# Patient Record
Sex: Male | Born: 1955 | Race: Black or African American | Hispanic: No | Smoking: Former smoker
Health system: Southern US, Community
[De-identification: ages and names within clinical notes are randomized; demographics above are authoritative.]

## PROBLEM LIST (undated history)

## (undated) DIAGNOSIS — M549 Dorsalgia, unspecified: Secondary | ICD-10-CM

## (undated) DIAGNOSIS — E785 Hyperlipidemia, unspecified: Secondary | ICD-10-CM

## (undated) DIAGNOSIS — E669 Obesity, unspecified: Secondary | ICD-10-CM

## (undated) DIAGNOSIS — N400 Enlarged prostate without lower urinary tract symptoms: Secondary | ICD-10-CM

## (undated) DIAGNOSIS — G4730 Sleep apnea, unspecified: Secondary | ICD-10-CM

## (undated) DIAGNOSIS — M109 Gout, unspecified: Secondary | ICD-10-CM

## (undated) DIAGNOSIS — R42 Dizziness and giddiness: Secondary | ICD-10-CM

## (undated) DIAGNOSIS — I10 Essential (primary) hypertension: Secondary | ICD-10-CM

## (undated) HISTORY — PX: BACK SURGERY: SHX140

## (undated) HISTORY — DX: Sleep apnea, unspecified: G47.30

---

## 2006-04-19 ENCOUNTER — Ambulatory Visit: Payer: Self-pay | Admitting: Psychiatry

## 2006-04-19 ENCOUNTER — Encounter: Payer: Self-pay | Admitting: Emergency Medicine

## 2006-04-19 ENCOUNTER — Inpatient Hospital Stay (HOSPITAL_COMMUNITY): Admission: RE | Admit: 2006-04-19 | Discharge: 2006-04-21 | Payer: Self-pay | Admitting: Psychiatry

## 2008-06-25 ENCOUNTER — Ambulatory Visit: Payer: Self-pay | Admitting: Internal Medicine

## 2008-06-25 ENCOUNTER — Ambulatory Visit: Payer: Self-pay | Admitting: Cardiology

## 2008-06-25 ENCOUNTER — Observation Stay (HOSPITAL_COMMUNITY): Admission: EM | Admit: 2008-06-25 | Discharge: 2008-06-27 | Payer: Self-pay | Admitting: Emergency Medicine

## 2008-06-26 ENCOUNTER — Encounter (INDEPENDENT_AMBULATORY_CARE_PROVIDER_SITE_OTHER): Payer: Self-pay | Admitting: Internal Medicine

## 2010-11-22 NOTE — Discharge Summary (Signed)
[name], PAINO NO.:  [id number]   MEDICAL RECORD NO.:  [id number]          PATIENT TYPE:  OBV   LOCATION:  6529                         FACILITY:  [facility]   PHYSICIAN:  Chauncey Reading, D.O.  DATE OF BIRTH:  [birth year]   DATE OF ADMISSION:  06/25/2008  DATE OF DISCHARGE:  06/27/2008                               DISCHARGE SUMMARY   DIAGNOSES AT DISCHARGE:  1. Atypical chest pain.  2. Hypertension.  3. Hyperlipidemia.  4. Questionable prediabetes.  5. Gout.   MEDICATIONS AT DISCHARGE:  1. Caduet 10/20 mg p.o. daily.  2. Aspirin 81 mg p.o. daily.  3. Prilosec 20 mg p.o. daily.   DISPOSITION AND FOLLOWUP:  The patient was advised to call his primary  care doctor, Dr. Yetta Flock and arrange an appointment for hospital  followup.  The patient will need to be reassessed as far as his chest  pain goes.  We thought that this was likely secondary to hiatal hernia  and possible GERD given positional nature, pain reoccurring while laying  in bed.  The patient also had a CT angiogram of his chest that showed a  small hiatal hernia that may be contributing.  The patient was  discharged on Prilosec to see if symptoms improve.  The patient was also  advised to take a daily aspirin even though he has got a recent stress  echo that was negative and was ruled out for acute coronary syndrome in  the hospital.  Furthermore, the patient should have his blood pressure  checked and make sure it is within goal.  Of note, the patient had a  hemoglobin A1c done in the hospital that was 6.0, a lipid panel that had  a total cholesterol of 142, triglycerides 119, HDL 35, and LDL of 83.  The patient should also followup with his rheumatologist that he had  been referred to by Dr. Yetta Flock himself, regarding the difficulty in  management of his gout of the left knee.   CONSULTATIONS:  None.   PROCEDURES:  The patient had a CT angiogram of the chest done June 26, 2008,  Impression;  1. No evidence of pulmonary emboli.  2. No abnormality of thoracic aorta.  3. Small hiatal hernia.   HISTORY AND PHYSICAL:  Ricky Bailey is a 55 year old male with past  medical history significant for hypertension, hyperlipidemia, and  questionable prediabetes who presents to the emergency department with  atypical chest pain.  For the past week and a half or so, he describes  chest pain only while laying down.  He described a pressure-like pain in  bilateral chest, worse with laying on either side.  Pain becomes more  substernal while laying on side.  The pain is not associated with any  other symptoms and is relieved by sitting or getting up.  Pain never  occurs while in an upright position.  He denies any other symptoms or  complaints.  Of note, he had a treadmill stress test, had a negative  stress echocardiogram earlier this year that was negative per patient  and it confirmed negative with his  PCP.   VITAL SIGNS:  On admission, blood pressure 153/94, heart rate 81,  respirations 16, temperature 98 degrees, and O2 sat 99% on room air.  GENERAL:  The patient was in no acute distress.  HEENT:  Sclerae was anicteric.  Pupils equal, round, and reactive to  light and accommodation.  Extraocular movements intact.  No conjunctival  injection.  NECK:  Supple, no bruits.  No lymphadenopathy.  No jugular venous  distention.  CARDIOVASCULAR:  Positive S1 and S2 with a regular rate and rhythm.  No  murmurs, gallops, or rubs.  LUNGS:  Clear to auscultation bilaterally.  Good air movement.  ABDOMEN:  Positive bowel sounds.  Soft, nontender, nondistended without  any guarding or rebound.  SKIN:  No significant rashes.  EXTREMITIES:  No lower extremity edema.  MUSCULOSKELETAL:  No calf tenderness.  No negative Homans sign.  NEUROLOGIC:  The patient was alert and oriented x3.  Cranial nerves II  through XII intact.  Motor 5/5 x4 extremities.  SENSORY:  Grossly intact.  Gait  normal.  PSYCHIATRIC:  The patient was pleasant and appropriate.   HOSPITAL COURSE:  1. Atypical chest pain.  Although atypical, the patient did have      plenty of cardiovascular risk factors including hypertension,      hyperlipidemia, and questionable prediabetes.  The patient was      admitted to a telemetry and ruled out for acute coronary syndrome.      EKGs remained unchanged throughout the hospitalization and cardiac      enzymes that were cycled were negative x3.  The patient did not      have any events on telemetry overnight.  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  Initial D-      dimer was 0.79 which is probably secondary to his gout and      inflammatory process.  However, given his unexplained chest pain      with an elevated D-dimer in the setting of a risk factor, we      proceeded with a CT angiogram of the chest which ruled out a      pulmonary emboli.  The CT angio also ruled out any thoracic aortic      pathology to explain the chest pain.  Furthermore, no lung or chest      pathology found to explain the chest pain except for a small hiatal      hernia which may be explaining the patient's symptoms along with      GERD.  The patient was continued on his home medications including      a calcium channel blocker and statins.  He was also placed on      aspirin here.  The patient was given Protonix for probable GERD.      The patient also had a 2-D echocardiogram with a normal left      ventricular function and an ejection fraction estimated at 55%.  Of      note, it was mentioned that the study was not adequate enough to      assess her wall motion abnormalities.  The patient was discharged      once all the life threatening causes of chest pain had been ruled      out.  Dr. Yetta Flock was called since  the patient reported having a      treadmill stress test  done earlier in the year.  Dr. Yetta Flock      confirmed that he had a stress echo done in March or April of 2009      and was negative for any ischemia.  The patient was discharged on      his home medications include Caduet and recommended daily aspirin      81 mg along with omeprazole for possible GERD.  He was also advised      to keep the head of the bed elevated and to not eat any meals      within 2-3 hours of his bedtime.  2. Hypertension.  He was continued on his home medications during the      hospitalization.  The patient is to have his blood pressure      reassessed on an outpatient basis to assess the need for further      adjustment.  3. Hyperlipidemia.  The patient had a fasting lipid panel done in the      hospital which included total cholesterol 142, triglycerides of      119, HDL of 35, and LDL of 83.  Given the patient's risk factors,      LDL is appropriate though a slight increase in the statin and      slight decrease in the LDL to 70 could be appropriate.  4. Questionable prediabetes.  The patient's hemoglobin A1c was checked      to assess for possible diabetes.  It was noted to be 6.0 which is      well within normal range.  The patient should have a continued      outpatient followup for possible development of diabetes.  5. Gout.  The patient did not have any gout flare during this      hospitalization.  His knee was noted to be slightly swollen, boggy,      and warm compared to the rest of the areas though there was no      exclusive tenderness.  The patient's PCP was contacted regarding      rheumatologic workup since the patient was not clear as to what was      going on.  Dr. Yetta Flock mentioned that the patient's knee has been      tapped on 2 different occasions, and uric acid and everything else      have been checked and it is consistent with gout.  The patient had      been referred to rheumatologist for difficulty controlling the gout      since he has  been tried on prednisone, indomethacin, allopurinol as      well as colchicine and neither of the combinations were able to      control his gout.  The patient's rheumatology appointment is      pending and the patient was advised to followup with that.      Mariea Stable, MD  Electronically Signed      Chauncey Reading, D.O.  Electronically Signed    MA/MEDQ  D:  06/27/2008  T:  06/28/2008  Job:  [id number]
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
## 2011-04-14 LAB — BASIC METABOLIC PANEL
BUN: 10 mg/dL (ref 6–23)
Chloride: 104 mEq/L (ref 96–112)
Creatinine, Ser: 0.81 mg/dL (ref 0.4–1.5)
Glucose, Bld: 108 mg/dL — ABNORMAL HIGH (ref 70–99)

## 2011-04-14 LAB — CK TOTAL AND CKMB (NOT AT ARMC)
CK, MB: 1.5 ng/mL (ref 0.3–4.0)
Relative Index: 1 (ref 0.0–2.5)

## 2011-04-14 LAB — LIPID PANEL
Cholesterol: 142 mg/dL (ref 0–200)
HDL: 35 mg/dL — ABNORMAL LOW (ref >39)
LDL Cholesterol: 83 mg/dL (ref 0–99)
Total CHOL/HDL Ratio: 4.1 RATIO
Triglycerides: 119 mg/dL (ref <150)

## 2011-04-14 LAB — DIFFERENTIAL
Lymphocytes Relative: 37 % (ref 12–46)
Lymphs Abs: 2.2 10*3/uL (ref 0.7–4.0)
Monocytes Relative: 8 % (ref 3–12)
Neutro Abs: 3.2 10*3/uL (ref 1.7–7.7)
Neutrophils Relative %: 53 % (ref 43–77)

## 2011-04-14 LAB — COMPREHENSIVE METABOLIC PANEL
Albumin: 4.3 g/dL (ref 3.5–5.2)
BUN: 9 mg/dL (ref 6–23)
Chloride: 105 mEq/L (ref 96–112)
Creatinine, Ser: 0.75 mg/dL (ref 0.4–1.5)
Glucose, Bld: 109 mg/dL — ABNORMAL HIGH (ref 70–99)
Total Bilirubin: 1.8 mg/dL — ABNORMAL HIGH (ref 0.3–1.2)
Total Protein: 7.6 g/dL (ref 6.0–8.3)

## 2011-04-14 LAB — HEMOGLOBIN A1C
Hgb A1c MFr Bld: 6 % (ref 4.6–6.1)
Mean Plasma Glucose: 126 mg/dL

## 2011-04-14 LAB — CBC
Hemoglobin: 13.5 g/dL (ref 13.0–17.0)
MCHC: 33.8 g/dL (ref 30.0–36.0)
MCV: 87.5 fL (ref 78.0–100.0)
Platelets: 269 10*3/uL (ref 150–400)
RBC: 4.58 MIL/uL (ref 4.22–5.81)
RDW: 14.2 % (ref 11.5–15.5)

## 2011-04-14 LAB — POCT I-STAT, CHEM 8
Chloride: 105 mEq/L (ref 96–112)
Creatinine, Ser: 0.9 mg/dL (ref 0.4–1.5)
Glucose, Bld: 114 mg/dL — ABNORMAL HIGH (ref 70–99)
Potassium: 3.4 mEq/L — ABNORMAL LOW (ref 3.5–5.1)

## 2011-04-14 LAB — CARDIAC PANEL(CRET KIN+CKTOT+MB+TROPI)
Total CK: 128 U/L (ref 7–232)
Troponin I: <0.01 ng/mL (ref 0.00–0.06)
Troponin I: <0.01 ng/mL (ref 0.00–0.06)

## 2011-04-14 LAB — POCT CARDIAC MARKERS
CKMB, poc: 1.1 ng/mL (ref 1.0–8.0)
Troponin i, poc: <0.05 ng/mL (ref 0.00–0.09)

## 2011-04-14 LAB — D-DIMER, QUANTITATIVE: D-Dimer, Quant: 0.79 ug/mL-FEU — ABNORMAL HIGH (ref 0.00–0.48)

## 2011-04-14 LAB — TSH: TSH: 1.122 u[IU]/mL (ref 0.350–4.500)

## 2011-06-15 ENCOUNTER — Ambulatory Visit: Payer: Self-pay

## 2012-06-19 ENCOUNTER — Emergency Department (HOSPITAL_COMMUNITY)
Admission: EM | Admit: 2012-06-19 | Discharge: 2012-06-19 | Disposition: A | Discharge status: Home / Self Care | Payer: Worker's Compensation | Attending: Emergency Medicine | Admitting: Emergency Medicine

## 2012-06-19 ENCOUNTER — Encounter (HOSPITAL_COMMUNITY): Payer: Self-pay | Admitting: Family Medicine

## 2012-06-19 ENCOUNTER — Emergency Department (HOSPITAL_COMMUNITY): Payer: Worker's Compensation

## 2012-06-19 DIAGNOSIS — Y9389 Activity, other specified: Secondary | ICD-10-CM | POA: Insufficient documentation

## 2012-06-19 DIAGNOSIS — Y929 Unspecified place or not applicable: Secondary | ICD-10-CM | POA: Insufficient documentation

## 2012-06-19 DIAGNOSIS — I10 Essential (primary) hypertension: Secondary | ICD-10-CM | POA: Insufficient documentation

## 2012-06-19 DIAGNOSIS — Z87891 Personal history of nicotine dependence: Secondary | ICD-10-CM | POA: Insufficient documentation

## 2012-06-19 DIAGNOSIS — S239XXA Sprain of unspecified parts of thorax, initial encounter: Secondary | ICD-10-CM | POA: Insufficient documentation

## 2012-06-19 DIAGNOSIS — E785 Hyperlipidemia, unspecified: Secondary | ICD-10-CM | POA: Insufficient documentation

## 2012-06-19 DIAGNOSIS — T148XXA Other injury of unspecified body region, initial encounter: Secondary | ICD-10-CM

## 2012-06-19 DIAGNOSIS — W010XXA Fall on same level from slipping, tripping and stumbling without subsequent striking against object, initial encounter: Secondary | ICD-10-CM | POA: Insufficient documentation

## 2012-06-19 HISTORY — DX: Hyperlipidemia, unspecified: E78.5

## 2012-06-19 HISTORY — DX: Essential (primary) hypertension: I10

## 2012-06-19 MED ORDER — IBUPROFEN 800 MG PO TABS
800.0000 mg | ORAL_TABLET | Freq: Once | ORAL | Status: AC
  Administered 2012-06-19: 800 mg via ORAL
  Filled 2012-06-19: qty 1

## 2012-06-19 MED ORDER — HYDROCODONE-ACETAMINOPHEN 5-325 MG PO TABS: 2.0000 | ORAL_TABLET | ORAL | Status: DC | PRN

## 2012-06-19 MED ORDER — IBUPROFEN 800 MG PO TABS: 800.0000 mg | ORAL_TABLET | Freq: Three times a day (TID) | ORAL | Status: DC

## 2012-06-19 MED ORDER — HYDROCODONE-ACETAMINOPHEN 5-325 MG PO TABS
2.0000 | ORAL_TABLET | Freq: Once | ORAL | Status: AC
  Administered 2012-06-19: 2 via ORAL
  Filled 2012-06-19: qty 2

## 2012-06-19 NOTE — ED Notes (Signed)
Pt presents with lower back pain and left-sided rib pain. Pt reports slipping on ice while getting out of a truck and caught himself with the door.  Pt denies numbness/tingling, denies bowel/bladder incontinence.  Pt c/o 7/10 back pain and 7/10 rib pain on inspiration. Pt denies SOB at this time.

## 2012-06-19 NOTE — ED Notes (Signed)
Patient transported to X-ray 

## 2012-06-19 NOTE — ED Provider Notes (Signed)
History     CSN: [id number]  Arrival date & time 06/19/12  0550   None     Chief Complaint  Patient presents with  . Back Pain    (Consider location/radiation/quality/duration/timing/severity/associated sxs/prior treatment) Patient is a 56 y.o. male presenting with back pain. The history is provided by the patient. No language interpreter was used.  Back Pain  This is a new problem. The problem occurs constantly. The problem has been gradually worsening. The pain is associated with falling. The pain is present in the thoracic spine. The quality of the pain is described as aching. The pain is at a severity of 6/10. The pain is moderate. The symptoms are aggravated by bending. The pain is the same all the time. Stiffness is present all day. Associated symptoms include chest pain. He has tried nothing for the symptoms.  Pt reports he slipped on ice getting into his truck,  Pt reports he caught himself and had a pulling sensation in chest and back  Past Medical History  Diagnosis Date  . Hypertension   . Hyperlipidemia     History reviewed. No pertinent past surgical history.  No family history on file.  History  Substance Use Topics  . Smoking status: Former Smoker    Quit date: 07/21/1977  . Smokeless tobacco: Not on file  . Alcohol Use: 3.6 oz/week    6 Cans of beer per week      Review of Systems  Cardiovascular: Positive for chest pain.  Musculoskeletal: Positive for back pain.  All other systems reviewed and are negative.    Allergies  Review of patient's allergies indicates no known allergies.  Home Medications   Current Outpatient Rx  Name  Route  Sig  Dispense  Refill  . AMLODIPINE-ATORVASTATIN 10-40 MG PO TABS   Oral   Take 1 tablet by mouth daily.           BP 123/78  Pulse 62  Temp 97.3 F (36.3 C) (Oral)  Resp 20  SpO2 100%  Physical Exam  Vitals reviewed. Constitutional: He is oriented to person, place, and time. He appears  well-developed and well-nourished.  HENT:  Head: Normocephalic.  Right Ear: External ear normal.  Eyes: Conjunctivae normal are normal. Pupils are equal, round, and reactive to light.  Neck: Normal range of motion. Neck supple.  Cardiovascular: Normal rate and normal heart sounds.   Pulmonary/Chest: Effort normal and breath sounds normal.  Abdominal: Soft.  Musculoskeletal: Normal range of motion.  Neurological: He is alert and oriented to person, place, and time. He has normal reflexes.  Skin: Skin is warm.  Psychiatric: He has a normal mood and affect.    ED Course  Procedures (including critical care time)  Labs Reviewed - No data to display No results found.   No diagnosis found.    MDM      Chest xray no acute    Lonia Skinner [place], [place] 06/19/12 [phone]

## 2012-06-19 NOTE — ED Provider Notes (Signed)
Medical screening examination/treatment/procedure(s) were performed by non-physician practitioner and as supervising physician I was immediately available for consultation/collaboration.   Lyanne Co, MD 06/19/12 [phone]

## 2014-06-12 ENCOUNTER — Emergency Department (HOSPITAL_COMMUNITY)
Admission: EM | Admit: 2014-06-12 | Discharge: 2014-06-12 | Disposition: A | Discharge status: Home / Self Care | Attending: Emergency Medicine | Admitting: Emergency Medicine

## 2014-06-12 ENCOUNTER — Encounter (HOSPITAL_COMMUNITY): Payer: Self-pay

## 2014-06-12 DIAGNOSIS — Z87891 Personal history of nicotine dependence: Secondary | ICD-10-CM | POA: Insufficient documentation

## 2014-06-12 DIAGNOSIS — I10 Essential (primary) hypertension: Secondary | ICD-10-CM | POA: Diagnosis not present

## 2014-06-12 DIAGNOSIS — R109 Unspecified abdominal pain: Secondary | ICD-10-CM | POA: Diagnosis not present

## 2014-06-12 DIAGNOSIS — Z8639 Personal history of other endocrine, nutritional and metabolic disease: Secondary | ICD-10-CM | POA: Diagnosis not present

## 2014-06-12 DIAGNOSIS — Z79899 Other long term (current) drug therapy: Secondary | ICD-10-CM | POA: Insufficient documentation

## 2014-06-12 LAB — URINALYSIS, ROUTINE W REFLEX MICROSCOPIC
Bilirubin Urine: NEGATIVE
Glucose, UA: NEGATIVE mg/dL
HGB URINE DIPSTICK: NEGATIVE
Ketones, ur: NEGATIVE mg/dL
Leukocytes, UA: NEGATIVE
NITRITE: NEGATIVE
Protein, ur: NEGATIVE mg/dL
SPECIFIC GRAVITY, URINE: 1.016 (ref 1.005–1.030)
UROBILINOGEN UA: 0.2 mg/dL (ref 0.0–1.0)
pH: 5.5 (ref 5.0–8.0)

## 2014-06-12 LAB — CBC WITH DIFFERENTIAL/PLATELET
BASOS PCT: 0 % (ref 0–1)
Basophils Absolute: 0 10*3/uL (ref 0.0–0.1)
Eosinophils Absolute: 0.2 10*3/uL (ref 0.0–0.7)
Eosinophils Relative: 5 % (ref 0–5)
HCT: 41.2 % (ref 39.0–52.0)
HEMOGLOBIN: 13.9 g/dL (ref 13.0–17.0)
Lymphocytes Relative: 42 % (ref 12–46)
Lymphs Abs: 2 10*3/uL (ref 0.7–4.0)
MCH: 30.2 pg (ref 26.0–34.0)
MCHC: 33.7 g/dL (ref 30.0–36.0)
MCV: 89.6 fL (ref 78.0–100.0)
MONOS PCT: 7 % (ref 3–12)
Monocytes Absolute: 0.4 10*3/uL (ref 0.1–1.0)
NEUTROS ABS: 2.2 10*3/uL (ref 1.7–7.7)
NEUTROS PCT: 46 % (ref 43–77)
Platelets: 183 10*3/uL (ref 150–400)
RBC: 4.6 MIL/uL (ref 4.22–5.81)
RDW: 13.9 % (ref 11.5–15.5)
WBC: 4.8 10*3/uL (ref 4.0–10.5)

## 2014-06-12 LAB — BASIC METABOLIC PANEL
ANION GAP: 14 (ref 5–15)
BUN: 11 mg/dL (ref 6–23)
CHLORIDE: 104 meq/L (ref 96–112)
CO2: 22 mEq/L (ref 19–32)
CREATININE: 0.95 mg/dL (ref 0.50–1.35)
Calcium: 8.7 mg/dL (ref 8.4–10.5)
GFR calc non Af Amer: >90 mL/min (ref >90)
Glucose, Bld: 137 mg/dL — ABNORMAL HIGH (ref 70–99)
POTASSIUM: 3.9 meq/L (ref 3.7–5.3)
SODIUM: 140 meq/L (ref 137–147)

## 2014-06-12 MED ORDER — OXYCODONE-ACETAMINOPHEN 5-325 MG PO TABS: 1.0000 | ORAL_TABLET | ORAL | Status: DC | PRN

## 2014-06-12 MED ORDER — OXYCODONE-ACETAMINOPHEN 5-325 MG PO TABS
2.0000 | ORAL_TABLET | Freq: Once | ORAL | Status: AC
Start: 2014-06-12 — End: 2014-06-12
  Administered 2014-06-12: 2 via ORAL

## 2014-06-12 NOTE — ED Notes (Signed)
Pt states he has been having R sided flank pain x 3 weeks, denies dysuria, denies trauma.

## 2014-06-12 NOTE — ED Provider Notes (Signed)
CSN: [id number]     Arrival date & time 06/12/14  0543 History   First MD Initiated Contact with Patient 06/12/14 0602     Chief Complaint  Patient presents with  . Flank Pain     (Consider location/radiation/quality/duration/timing/severity/associated sxs/prior Treatment) The history is provided by the patient and medical records.    This is a 58 y.o. M with PMH significant for HTN and HLP, presenting to the ED for right flank pain.  Patient states this initially started approx 2-3 weeks ago.  States he has intermittent pain, sometimes sharp and sometimes dull, but never with radiation.  States occasionally he does have difficulty findings a comfortable position when apin occurs.  He denies associated nausea, vomiting, diarrhea.  No urinary symptoms, bowel movements normal.  No fever, sweats, chills.  No prior hx of kidney stones.  Denies injuries or trauma to flank.  No low back pain, pain of LE, numbness, or weakness.  VS stable on arrival.  Past Medical History  Diagnosis Date  . Hypertension   . Hyperlipidemia    History reviewed. No pertinent past surgical history. No family history on file. History  Substance Use Topics  . Smoking status: Former Smoker    Quit date: 07/21/1977  . Smokeless tobacco: Not on file  . Alcohol Use: 3.6 oz/week    6 Cans of beer per week    Review of Systems  Genitourinary: Positive for flank pain.  All other systems reviewed and are negative.     Allergies  Review of patient's allergies indicates no known allergies.  Home Medications   Prior to Admission medications   Medication Sig Start Date End Date Taking? Authorizing Provider  amLODipine-atorvastatin (CADUET) 10-40 MG per tablet Take 1 tablet by mouth daily.   Yes Historical Provider, MD  HYDROcodone-acetaminophen (NORCO/VICODIN) 5-325 MG per tablet Take 2 tablets by mouth every 4 (four) hours as needed for pain. Patient not taking: Reported on 06/12/2014 06/19/12   Elson AreasLeslie K Sofia,  PA-C  ibuprofen (ADVIL,MOTRIN) 800 MG tablet Take 1 tablet (800 mg total) by mouth 3 (three) times daily. Patient not taking: Reported on 06/12/2014 06/19/12   Lonia SkinnerLeslie K Sofia, PA-C   BP 160/93 mmHg  Pulse 59  Temp(Src) 98.2 F (36.8 C)  Resp 20  Ht 5\' 11"  (1.803 m)  Wt 255 lb (115.667 kg)  BMI 35.58 kg/m2  SpO2 100%   Physical Exam  Constitutional: He is oriented to person, place, and time. He appears well-developed and well-nourished. No distress.  Lying comfortably in bed, NAD  HENT:  Head: Normocephalic and atraumatic.  Mouth/Throat: Oropharynx is clear and moist.  Eyes: Conjunctivae and EOM are normal. Pupils are equal, round, and reactive to light.  Neck: Normal range of motion.  Cardiovascular: Normal rate, regular rhythm and normal heart sounds.   Pulmonary/Chest: Effort normal and breath sounds normal. No respiratory distress. He has no wheezes.  Abdominal: Soft. Bowel sounds are normal. There is no tenderness. There is no guarding and no CVA tenderness.  Abdomen soft, nondistended, no focal tenderness or peritoneal signs Endorses aching pain in his right flank, no focal tenderness  Musculoskeletal: Normal range of motion.  Neurological: He is alert and oriented to person, place, and time.  Skin: Skin is warm and dry. He is not diaphoretic.  Psychiatric: He has a normal mood and affect.  Nursing note and vitals reviewed.   ED Course  Procedures (including critical care time) Labs Review Labs Reviewed  BASIC METABOLIC PANEL -  Abnormal; Notable for the following:    Glucose, Bld 137 (*)    All other components within normal limits  URINALYSIS, ROUTINE W REFLEX MICROSCOPIC - Abnormal; Notable for the following:    APPearance CLOUDY (*)    All other components within normal limits  CBC WITH DIFFERENTIAL    Imaging Review No results found.   EKG Interpretation None      MDM   Final diagnoses:  Flank pain   58 year old male with right flank pain for the  past 3 weeks without associated urinary symptoms, nausea, or vomiting. On exam, patient afebrile and in no acute distress. He has no focal tenderness of his right flank, but endorses an aching pain. He has no prior history of kidney stones. Will obtain basic labs and urinalysis. Percocet given for pain.  Labwork reassuring. UA noninfectious, no hematuria to suggest kidney stones. Patient states Percocet has significantly helped his pain.  At this time, low suspicion for acute abdomino-pelvic pathology including kidney stones, infected stone, urinary obstruction, appendicitis, SBO, bowel perforation, etc and do not feel that patient needs emergent imaging.  Will treat symptomatically for now with percocet.  Encouraged close FU with PCP-- patient understands that there is still a possibility he could have renal stones and if no improvement he may need CT for further evaluation but he is comfortable holding off on this for now.  Discussed plan with patient, he/she acknowledged understanding and agreed with plan of care.  Return precautions given for new or worsening symptoms.  Garlon HatchetLisa M Marbella Markgraf, PA-C 06/12/14 1343  Olivia Mackielga M Otter, MD 06/13/14 [phone]

## 2014-06-12 NOTE — Discharge Instructions (Signed)
Take the prescribed medication as directed. Follow-up with your primary care physician. Return to the ED for new or worsening symptoms-- increasing pain, difficulty/pain urination, blood in urine, nausea, vomiting, etc.

## 2014-11-28 ENCOUNTER — Encounter (HOSPITAL_COMMUNITY): Payer: Self-pay | Admitting: Emergency Medicine

## 2014-11-28 ENCOUNTER — Emergency Department (HOSPITAL_COMMUNITY)
Admission: EM | Admit: 2014-11-28 | Discharge: 2014-11-28 | Disposition: A | Discharge status: Home / Self Care | Attending: Emergency Medicine | Admitting: Emergency Medicine

## 2014-11-28 DIAGNOSIS — M25462 Effusion, left knee: Secondary | ICD-10-CM | POA: Insufficient documentation

## 2014-11-28 DIAGNOSIS — Z79899 Other long term (current) drug therapy: Secondary | ICD-10-CM | POA: Diagnosis not present

## 2014-11-28 DIAGNOSIS — Z87891 Personal history of nicotine dependence: Secondary | ICD-10-CM | POA: Insufficient documentation

## 2014-11-28 DIAGNOSIS — Z8639 Personal history of other endocrine, nutritional and metabolic disease: Secondary | ICD-10-CM | POA: Diagnosis not present

## 2014-11-28 DIAGNOSIS — M109 Gout, unspecified: Secondary | ICD-10-CM

## 2014-11-28 DIAGNOSIS — M10062 Idiopathic gout, left knee: Secondary | ICD-10-CM | POA: Insufficient documentation

## 2014-11-28 DIAGNOSIS — I10 Essential (primary) hypertension: Secondary | ICD-10-CM | POA: Insufficient documentation

## 2014-11-28 MED ORDER — INDOMETHACIN 25 MG PO CAPS
50.0000 mg | ORAL_CAPSULE | Freq: Once | ORAL | Status: AC
  Administered 2014-11-28: 50 mg via ORAL
  Filled 2014-11-28: qty 2

## 2014-11-28 MED ORDER — OXYCODONE-ACETAMINOPHEN 5-325 MG PO TABS: 1.0000 | ORAL_TABLET | Freq: Four times a day (QID) | ORAL | Status: DC | PRN

## 2014-11-28 MED ORDER — OXYCODONE-ACETAMINOPHEN 5-325 MG PO TABS
1.0000 | ORAL_TABLET | Freq: Once | ORAL | Status: AC
  Administered 2014-11-28: 1 via ORAL
  Filled 2014-11-28: qty 1

## 2014-11-28 MED ORDER — INDOMETHACIN 50 MG PO CAPS: 50.0000 mg | ORAL_CAPSULE | Freq: Three times a day (TID) | ORAL | Status: DC

## 2014-11-28 NOTE — ED Provider Notes (Signed)
CSN: [id number]     Arrival date & time 11/28/14  0424 History   First MD Initiated Contact with Patient 11/28/14 [phone]     Chief Complaint  Patient presents with  . Gout     (Consider location/radiation/quality/duration/timing/severity/associated sxs/prior Treatment) HPI  This is a 59 year old male who presents with left knee pain. Patient reports history of gout. His gout normally clears in his toe; however, on Monday he noted pain and warmth to his left knee. He states that the pain is worse with movement. Denies any fevers. Has noted swelling. Saw his primary care physician on Wednesday and was given colchicine. He has taken colchicine both Wednesday and Friday at the recommended dose for acute flare but has had only minimal relief of his pain. Has not been taking any anti-inflammatories. Current pain is 6 out of 10. He is a Naval architecttruck driver. He denies any calf swelling or tenderness.  Past Medical History  Diagnosis Date  . Hypertension   . Hyperlipidemia    History reviewed. No pertinent past surgical history. No family history on file. History  Substance Use Topics  . Smoking status: Former Smoker    Quit date: 07/21/1977  . Smokeless tobacco: Not on file  . Alcohol Use: 3.6 oz/week    6 Cans of beer per week    Review of Systems  Constitutional: Negative for fever.  Musculoskeletal: Positive for joint swelling.       Left knee pain  Skin: Negative for color change and rash.  All other systems reviewed and are negative.     Allergies  Review of patient's allergies indicates no known allergies.  Home Medications   Prior to Admission medications   Medication Sig Start Date End Date Taking? Authorizing Provider  amLODipine-atorvastatin (CADUET) 10-40 MG per tablet Take 1 tablet by mouth daily.    Historical Provider, MD  HYDROcodone-acetaminophen (NORCO/VICODIN) 5-325 MG per tablet Take 2 tablets by mouth every 4 (four) hours as needed for pain. Patient not taking:  Reported on 06/12/2014 06/19/12   Elson AreasLeslie K Sofia, PA-C  ibuprofen (ADVIL,MOTRIN) 800 MG tablet Take 1 tablet (800 mg total) by mouth 3 (three) times daily. Patient not taking: Reported on 06/12/2014 06/19/12   Elson AreasLeslie K Sofia, PA-C  indomethacin (INDOCIN) 50 MG capsule Take 1 capsule (50 mg total) by mouth 3 (three) times daily with meals. 11/28/14   Shon Batonourtney F Horton, MD  oxyCODONE-acetaminophen (PERCOCET/ROXICET) 5-325 MG per tablet Take 1 tablet by mouth every 6 (six) hours as needed for severe pain. 11/28/14   Shon Batonourtney F Horton, MD   BP 139/94 mmHg  Pulse 74  Temp(Src) 97.7 F (36.5 C) (Oral)  Resp 20  Ht 5\' 11"  (1.803 m)  Wt 250 lb (113.399 kg)  BMI 34.88 kg/m2  SpO2 98% Physical Exam  Constitutional: He is oriented to person, place, and time. He appears well-developed and well-nourished.  HENT:  Head: Normocephalic and atraumatic.  Cardiovascular: Normal rate and regular rhythm.   Pulmonary/Chest: Effort normal. No respiratory distress.  Musculoskeletal: He exhibits no edema.  Tenderness to palpation and warmth over the left knee, knee effusion noted, no overlying skin changes, normal passive and active range of motion of the knee, no calf tenderness  Neurological: He is alert and oriented to person, place, and time.  Skin: Skin is warm and dry.  Psychiatric: He has a normal mood and affect.  Nursing note and vitals reviewed.   ED Course  Procedures (including critical care time) Labs Review Labs Reviewed -  No data to display  Imaging Review No results found.   EKG Interpretation None      MDM   Final diagnoses:  Acute gout of left knee, unspecified cause   Patient presents with pain in the left knee. Most consistent with gout. Low suspicion at this time for septic joint. Patient is nontoxic-appearing and afebrile. He has no contraindications to NSAIDs. BMP reviewed from December with normal creatinine and creatinine clearance. Patient given indomethacin and Percocet.  Will discharge home with 5 days of indomethacin and a short course of Percocet for pain. Patient follow-up with his primary physician on Monday.  After history, exam, and medical workup I feel the patient has been appropriately medically screened and is safe for discharge home. Pertinent diagnoses were discussed with the patient. Patient was given return precautions.     Shon Baton, MD 11/28/14 [phone]

## 2014-11-28 NOTE — ED Notes (Signed)
Gout flare up since Monday, has been taken colchicine but has ran out.  Pain is worse with movement, pain located in left knee and it appears swollen.

## 2014-11-28 NOTE — Discharge Instructions (Signed)

## 2014-12-18 ENCOUNTER — Emergency Department (HOSPITAL_COMMUNITY)
Admission: EM | Admit: 2014-12-18 | Discharge: 2014-12-18 | Disposition: A | Discharge status: Home / Self Care | Attending: Emergency Medicine | Admitting: Emergency Medicine

## 2014-12-18 ENCOUNTER — Encounter (HOSPITAL_COMMUNITY): Payer: Self-pay

## 2014-12-18 DIAGNOSIS — I10 Essential (primary) hypertension: Secondary | ICD-10-CM | POA: Insufficient documentation

## 2014-12-18 DIAGNOSIS — Z79899 Other long term (current) drug therapy: Secondary | ICD-10-CM | POA: Insufficient documentation

## 2014-12-18 DIAGNOSIS — M25562 Pain in left knee: Secondary | ICD-10-CM | POA: Diagnosis present

## 2014-12-18 DIAGNOSIS — E785 Hyperlipidemia, unspecified: Secondary | ICD-10-CM | POA: Diagnosis not present

## 2014-12-18 DIAGNOSIS — M25462 Effusion, left knee: Secondary | ICD-10-CM | POA: Diagnosis not present

## 2014-12-18 DIAGNOSIS — Z87891 Personal history of nicotine dependence: Secondary | ICD-10-CM | POA: Insufficient documentation

## 2014-12-18 DIAGNOSIS — M10062 Idiopathic gout, left knee: Secondary | ICD-10-CM | POA: Insufficient documentation

## 2014-12-18 DIAGNOSIS — M109 Gout, unspecified: Secondary | ICD-10-CM

## 2014-12-18 MED ORDER — HYDROCODONE-ACETAMINOPHEN 5-325 MG PO TABS: 1.0000 | ORAL_TABLET | ORAL | Status: DC | PRN

## 2014-12-18 MED ORDER — INDOMETHACIN 50 MG PO CAPS: 50.0000 mg | ORAL_CAPSULE | Freq: Three times a day (TID) | ORAL | Status: DC

## 2014-12-18 NOTE — Discharge Instructions (Signed)
Gout Gout is an inflammatory arthritis caused by a buildup of uric acid crystals in the joints. Uric acid is a chemical that is normally present in the blood. When the level of uric acid in the blood is too high it can form crystals that deposit in your joints and tissues. This causes joint redness, soreness, and swelling (inflammation). Repeat attacks are common. Over time, uric acid crystals can form into masses (tophi) near a joint, destroying bone and causing disfigurement. Gout is treatable and often preventable. CAUSES  The disease begins with elevated levels of uric acid in the blood. Uric acid is produced by your body when it breaks down a naturally found substance called purines. Certain foods you eat, such as meats and fish, contain high amounts of purines. Causes of an elevated uric acid level include:  Being passed down from parent to child (heredity).  Diseases that cause increased uric acid production (such as obesity, psoriasis, and certain cancers).  Excessive alcohol use.  Diet, especially diets rich in meat and seafood.  Medicines, including certain cancer-fighting medicines (chemotherapy), water pills (diuretics), and aspirin.  Chronic kidney disease. The kidneys are no longer able to remove uric acid well.  Problems with metabolism. Conditions strongly associated with gout include:  Obesity.  High blood pressure.  High cholesterol.  Diabetes. Not everyone with elevated uric acid levels gets gout. It is not understood why some people get gout and others do not. Surgery, joint injury, and eating too much of certain foods are some of the factors that can lead to gout attacks. SYMPTOMS   An attack of gout comes on quickly. It causes intense pain with redness, swelling, and warmth in a joint.  Fever can occur.  Often, only one joint is involved. Certain joints are more commonly involved:  Base of the big toe.  Knee.  Ankle.  Wrist.  Finger. Without  treatment, an attack usually goes away in a few days to weeks. Between attacks, you usually will not have symptoms, which is different from many other forms of arthritis. DIAGNOSIS  Your caregiver will suspect gout based on your symptoms and exam. In some cases, tests may be recommended. The tests may include:  Blood tests.  Urine tests.  X-rays.  Joint fluid exam. This exam requires a needle to remove fluid from the joint (arthrocentesis). Using a microscope, gout is confirmed when uric acid crystals are seen in the joint fluid. TREATMENT  There are two phases to gout treatment: treating the sudden onset (acute) attack and preventing attacks (prophylaxis).  Treatment of an Acute Attack.  Medicines are used. These include anti-inflammatory medicines or steroid medicines.  An injection of steroid medicine into the affected joint is sometimes necessary.  The painful joint is rested. Movement can worsen the arthritis.  You may use warm or cold treatments on painful joints, depending which works best for you.  Treatment to Prevent Attacks.  If you suffer from frequent gout attacks, your caregiver may advise preventive medicine. These medicines are started after the acute attack subsides. These medicines either help your kidneys eliminate uric acid from your body or decrease your uric acid production. You may need to stay on these medicines for a very long time.  The early phase of treatment with preventive medicine can be associated with an increase in acute gout attacks. For this reason, during the first few months of treatment, your caregiver may also advise you to take medicines usually used for acute gout treatment. Be sure you  understand your caregiver's directions. Your caregiver may make several adjustments to your medicine dose before these medicines are effective.  Discuss dietary treatment with your caregiver or dietitian. Alcohol and drinks high in sugar and fructose and foods  such as meat, poultry, and seafood can increase uric acid levels. Your caregiver or dietitian can advise you on drinks and foods that should be limited. HOME CARE INSTRUCTIONS   Do not take aspirin to relieve pain. This raises uric acid levels.  Only take over-the-counter or prescription medicines for pain, discomfort, or fever as directed by your caregiver.  Rest the joint as much as possible. When in bed, keep sheets and blankets off painful areas.  Keep the affected joint raised (elevated).  Apply warm or cold treatments to painful joints. Use of warm or cold treatments depends on which works best for you.  Use crutches if the painful joint is in your leg.  Drink enough fluids to keep your urine clear or pale yellow. This helps your body get rid of uric acid. Limit alcohol, sugary drinks, and fructose drinks.  Follow your dietary instructions. Pay careful attention to the amount of protein you eat. Your daily diet should emphasize fruits, vegetables, whole grains, and fat-free or low-fat milk products. Discuss the use of coffee, vitamin C, and cherries with your caregiver or dietitian. These may be helpful in lowering uric acid levels.  Maintain a healthy body weight. SEEK MEDICAL CARE IF:   You develop diarrhea, vomiting, or any side effects from medicines.  You do not feel better in 24 hours, or you are getting worse. SEEK IMMEDIATE MEDICAL CARE IF:   Your joint becomes suddenly more tender, and you have chills or a fever. MAKE SURE YOU:   Understand these instructions.  Will watch your condition.  Will get help right away if you are not doing well or get worse. Document Released: 06/23/2000 Document Revised: 11/10/2013 Document Reviewed: 02/07/2012 [facility] Patient Information 2015 [place], [place]. This information is not intended to replace advice given to you by your health care provider. Make sure you discuss any questions you have with your health care provider. Diet  guidelines for patients with gout have changed over time, and it is not completely clear which combination of foods is best.  You are encouraged to eat and drink:  ?Low-fat dairy products ?Foods made with complex carbohydrates (whole grains, brown rice, oats, beans) ?Only a moderate amount of wine (up to two 5-ounce servings per day [about 300 mL per day] may be acceptable, unless the individual patient has found that this increases their risk of a gout attack) ?Coffee (may decrease serum uric acid levels) ?Vitamin C (500 mg per day has a mild urate-lowering effect) Changes in diet are often recommended along with medications. Diet change alone is unlikely to lower blood urate levels by more than about 15 percent, even if the diet is severely restricted. On the other hand, when diet control is accompanied by weight loss (often with increased exercise), improvements in urate control can be more impressive.

## 2014-12-18 NOTE — ED Notes (Signed)
Pt complaining of R knee pain. Complaining of swelling to R knee that started 2 weeks ago. 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

## 2014-12-18 NOTE — ED Provider Notes (Signed)
CSN: [id number]     Arrival date & time 12/18/14  1900 History  This chart was scribed for non-physician practitioner Ricky Farrier PA-C working with Ricky Canal, MD by Lyndel Safe, ED Scribe. This patient was seen in room TR08C/TR08C and the patient's care was started at 7:09 PM.   Chief Complaint  Patient presents with  . Knee Pain   The history is provided by the patient and the spouse. No language interpreter was used.   HPI Comments: Ricky Bailey is a 59 y.o. male, with a PMhx of gout, HTN, and HLD, who presents to the Emergency Department complaining of constant, moderate, gradually worsening, 8/10, left knee pain that is exacerbated on movement onset 3 days ago. There is associated swelling to his left knee. Pt states this pain is the same as the pain he has felt in his previous gout flare ups. He states he consumes alcohol. Pt is a Naval architect by profession.  He is followed by Dr. Charlott Rakes, his PCP, with whom he tried to make an appt with today but due to it being after hours he was unable to. He has not followed up with his PCP after previous gout episodes and visits to the ED. Pt was seen in the ED on 5/21 for same complaint of left knee pain and given indomethacin and Percocet which he reports resolved his gout flair at that time. It returned 3 days ago.  He denies recent medication changes. Also denies BLE numbness or tingling, edema, chest pain, SOB, cough, wheezing, fever, chills, any recent falls or injury, radiating pain down left leg, history of blood clots/PE/DVT.    Past Medical History  Diagnosis Date  . Hypertension   . Hyperlipidemia    History reviewed. No pertinent past surgical history. History reviewed. No pertinent family history. History  Substance Use Topics  . Smoking status: Former Smoker    Quit date: 07/21/1977  . Smokeless tobacco: Not on file  . Alcohol Use: 3.6 oz/week    6 Cans of beer per week    Review of Systems  Constitutional:  Negative for fever and chills.  Respiratory: Negative for cough, shortness of breath and wheezing.   Cardiovascular: Negative for chest pain and leg swelling.  Gastrointestinal: Negative for nausea, vomiting and diarrhea.  Musculoskeletal: Positive for joint swelling ( left knee) and arthralgias ( left knee).  Skin: Negative for rash and wound.  Neurological: Negative for weakness and numbness.   Allergies  Review of patient's allergies indicates no known allergies.  Home Medications   Prior to Admission medications   Medication Sig Start Date End Date Taking? Authorizing Provider  amLODipine-atorvastatin (CADUET) 10-40 MG per tablet Take 1 tablet by mouth daily.    Historical Provider, MD  HYDROcodone-acetaminophen (NORCO/VICODIN) 5-325 MG per tablet Take 1 tablet by mouth every 4 (four) hours as needed. 12/18/14   Ricky Farrier, PA-C  ibuprofen (ADVIL,MOTRIN) 800 MG tablet Take 1 tablet (800 mg total) by mouth 3 (three) times daily. Patient not taking: Reported on 06/12/2014 06/19/12   Elson Areas, PA-C  indomethacin (INDOCIN) 50 MG capsule Take 1 capsule (50 mg total) by mouth 3 (three) times daily with meals. 12/18/14   Ricky Farrier, PA-C   BP 137/96 mmHg  Pulse 75  Temp(Src) 98.2 F (36.8 C) (Oral)  Resp 16  SpO2 99%   Physical Exam  Constitutional: He appears well-developed and well-nourished. No distress.  Nontoxic appearing.  HENT:  Head: Normocephalic and atraumatic.  Eyes:  Right eye exhibits no discharge. Left eye exhibits no discharge.  Neck: Neck supple.  Cardiovascular: Normal rate, regular rhythm, normal heart sounds and intact distal pulses.   Bilateral radial, posterior tibialis and dorsalis pedis pulses are intact.  Pulmonary/Chest: Effort normal and breath sounds normal. No respiratory distress. He has no wheezes. He has no rales.  Abdominal: Soft. He exhibits no distension. There is no tenderness.  Musculoskeletal: He exhibits no tenderness.  Tenderness  to palpation and warmth over the left knee with some very mild knee effusion noted. There is no overlying skin changes. She has normal active and passive range of motion of the left knee.  No left calf tenderness, or edema.   Dorsalis pedis and posterior tibialis bilaterally intact.  Bilateral calves are equal in size and are non-tender.   Lymphadenopathy:    He has no cervical adenopathy.  Neurological: He is alert. Coordination normal.  Skin: Skin is warm and dry. No rash noted. He is not diaphoretic.  Psychiatric: He has a normal mood and affect. His behavior is normal.  Nursing note and vitals reviewed.  ED Course  Procedures  DIAGNOSTIC STUDIES: Oxygen Saturation is 99% on RA, normal by my interpretation.    COORDINATION OF CARE: 7:19 PM Discussed treatment plan which includes to order and prescribe indomethacin 50 mg and Hydrocodone 5-325 mg. Advised pt to follow up with PCP for management of his gout. Will also give dietary guidelines to reduce for gout. Discussed DVT symptoms with pt and advised return precautions. Pt acknowledges and agrees to plan.   Labs Review Labs Reviewed - No data to display  Imaging Review No results found.   EKG Interpretation None      Filed Vitals:   12/18/14 1907  BP: 137/96  Pulse: 75  Temp: 98.2 F (36.8 C)  TempSrc: Oral  Resp: 16  SpO2: 99%     MDM   Final diagnoses:  Acute gout of left knee, unspecified cause   This is a 59 year old male with history of gout who presents to the emergency department complaining of left knee pain for the past 3 days. Patient reports his pain is the same as his last gout flare on 11/28/2014. He denies any pain or swelling to his calves. On exam the patient is afebrile and nontoxic appearing. The patient has tenderness and warmth to the anterior aspect of his left knee with a mild knee joint effusion. There is no overlying skin changes. He has normal active and passive range of motion of his left  knee. He has no left calf tenderness. His bilateral calves are equal in size. This patient's exam is consistent with a gout flare and I'm not concerned for DVT or septic joint at this time. We'll discharge with a short course of indomethacin and Norco for pain control. Patient's lab work and 06/2014 indicated a normal creatinine and he has no contra indications for and set at this time. Patient reports this previous treatment plan worked well for him. Education also provided on dietary guidelines to reduce risk of repeat gout flares. I advised the patient to follow-up with their primary care provider this week. I advised the patient to return to the emergency department with new or worsening symptoms or new concerns. The patient verbalized understanding and agreement with plan.    I personally performed the services described in this documentation, which was scribed in my presence. The recorded information has been reviewed and is accurate.      Chrissie Noa  Ileana Ladd 12/18/14 2009  Ricky Canal, MD 12/19/14 [phone]

## 2015-07-22 ENCOUNTER — Emergency Department (HOSPITAL_COMMUNITY)
Admission: EM | Admit: 2015-07-22 | Discharge: 2015-07-22 | Disposition: A | Discharge status: Home / Self Care | Attending: Emergency Medicine | Admitting: Emergency Medicine

## 2015-07-22 ENCOUNTER — Encounter (HOSPITAL_COMMUNITY): Payer: Self-pay

## 2015-07-22 DIAGNOSIS — G8929 Other chronic pain: Secondary | ICD-10-CM

## 2015-07-22 DIAGNOSIS — Z79899 Other long term (current) drug therapy: Secondary | ICD-10-CM | POA: Diagnosis not present

## 2015-07-22 DIAGNOSIS — E785 Hyperlipidemia, unspecified: Secondary | ICD-10-CM | POA: Insufficient documentation

## 2015-07-22 DIAGNOSIS — M5416 Radiculopathy, lumbar region: Secondary | ICD-10-CM | POA: Diagnosis not present

## 2015-07-22 DIAGNOSIS — Z87891 Personal history of nicotine dependence: Secondary | ICD-10-CM | POA: Insufficient documentation

## 2015-07-22 DIAGNOSIS — I10 Essential (primary) hypertension: Secondary | ICD-10-CM | POA: Diagnosis not present

## 2015-07-22 DIAGNOSIS — M79605 Pain in left leg: Secondary | ICD-10-CM | POA: Diagnosis not present

## 2015-07-22 DIAGNOSIS — M549 Dorsalgia, unspecified: Secondary | ICD-10-CM | POA: Diagnosis present

## 2015-07-22 DIAGNOSIS — M25562 Pain in left knee: Secondary | ICD-10-CM | POA: Diagnosis not present

## 2015-07-22 DIAGNOSIS — M25561 Pain in right knee: Secondary | ICD-10-CM | POA: Insufficient documentation

## 2015-07-22 DIAGNOSIS — Z791 Long term (current) use of non-steroidal anti-inflammatories (NSAID): Secondary | ICD-10-CM | POA: Diagnosis not present

## 2015-07-22 MED ORDER — METHOCARBAMOL 500 MG PO TABS: 500.0000 mg | ORAL_TABLET | Freq: Four times a day (QID) | ORAL | Status: DC | PRN

## 2015-07-22 NOTE — ED Notes (Signed)
Declined W/C at D/C and was escorted to lobby by RN. 

## 2015-07-22 NOTE — ED Provider Notes (Signed)
CSN: [id number]     Arrival date & time 07/22/15  0516 History   First MD Initiated Contact with Patient 07/22/15 0710     Chief Complaint  Patient presents with  . Leg Pain  . Back Pain     (Consider location/radiation/quality/duration/timing/severity/associated sxs/prior Treatment) The history is provided by the patient.     Pt with hx rheumatoid arthritis, chronic back and left leg pain presents with exacerbation of his chronic pain.  States he has had this same pain for years, when the weather turned cold last week his pain became worse.  The pain consists of sharp shooting pain that runs from his back down his left leg, worse with sitting for long periods of time and with certain activities such as stepping on the clutch pedal.  Has chronic pain in his bilateral knees as well, not as bad currently as it has been in the past. Is taking Celebrex and namubetone without improvement.  Denies fevers, chills, abdominal pain, loss of control of bowel or bladder, weakness of numbness of the extremities, saddle anesthesia. States this feels exactly like his chronic back and leg pain, just more intense.  Denies any falls or injuries.  Has an appointment in 4 days with his PCP, was requesting pain medication by phone to the office but they required him to be seen first.    Past Medical History  Diagnosis Date  . Hypertension   . Hyperlipidemia    History reviewed. No pertinent past surgical history. History reviewed. No pertinent family history. Social History  Substance Use Topics  . Smoking status: Former Smoker    Quit date: 07/21/1977  . Smokeless tobacco: None  . Alcohol Use: 3.6 oz/week    6 Cans of beer per week    Review of Systems  Constitutional: Negative for fever.  Respiratory: Negative for cough and shortness of breath.   Cardiovascular: Negative for chest pain.  Gastrointestinal: Negative for abdominal pain.  Musculoskeletal: Positive for back pain.  Allergic/Immunologic:  Negative for immunocompromised state.  Neurological: Negative for weakness and numbness.  Hematological: Does not bruise/bleed easily.  Psychiatric/Behavioral: Negative for self-injury.      Allergies  Review of patient's allergies indicates no known allergies.  Home Medications   Prior to Admission medications   Medication Sig Start Date End Date Taking? Authorizing Provider  amLODipine-atorvastatin (CADUET) 10-40 MG per tablet Take 1 tablet by mouth daily.    Historical Provider, MD  HYDROcodone-acetaminophen (NORCO/VICODIN) 5-325 MG per tablet Take 1 tablet by mouth every 4 (four) hours as needed. 12/18/14   Everlene FarrierWilliam Dansie, PA-C  ibuprofen (ADVIL,MOTRIN) 800 MG tablet Take 1 tablet (800 mg total) by mouth 3 (three) times daily. Patient not taking: Reported on 06/12/2014 06/19/12   Elson AreasLeslie K Sofia, PA-C  indomethacin (INDOCIN) 50 MG capsule Take 1 capsule (50 mg total) by mouth 3 (three) times daily with meals. 12/18/14   Everlene FarrierWilliam Dansie, PA-C   BP 130/93 mmHg  Pulse 70  Temp(Src) 97.5 F (36.4 C) (Oral)  Resp 20  Ht 5\' 11"  (1.803 m)  Wt 115.667 kg  BMI 35.58 kg/m2  SpO2 97% Physical Exam  Constitutional: He appears well-developed and well-nourished. No distress.  HENT:  Head: Normocephalic and atraumatic.  Neck: Neck supple.  Pulmonary/Chest: Effort normal.  Abdominal: Soft. He exhibits no distension. There is no tenderness. There is no rebound and no guarding.  Musculoskeletal:  Spine nontender, no crepitus, or stepoffs. Lower extremities:  Strength 5/5, sensation intact, distal pulses intact.  Neurological: He is alert. He exhibits normal muscle tone.  Ambulates with cane, no other assistance  Skin: He is not diaphoretic.  Psychiatric: He has a normal mood and affect. His behavior is normal.  Nursing note and vitals reviewed.   ED Course  Procedures (including critical care time) Labs Review Labs Reviewed - No data to display  Imaging Review No results  found. I have personally reviewed and evaluated these images and lab results as part of my medical decision-making.   EKG Interpretation None      MDM   Final diagnoses:  Chronic back pain  Lumbar radiculopathy, chronic    Afebrile, nontoxic patient with exacerbation of chronic back pain.  No red flags with history or exam.  Neurovascularly intact.  Emergent imaging not indicated at this time.   D/C home with robaxin, PCP follow up.  Discussed result, findings, treatment, and follow up  with patient.  Pt given return precautions.  Pt verbalizes understanding and agrees with plan.           Trixie Dredge, PA-C 07/22/15 [id number]  Melene Plan, DO 07/22/15 [phone]

## 2015-07-22 NOTE — ED Notes (Signed)
Pt has chronic leg and back pain but it has gotten worse the last two weeks. Has an appointment with PCP on Monday but couldn't wait that long. Needed something so he could sleep.

## 2015-07-22 NOTE — Discharge Instructions (Signed)
Read the information below.  Use the prescribed medication as directed.  Please discuss all new medications with your pharmacist.  You may return to the Emergency Department at any time for worsening condition or any new symptoms that concern you.    If you develop fevers, loss of control of bowel or bladder, weakness or numbness in your legs, or are unable to walk, return to the ER for a recheck.  ° ° °Chronic Back Pain ° When back pain lasts longer than 3 months, it is called chronic back pain. People with chronic back pain often go through certain periods that are more intense (flare-ups).  °CAUSES °Chronic back pain can be caused by wear and tear (degeneration) on different structures in your back. These structures include: °· The bones of your spine (vertebrae) and the joints surrounding your spinal cord and nerve roots (facets). °· The strong, fibrous tissues that connect your vertebrae (ligaments). °Degeneration of these structures may result in pressure on your nerves. This can lead to constant pain. °HOME CARE INSTRUCTIONS °· Avoid bending, heavy lifting, prolonged sitting, and activities which make the problem worse. °· Take brief periods of rest throughout the day to reduce your pain. Lying down or standing usually is better than sitting while you are resting. °· Take over-the-counter or prescription medicines only as directed by your caregiver. °SEEK IMMEDIATE MEDICAL CARE IF:  °· You have weakness or numbness in one of your legs or feet. °· You have trouble controlling your bladder or bowels. °· You have nausea, vomiting, abdominal pain, shortness of breath, or fainting. °  °This information is not intended to replace advice given to you by your health care provider. Make sure you discuss any questions you have with your health care provider. °  °Document Released: 08/03/2004 Document Revised: 09/18/2011 Document Reviewed: 12/14/2014 °Elsevier Interactive Patient Education ©2016 [employer] ° °

## 2015-11-06 ENCOUNTER — Emergency Department (HOSPITAL_COMMUNITY)
Admission: EM | Admit: 2015-11-06 | Discharge: 2015-11-06 | Disposition: A | Discharge status: Home / Self Care | Attending: Emergency Medicine | Admitting: Emergency Medicine

## 2015-11-06 ENCOUNTER — Encounter (HOSPITAL_COMMUNITY): Payer: Self-pay

## 2015-11-06 DIAGNOSIS — I10 Essential (primary) hypertension: Secondary | ICD-10-CM | POA: Diagnosis not present

## 2015-11-06 DIAGNOSIS — R1084 Generalized abdominal pain: Secondary | ICD-10-CM | POA: Insufficient documentation

## 2015-11-06 DIAGNOSIS — Z79899 Other long term (current) drug therapy: Secondary | ICD-10-CM | POA: Diagnosis not present

## 2015-11-06 DIAGNOSIS — R112 Nausea with vomiting, unspecified: Secondary | ICD-10-CM | POA: Insufficient documentation

## 2015-11-06 DIAGNOSIS — E785 Hyperlipidemia, unspecified: Secondary | ICD-10-CM | POA: Insufficient documentation

## 2015-11-06 DIAGNOSIS — Z87891 Personal history of nicotine dependence: Secondary | ICD-10-CM | POA: Diagnosis not present

## 2015-11-06 LAB — COMPREHENSIVE METABOLIC PANEL
ALK PHOS: 59 U/L (ref 38–126)
ALT: 38 U/L (ref 17–63)
AST: 23 U/L (ref 15–41)
Albumin: 4.4 g/dL (ref 3.5–5.0)
Anion gap: 12 (ref 5–15)
BUN: 9 mg/dL (ref 6–20)
CO2: 20 mmol/L — ABNORMAL LOW (ref 22–32)
CREATININE: 0.86 mg/dL (ref 0.61–1.24)
Calcium: 9.7 mg/dL (ref 8.9–10.3)
Chloride: 105 mmol/L (ref 101–111)
Glucose, Bld: 115 mg/dL — ABNORMAL HIGH (ref 65–99)
Potassium: 4.1 mmol/L (ref 3.5–5.1)
Sodium: 137 mmol/L (ref 135–145)
Total Bilirubin: 1.5 mg/dL — ABNORMAL HIGH (ref 0.3–1.2)
Total Protein: 7.6 g/dL (ref 6.5–8.1)

## 2015-11-06 LAB — CBC
HEMATOCRIT: 44.2 % (ref 39.0–52.0)
Hemoglobin: 14.7 g/dL (ref 13.0–17.0)
MCH: 29.7 pg (ref 26.0–34.0)
MCHC: 33.3 g/dL (ref 30.0–36.0)
MCV: 89.3 fL (ref 78.0–100.0)
PLATELETS: 235 10*3/uL (ref 150–400)
RBC: 4.95 MIL/uL (ref 4.22–5.81)
RDW: 13.4 % (ref 11.5–15.5)
WBC: 6.3 10*3/uL (ref 4.0–10.5)

## 2015-11-06 LAB — LIPASE, BLOOD: Lipase: 32 U/L (ref 11–51)

## 2015-11-06 NOTE — ED Provider Notes (Signed)
CSN: [id number]     Arrival date & time 11/06/15  1202 History   First MD Initiated Contact with Patient 11/06/15 1505     Chief Complaint  Patient presents with  . Abdominal Pain     (Consider location/radiation/quality/duration/timing/severity/associated sxs/prior Treatment) HPI   Pt is a 60 yo male with PMH of HTN who presents to the ED with complaint of diffuse abdominal pain, onset 8am. Pt reports while he was using a ratchet to try to remove a bolt off of a lawn mower and straining he began to have sudden constant onset diffuse abdominal "tightness". Pt reports his abdomen felt bloated. He notes when he tried to take medicine at home for his pain he had sudden onset nausea and one episode of NBNB vomiting. Pt reports since arrival to the ED his abdominal pain has started to resolve and went from a 8/10 to a 3/10. Pt denies currently feeling nauseous. Denies fever, chills, HA, diaphoresis, cough, SOB, CP, back pain, diarrhea, urinary sxs, lightheadedness, dizziness. Denies hx of abdominal surgeries.   Past Medical History  Diagnosis Date  . Hypertension   . Hyperlipidemia    History reviewed. No pertinent past surgical history. No family history on file. Social History  Substance Use Topics  . Smoking status: Former Smoker    Quit date: 07/21/1977  . Smokeless tobacco: None  . Alcohol Use: 3.6 oz/week    6 Cans of beer per week    Review of Systems  Gastrointestinal: Positive for nausea, vomiting and abdominal pain.  All other systems reviewed and are negative.     Allergies  Review of patient's allergies indicates no known allergies.  Home Medications   Prior to Admission medications   Medication Sig Start Date End Date Taking? Authorizing Provider  amLODipine-atorvastatin (CADUET) 10-40 MG per tablet Take 1 tablet by mouth daily.   Yes Historical Provider, MD  HYDROcodone-acetaminophen (NORCO/VICODIN) 5-325 MG per tablet Take 1 tablet by mouth every 4 (four) hours  as needed. 12/18/14   Everlene FarrierWilliam Dansie, PA-C  ibuprofen (ADVIL,MOTRIN) 800 MG tablet Take 1 tablet (800 mg total) by mouth 3 (three) times daily. Patient not taking: Reported on 06/12/2014 06/19/12   Elson AreasLeslie K Sofia, PA-C  indomethacin (INDOCIN) 50 MG capsule Take 1 capsule (50 mg total) by mouth 3 (three) times daily with meals. 12/18/14   Everlene FarrierWilliam Dansie, PA-C  methocarbamol (ROBAXIN) 500 MG tablet Take 1-2 tablets (500-1,000 mg total) by mouth every 6 (six) hours as needed for muscle spasms. 07/22/15   Trixie DredgeEmily West, PA-C   BP 146/98 mmHg  Pulse 68  Temp(Src) 97.9 F (36.6 C) (Oral)  Resp 13  SpO2 100% Physical Exam  Constitutional: He is oriented to person, place, and time. He appears well-developed and well-nourished. No distress.  HENT:  Head: Normocephalic and atraumatic.  Mouth/Throat: Oropharynx is clear and moist. No oropharyngeal exudate.  Eyes: Conjunctivae and EOM are normal. Right eye exhibits no discharge. Left eye exhibits no discharge. No scleral icterus.  Neck: Normal range of motion. Neck supple.  Cardiovascular: Normal rate, regular rhythm, normal heart sounds and intact distal pulses.   Pulmonary/Chest: Effort normal and breath sounds normal. No respiratory distress. He has no wheezes. He has no rales. He exhibits no tenderness.  Abdominal: Soft. Bowel sounds are normal. He exhibits no distension and no mass. There is no tenderness. There is no rebound and no guarding. Hernia confirmed negative in the right inguinal area and confirmed negative in the left inguinal area.  Musculoskeletal:  Normal range of motion. He exhibits no edema.  Neurological: He is alert and oriented to person, place, and time.  Skin: Skin is warm and dry. He is not diaphoretic.  Nursing note and vitals reviewed.   ED Course  Procedures (including critical care time) Labs Review Labs Reviewed  COMPREHENSIVE METABOLIC PANEL - Abnormal; Notable for the following:    CO2 20 (*)    Glucose, Bld 115 (*)     Total Bilirubin 1.5 (*)    All other components within normal limits  LIPASE, BLOOD  CBC    Imaging Review No results found. I have personally reviewed and evaluated these images and lab results as part of my medical decision-making.   EKG Interpretation   Date/Time:  Saturday November 06 2015 16:42:41 EDT Ventricular Rate:  69 PR Interval:  202 QRS Duration: 92 QT Interval:  403 QTC Calculation: 432 R Axis:   66 Text Interpretation:  Sinus rhythm Borderline prolonged PR interval  Otherwise normal ECG no significant change since 2009 Confirmed by  GOLDSTON  MD, SCOTT ([id number]) on 11/06/2015 4:50:47 PM      MDM   Final diagnoses:  Generalized abdominal pain    Patient presents with generalized abdominal pain that occurred after straining while trying to pull a bolt out of the lawnmower prior to arrival. Patient reports episode of nausea and vomiting prior to arrival. Nausea and vomiting has resolved since arrival to the ED and patient reports his pain has also improved. Patient denies any other associated symptoms including diaphoresis, lightheadedness, dizziness, chest pain, shortness of breath, back pain. VSS. Exam unremarkable, abdominal exam benign. Patient reports his abdominal pain has significantly improved since arrival to the ED. Labs unremarkable. EKG showed sinus rhythm with prolonged PR interval, otherwise no significant changes from prior. I suspect pt's abdominal pain is likely muscle strain associated with recent incident of straining to remove a bolt from a lawn mower. Discussed results and plan for discharge with patient. Discussed symptomatic treatment advised patient to follow up with his PCP in 3-4 days. Discussed her return precautions with patient.    Satira Sark [place], [place] 11/06/15 1706  Pricilla Loveless, MD 11/11/15 [phone]

## 2015-11-06 NOTE — ED Notes (Signed)
Patient here with generalized abdominal pain after pulling bolt of of lawn mower this am, reports 1 episode of vomiting with same

## 2015-11-06 NOTE — Discharge Instructions (Signed)
I recommend continuing to take your home medications as prescribed. You may also take Tylenol and/or ibuprofen as prescribed over-the-counter as needed for pain relief. I recommend eating prior to taking ibuprofen to prevent stomach pain. Follow-up with your primary care provider in the next 2-3 days. Please return to the Emergency Department if symptoms worsen or new onset of fever, worsening abdominal pain, vomiting, chest pain, shortness of breath, back pain, blood in vomit or stool.

## 2016-08-27 ENCOUNTER — Emergency Department (HOSPITAL_COMMUNITY)
Admission: EM | Admit: 2016-08-27 | Discharge: 2016-08-27 | Disposition: A | Discharge status: Home / Self Care | Attending: Emergency Medicine | Admitting: Emergency Medicine

## 2016-08-27 ENCOUNTER — Encounter (HOSPITAL_COMMUNITY): Payer: Self-pay | Admitting: *Deleted

## 2016-08-27 DIAGNOSIS — Z79899 Other long term (current) drug therapy: Secondary | ICD-10-CM | POA: Insufficient documentation

## 2016-08-27 DIAGNOSIS — R05 Cough: Secondary | ICD-10-CM | POA: Insufficient documentation

## 2016-08-27 DIAGNOSIS — I10 Essential (primary) hypertension: Secondary | ICD-10-CM | POA: Diagnosis not present

## 2016-08-27 DIAGNOSIS — Z87891 Personal history of nicotine dependence: Secondary | ICD-10-CM | POA: Diagnosis not present

## 2016-08-27 DIAGNOSIS — R6889 Other general symptoms and signs: Secondary | ICD-10-CM

## 2016-08-27 MED ORDER — OSELTAMIVIR PHOSPHATE 75 MG PO CAPS: 75.0000 mg | ORAL_CAPSULE | Freq: Two times a day (BID) | ORAL | 0 refills | Status: DC

## 2016-08-27 NOTE — ED Provider Notes (Signed)
[facility]-EMERGENCY DEPT Provider Note   CSN: [id number] Arrival date & time: 08/27/16  1204  By signing my name below, I, Freida Busman, attest that this documentation has been prepared under the direction and in the presence of Margarita Grizzle, MD . Electronically Signed: Freida Busman, Scribe. 08/27/2016. 12:37 PM.  History   Chief Complaint Chief Complaint  Patient presents with  . Influenza    The history is provided by the patient. No language interpreter was used.    HPI Comments:  Ricky Bailey is a 61 y.o. male with a history of HTN and HLD, who presents to the Emergency Department complaining of a productive cough with yellow sputum x 4 days. He reports associated sore throat, chest congestion, and body aches. He has taken OTC nyquil/alkaseltzer type medication without relief. Pt notes 1 episode of SOB with exertion this AM; no SOB at this time.  Pt received his flu shot this season. He is not a smoker. Pt denies fever, vomiting, diarrhea, and appetite change. Pt notes wife was recently sick with similar symptoms.   Past Medical History:  Diagnosis Date  . Hyperlipidemia   . Hypertension     There are no active problems to display for this patient.   History reviewed. No pertinent surgical history.     Home Medications    Prior to Admission medications   Medication Sig Start Date End Date Taking? Authorizing Provider  amLODipine-atorvastatin (CADUET) 10-40 MG per tablet Take 1 tablet by mouth daily.    Historical Provider, MD  HYDROcodone-acetaminophen (NORCO/VICODIN) 5-325 MG per tablet Take 1 tablet by mouth every 4 (four) hours as needed. 12/18/14   [name], PA-C  ibuprofen (ADVIL,MOTRIN) 800 MG tablet Take 1 tablet (800 mg total) by mouth 3 (three) times daily. Patient not taking: Reported on 06/12/2014 06/19/12   Elson Areas, PA-C  indomethacin (INDOCIN) 50 MG capsule Take 1 capsule (50 mg total) by mouth 3 (three) times daily with meals. 12/18/14   [name], PA-C  methocarbamol (ROBAXIN) 500 MG tablet Take 1-2 tablets (500-1,000 mg total) by mouth every 6 (six) hours as needed for muscle spasms. 07/22/15   Trixie Dredge, PA-C  oseltamivir (TAMIFLU) 75 MG capsule Take 1 capsule (75 mg total) by mouth every 12 (twelve) hours. 08/27/16   Margarita Grizzle, MD    Family History History reviewed. No pertinent family history.  Social History Social History  Substance Use Topics  . Smoking status: Former Smoker    Quit date: 07/21/1977  . Smokeless tobacco: Not on file  . Alcohol use 3.6 oz/week    6 Cans of beer per week     Allergies   Patient has no known allergies.   Review of Systems Review of Systems  Constitutional: Negative for appetite change and fever.  HENT: Positive for congestion and sore throat.   Respiratory: Positive for cough and shortness of breath.   Gastrointestinal: Negative for diarrhea, nausea and vomiting.  Musculoskeletal: Positive for myalgias.     Physical Exam Updated Vital Signs BP 148/94 (BP Location: Left Arm)   Pulse 77   Temp 98.9 F (37.2 C) (Oral)   Resp 19   SpO2 99%   Physical Exam  Constitutional: He is oriented to person, place, and time. He appears well-developed and well-nourished. No distress.  HENT:  Head: Normocephalic and atraumatic.  Eyes: Conjunctivae and EOM are normal. Pupils are equal, round, and reactive to light.  Neck: Normal range of motion. Neck supple.  Cardiovascular: Normal  rate, regular rhythm and normal heart sounds.   Pulmonary/Chest: Effort normal and breath sounds normal. He has no wheezes.  Abdominal: Soft. Bowel sounds are normal. He exhibits no distension. There is no tenderness.  Musculoskeletal: Normal range of motion.  Neurological: He is alert and oriented to person, place, and time.  Skin: Skin is warm and dry.  Psychiatric: He has a normal mood and affect.  Nursing note and vitals reviewed.    ED Treatments / Results  DIAGNOSTIC STUDIES:  Oxygen  Saturation is 99% on RA, normal by my interpretation.    COORDINATION OF CARE:  12:29 PM Discussed treatment plan with pt at bedside and pt agreed to plan.  Labs (all labs ordered are listed, but only abnormal results are displayed) Labs Reviewed - No data to display  EKG  EKG Interpretation None       Radiology No results found.  Procedures Procedures (including critical care time)  Medications Ordered in ED Medications - No data to display   Initial Impression / Assessment and Plan / ED Course  I have reviewed the triage vital signs and the nursing notes.  Pertinent labs & imaging results that were available during my care of the patient were reviewed by me and considered in my medical decision making (see chart for details).       Final Clinical Impressions(s) / ED Diagnoses   Final diagnoses:  Flu-like symptoms    New Prescriptions New Prescriptions   OSELTAMIVIR (TAMIFLU) 75 MG CAPSULE    Take 1 capsule (75 mg total) by mouth every 12 (twelve) hours.   I personally performed the services described in this documentation, which was scribed in my presence. The recorded information has been reviewed and considered.    Margarita Grizzleanielle Hasaan Radde, MD 08/27/16 2044

## 2016-08-27 NOTE — ED Notes (Signed)
Pt reports flu like symptoms starting Thursday.

## 2016-08-27 NOTE — ED Triage Notes (Signed)
Pt reports flu like symptoms since Thursday. Has productive cough, chills, bodyaches, headache and sore throat. Mask on pt at triage.

## 2016-08-27 NOTE — ED Notes (Signed)
ED Provider at bedside. 

## 2017-09-21 ENCOUNTER — Emergency Department (HOSPITAL_COMMUNITY)

## 2017-09-21 ENCOUNTER — Other Ambulatory Visit: Payer: Self-pay

## 2017-09-21 ENCOUNTER — Emergency Department (HOSPITAL_COMMUNITY)
Admission: EM | Admit: 2017-09-21 | Discharge: 2017-09-21 | Disposition: A | Discharge status: Home / Self Care | Attending: Emergency Medicine | Admitting: Emergency Medicine

## 2017-09-21 ENCOUNTER — Encounter (HOSPITAL_COMMUNITY): Payer: Self-pay

## 2017-09-21 DIAGNOSIS — I10 Essential (primary) hypertension: Secondary | ICD-10-CM | POA: Insufficient documentation

## 2017-09-21 DIAGNOSIS — R0789 Other chest pain: Secondary | ICD-10-CM | POA: Insufficient documentation

## 2017-09-21 DIAGNOSIS — Z87891 Personal history of nicotine dependence: Secondary | ICD-10-CM | POA: Insufficient documentation

## 2017-09-21 DIAGNOSIS — Z79899 Other long term (current) drug therapy: Secondary | ICD-10-CM | POA: Diagnosis not present

## 2017-09-21 LAB — I-STAT TROPONIN, ED
TROPONIN I, POC: 0 ng/mL (ref 0.00–0.08)
Troponin i, poc: 0 ng/mL (ref 0.00–0.08)

## 2017-09-21 LAB — BASIC METABOLIC PANEL
Anion gap: 9 (ref 5–15)
BUN: 9 mg/dL (ref 6–20)
CALCIUM: 8.9 mg/dL (ref 8.9–10.3)
CO2: 22 mmol/L (ref 22–32)
CREATININE: 1.05 mg/dL (ref 0.61–1.24)
Chloride: 107 mmol/L (ref 101–111)
GFR calc non Af Amer: >60 mL/min (ref >60)
GLUCOSE: 99 mg/dL (ref 65–99)
Potassium: 3.6 mmol/L (ref 3.5–5.1)
Sodium: 138 mmol/L (ref 135–145)

## 2017-09-21 LAB — CBC
HCT: 43 % (ref 39.0–52.0)
Hemoglobin: 14.5 g/dL (ref 13.0–17.0)
MCH: 30.6 pg (ref 26.0–34.0)
MCHC: 33.7 g/dL (ref 30.0–36.0)
MCV: 90.7 fL (ref 78.0–100.0)
PLATELETS: 210 10*3/uL (ref 150–400)
RBC: 4.74 MIL/uL (ref 4.22–5.81)
RDW: 13.4 % (ref 11.5–15.5)
WBC: 5.2 10*3/uL (ref 4.0–10.5)

## 2017-09-21 NOTE — ED Provider Notes (Signed)
[facility] EMERGENCY DEPARTMENT Provider Note   CSN: [id number] Arrival date & time: 09/21/17  1535     History   Chief Complaint Chief Complaint  Patient presents with  . Chest Pain    HPI Ricky Bailey is a 62 y.o. male.  The history is provided by the patient.  Chest Pain   This is a new problem. The current episode started more than 2 days ago. The problem has not changed since onset.The pain is associated with an emotional upset and lifting (pulling). Pain location: mid to right chest. The pain is mild. The quality of the pain is described as brief and dull. The pain does not radiate. Pertinent negatives include no abdominal pain, no back pain, no cough, no dizziness, no exertional chest pressure, no fever, no lower extremity edema, no nausea, no near-syncope, no palpitations, no shortness of breath, no syncope and no vomiting. Risk factors include male gender, obesity, sedentary lifestyle and stress.  His past medical history is significant for hyperlipidemia and hypertension.  Pertinent negatives for family medical history include: no early MI.  Procedure history is positive for stress echo.    Past Medical History:  Diagnosis Date  . Hyperlipidemia   . Hypertension     There are no active problems to display for this patient.   History reviewed. No pertinent surgical history.     Home Medications    Prior to Admission medications   Medication Sig Start Date End Date Taking? Authorizing Provider  amLODipine-atorvastatin (CADUET) 10-40 MG per tablet Take 1 tablet by mouth daily.    [provider]  HYDROcodone-acetaminophen (NORCO/VICODIN) 5-325 MG per tablet Take 1 tablet by mouth every 4 (four) hours as needed. 12/18/14   [name], PA-C  ibuprofen (ADVIL,MOTRIN) 800 MG tablet Take 1 tablet (800 mg total) by mouth 3 (three) times daily. Patient not taking: Reported on 06/12/2014 06/19/12   Elson Areas, PA-C  indomethacin  (INDOCIN) 50 MG capsule Take 1 capsule (50 mg total) by mouth 3 (three) times daily with meals. 12/18/14   [name], PA-C  methocarbamol (ROBAXIN) 500 MG tablet Take 1-2 tablets (500-1,000 mg total) by mouth every 6 (six) hours as needed for muscle spasms. 07/22/15   Trixie Dredge, PA-C  oseltamivir (TAMIFLU) 75 MG capsule Take 1 capsule (75 mg total) by mouth every 12 (twelve) hours. 08/27/16   Margarita Grizzle, MD    Family History No family history on file.  Social History Social History   Tobacco Use  . Smoking status: Former Smoker    Last attempt to quit: 07/21/1977    Years since quitting: 40.1  . Smokeless tobacco: Never Used  Substance Use Topics  . Alcohol use: Yes    Alcohol/week: 3.6 oz    Types: 6 Cans of beer per week  . Drug use: No     Allergies   Patient has no known allergies.   Review of Systems Review of Systems  Constitutional: Negative for chills, fatigue and fever.  HENT: Negative for ear pain and sore throat.   Eyes: Negative for pain and visual disturbance.  Respiratory: Negative for cough, chest tightness and shortness of breath.   Cardiovascular: Positive for chest pain. Negative for palpitations, leg swelling, syncope and near-syncope.  Gastrointestinal: Negative for abdominal pain, nausea and vomiting.  Genitourinary: Negative for dysuria and hematuria.  Musculoskeletal: Negative for back pain and neck pain.  Skin: Negative for color change and rash.  Neurological: Negative for dizziness, syncope  and light-headedness.  All other systems reviewed and are negative.    Physical Exam Updated Vital Signs BP 113/73   Pulse 65   Temp 98.7 F (37.1 C) (Oral)   Resp (!) 22   SpO2 100%   Physical Exam  Constitutional: He appears well-developed and well-nourished. He does not appear ill. No distress.  HENT:  Head: Normocephalic and atraumatic.  Eyes: Conjunctivae are normal.  Neck: Neck supple.  Cardiovascular: Normal rate, regular rhythm,  intact distal pulses and normal pulses. Exam reveals no friction rub.  No murmur heard. Pulmonary/Chest: Effort normal and breath sounds normal. No respiratory distress. He has no decreased breath sounds. He has no wheezes. He has no rales.  Abdominal: Soft. There is no tenderness.  Musculoskeletal: He exhibits no edema.  Neurological: He is alert.  Skin: Skin is warm and dry.  Psychiatric: He has a normal mood and affect.  Nursing note and vitals reviewed.    ED Treatments / Results  Labs (all labs ordered are listed, but only abnormal results are displayed) Labs Reviewed  BASIC METABOLIC PANEL  CBC  I-STAT TROPONIN, ED  I-STAT TROPONIN, ED    EKG  EKG Interpretation  Date/Time:  Friday September 21 2017 16:08:18 EDT Ventricular Rate:  75 PR Interval:  198 QRS Duration: 86 QT Interval:  386 QTC Calculation: 431 R Axis:   58 Text Interpretation:  Normal sinus rhythm Normal ECG No significant change since last tracing Confirmed by Drema Pry [phone]) on 09/21/2017 8:45:35 PM       Radiology Dg Chest 2 View  Result Date: 09/21/2017 CLINICAL DATA:  Chest pain. EXAM: CHEST - 2 VIEW COMPARISON:  Radiographs of June 19, 2012. FINDINGS: The heart size and mediastinal contours are within normal limits. Both lungs are clear. No pneumothorax or pleural effusion is noted. The visualized skeletal structures are unremarkable. IMPRESSION: No active cardiopulmonary disease. Electronically Signed   By: Lupita Raider, M.D.   On: 09/21/2017 17:16    Procedures Procedures (including critical care time)  Medications Ordered in ED Medications - No data to display   Initial Impression / Assessment and Plan / ED Course  I have reviewed the triage vital signs and the nursing notes.  Pertinent labs & imaging results that were available during my care of the patient were reviewed by me and considered in my medical decision making (see chart for details).     Patient is a  62 year old male with history of hyperlipidemia and hypertension who presents for evaluation of 5 days of intermittent chest pain.  Chest pain comes on whenever he argues with his wife or under stressful situations.  He states chest pain also occurs whenever he is getting up in the morning and pulling himself up from bed.  He states he is currently asymptomatic.  He denies any exertional chest pain or shortness of breath.  Presentation consistent with atypical chest pain either musculoskeletal or related to stress/anxiety.  Sedation not consistent with ACS.  Chest x-ray unremarkable.  Doubt pneumonia, pneumothorax, PE.  Doubt GERD.    ACS ruled out with delta troponin and EKG shows no ischemia.  Sinus rhythm.  Patient to take ibuprofen for his pain.  He is also to follow-up with the PCP regarding his symptoms.   Final Clinical Impressions(s) / ED Diagnoses   Final diagnoses:  Atypical chest pain    ED Discharge Orders    None       Dwana Melena, DO 09/21/17 2256  Nira Connardama, Pedro Eduardo, MD 09/22/17 [phone]

## 2017-09-21 NOTE — ED Triage Notes (Signed)
PT reports central chest pain x 3-4 days. Pt denies sob, radiation, n/v.

## 2017-09-21 NOTE — Discharge Instructions (Signed)
Take ibuprofen as needed for your chest pain.  Avoid activities that exacerbate your symptoms.  Follow-up with a primary care provider regarding her symptoms.  Work on reducing stress.

## 2018-03-28 ENCOUNTER — Other Ambulatory Visit: Payer: Self-pay

## 2018-03-28 ENCOUNTER — Emergency Department (HOSPITAL_COMMUNITY)

## 2018-03-28 ENCOUNTER — Observation Stay (HOSPITAL_COMMUNITY)
Admission: EM | Admit: 2018-03-28 | Discharge: 2018-03-30 | Disposition: A | Discharge status: Home / Self Care | Attending: Internal Medicine | Admitting: Internal Medicine

## 2018-03-28 ENCOUNTER — Encounter (HOSPITAL_COMMUNITY): Payer: Self-pay | Admitting: Emergency Medicine

## 2018-03-28 DIAGNOSIS — R42 Dizziness and giddiness: Secondary | ICD-10-CM

## 2018-03-28 DIAGNOSIS — M109 Gout, unspecified: Secondary | ICD-10-CM | POA: Insufficient documentation

## 2018-03-28 DIAGNOSIS — R1110 Vomiting, unspecified: Secondary | ICD-10-CM | POA: Diagnosis not present

## 2018-03-28 DIAGNOSIS — R262 Difficulty in walking, not elsewhere classified: Secondary | ICD-10-CM | POA: Diagnosis not present

## 2018-03-28 DIAGNOSIS — E785 Hyperlipidemia, unspecified: Secondary | ICD-10-CM | POA: Insufficient documentation

## 2018-03-28 DIAGNOSIS — E669 Obesity, unspecified: Secondary | ICD-10-CM | POA: Diagnosis present

## 2018-03-28 DIAGNOSIS — Z6836 Body mass index (BMI) 36.0-36.9, adult: Secondary | ICD-10-CM | POA: Diagnosis not present

## 2018-03-28 DIAGNOSIS — Z79899 Other long term (current) drug therapy: Secondary | ICD-10-CM | POA: Insufficient documentation

## 2018-03-28 DIAGNOSIS — N400 Enlarged prostate without lower urinary tract symptoms: Secondary | ICD-10-CM | POA: Insufficient documentation

## 2018-03-28 DIAGNOSIS — H8110 Benign paroxysmal vertigo, unspecified ear: Principal | ICD-10-CM | POA: Diagnosis present

## 2018-03-28 DIAGNOSIS — I10 Essential (primary) hypertension: Secondary | ICD-10-CM | POA: Insufficient documentation

## 2018-03-28 DIAGNOSIS — Z87891 Personal history of nicotine dependence: Secondary | ICD-10-CM | POA: Insufficient documentation

## 2018-03-28 DIAGNOSIS — F419 Anxiety disorder, unspecified: Secondary | ICD-10-CM | POA: Insufficient documentation

## 2018-03-28 HISTORY — DX: Benign prostatic hyperplasia without lower urinary tract symptoms: N40.0

## 2018-03-28 HISTORY — DX: Dizziness and giddiness: R42

## 2018-03-28 HISTORY — DX: Gout, unspecified: M10.9

## 2018-03-28 HISTORY — DX: Obesity, unspecified: E66.9

## 2018-03-28 LAB — URINALYSIS, ROUTINE W REFLEX MICROSCOPIC
BILIRUBIN URINE: NEGATIVE
Bacteria, UA: NONE SEEN
Glucose, UA: NEGATIVE mg/dL
Hgb urine dipstick: NEGATIVE
KETONES UR: NEGATIVE mg/dL
LEUKOCYTES UA: NEGATIVE
NITRITE: NEGATIVE
PROTEIN: 30 mg/dL — AB
Specific Gravity, Urine: 1.008 (ref 1.005–1.030)
pH: 8 (ref 5.0–8.0)

## 2018-03-28 LAB — COMPREHENSIVE METABOLIC PANEL
ALBUMIN: 4 g/dL (ref 3.5–5.0)
ALT: 38 U/L (ref 0–44)
ANION GAP: 9 (ref 5–15)
AST: 27 U/L (ref 15–41)
Alkaline Phosphatase: 56 U/L (ref 38–126)
BILIRUBIN TOTAL: 1.1 mg/dL (ref 0.3–1.2)
BUN: 9 mg/dL (ref 8–23)
CO2: 25 mmol/L (ref 22–32)
Calcium: 9.2 mg/dL (ref 8.9–10.3)
Chloride: 105 mmol/L (ref 98–111)
Creatinine, Ser: 1.12 mg/dL (ref 0.61–1.24)
GFR calc Af Amer: >60 mL/min (ref >60)
GFR calc non Af Amer: >60 mL/min (ref >60)
GLUCOSE: 119 mg/dL — AB (ref 70–99)
POTASSIUM: 3.8 mmol/L (ref 3.5–5.1)
SODIUM: 139 mmol/L (ref 135–145)
TOTAL PROTEIN: 7.2 g/dL (ref 6.5–8.1)

## 2018-03-28 LAB — DIFFERENTIAL
ABS IMMATURE GRANULOCYTES: 0 10*3/uL (ref 0.0–0.1)
BASOS ABS: 0 10*3/uL (ref 0.0–0.1)
Basophils Relative: 1 %
EOS PCT: 3 %
Eosinophils Absolute: 0.2 10*3/uL (ref 0.0–0.7)
IMMATURE GRANULOCYTES: 0 %
LYMPHS ABS: 2.8 10*3/uL (ref 0.7–4.0)
Lymphocytes Relative: 50 %
Monocytes Absolute: 0.5 10*3/uL (ref 0.1–1.0)
Monocytes Relative: 9 %
Neutro Abs: 2.1 10*3/uL (ref 1.7–7.7)
Neutrophils Relative %: 37 %

## 2018-03-28 LAB — I-STAT CHEM 8, ED
BUN: 9 mg/dL (ref 8–23)
CHLORIDE: 103 mmol/L (ref 98–111)
CREATININE: 1.1 mg/dL (ref 0.61–1.24)
Calcium, Ion: 1.14 mmol/L — ABNORMAL LOW (ref 1.15–1.40)
GLUCOSE: 121 mg/dL — AB (ref 70–99)
HCT: 48 % (ref 39.0–52.0)
Hemoglobin: 16.3 g/dL (ref 13.0–17.0)
POTASSIUM: 3.7 mmol/L (ref 3.5–5.1)
Sodium: 140 mmol/L (ref 135–145)
TCO2: 26 mmol/L (ref 22–32)

## 2018-03-28 LAB — CBC
HCT: 48.1 % (ref 39.0–52.0)
Hemoglobin: 15.9 g/dL (ref 13.0–17.0)
MCH: 30.3 pg (ref 26.0–34.0)
MCHC: 33.1 g/dL (ref 30.0–36.0)
MCV: 91.8 fL (ref 78.0–100.0)
PLATELETS: 226 10*3/uL (ref 150–400)
RBC: 5.24 MIL/uL (ref 4.22–5.81)
RDW: 12.7 % (ref 11.5–15.5)
WBC: 5.7 10*3/uL (ref 4.0–10.5)

## 2018-03-28 LAB — CBG MONITORING, ED: Glucose-Capillary: 111 mg/dL — ABNORMAL HIGH (ref 70–99)

## 2018-03-28 LAB — APTT: APTT: 28 s (ref 24–36)

## 2018-03-28 LAB — ETHANOL: Alcohol, Ethyl (B): <10 mg/dL (ref <10)

## 2018-03-28 LAB — PROTIME-INR
INR: 0.98
Prothrombin Time: 12.9 seconds (ref 11.4–15.2)

## 2018-03-28 LAB — TSH: TSH: 1.033 u[IU]/mL (ref 0.350–4.500)

## 2018-03-28 LAB — I-STAT TROPONIN, ED: Troponin i, poc: 0 ng/mL (ref 0.00–0.08)

## 2018-03-28 MED ORDER — MECLIZINE HCL 25 MG PO TABS: 25.0000 mg | ORAL_TABLET | Freq: Three times a day (TID) | ORAL | 0 refills | Status: DC | PRN

## 2018-03-28 MED ORDER — FINASTERIDE 5 MG PO TABS
5.0000 mg | ORAL_TABLET | Freq: Every day | ORAL | Status: DC
  Administered 2018-03-29 – 2018-03-30 (×2): 5 mg via ORAL
  Filled 2018-03-28 (×2): qty 1

## 2018-03-28 MED ORDER — SODIUM CHLORIDE 0.9 % IV SOLN
Freq: Once | INTRAVENOUS | Status: AC
  Administered 2018-03-28: 14:00:00 via INTRAVENOUS

## 2018-03-28 MED ORDER — ONDANSETRON HCL 4 MG/2ML IJ SOLN
4.0000 mg | Freq: Once | INTRAMUSCULAR | Status: AC
  Administered 2018-03-28: 4 mg via INTRAVENOUS
  Filled 2018-03-28: qty 2

## 2018-03-28 MED ORDER — ALLOPURINOL 300 MG PO TABS
300.0000 mg | ORAL_TABLET | Freq: Every day | ORAL | Status: DC
  Administered 2018-03-28 – 2018-03-30 (×3): 300 mg via ORAL
  Filled 2018-03-28 (×3): qty 1

## 2018-03-28 MED ORDER — ALBUTEROL SULFATE (2.5 MG/3ML) 0.083% IN NEBU: 2.5000 mg | INHALATION_SOLUTION | Freq: Four times a day (QID) | RESPIRATORY_TRACT | Status: DC | PRN

## 2018-03-28 MED ORDER — HYDRALAZINE HCL 20 MG/ML IJ SOLN
10.0000 mg | INTRAMUSCULAR | Status: DC | PRN
  Administered 2018-03-28 – 2018-03-29 (×2): 10 mg via INTRAVENOUS
  Filled 2018-03-28 (×2): qty 1

## 2018-03-28 MED ORDER — LORAZEPAM 1 MG PO TABS: 1.0000 mg | ORAL_TABLET | Freq: Three times a day (TID) | ORAL | 0 refills | Status: DC | PRN

## 2018-03-28 MED ORDER — SODIUM CHLORIDE 0.9 % IV BOLUS (SEPSIS)
1000.0000 mL | Freq: Once | INTRAVENOUS | Status: AC
  Administered 2018-03-28: 1000 mL via INTRAVENOUS

## 2018-03-28 MED ORDER — ENOXAPARIN SODIUM 40 MG/0.4ML ~~LOC~~ SOLN
40.0000 mg | SUBCUTANEOUS | Status: DC
  Administered 2018-03-28 – 2018-03-29 (×2): 40 mg via SUBCUTANEOUS
  Filled 2018-03-28 (×2): qty 0.4

## 2018-03-28 MED ORDER — ONDANSETRON HCL 4 MG/2ML IJ SOLN
4.0000 mg | Freq: Four times a day (QID) | INTRAMUSCULAR | Status: DC | PRN
  Administered 2018-03-28 – 2018-03-29 (×2): 4 mg via INTRAVENOUS
  Filled 2018-03-28 (×2): qty 2

## 2018-03-28 MED ORDER — LORAZEPAM 2 MG/ML IJ SOLN
1.0000 mg | Freq: Once | INTRAMUSCULAR | Status: AC
  Administered 2018-03-28: 1 mg via INTRAVENOUS
  Filled 2018-03-28: qty 1

## 2018-03-28 MED ORDER — TRAMADOL HCL 50 MG PO TABS: 50.0000 mg | ORAL_TABLET | Freq: Two times a day (BID) | ORAL | Status: DC | PRN

## 2018-03-28 MED ORDER — ONDANSETRON HCL 4 MG PO TABS: 4.0000 mg | ORAL_TABLET | Freq: Four times a day (QID) | ORAL | Status: DC | PRN

## 2018-03-28 MED ORDER — MECLIZINE HCL 25 MG PO TABS
50.0000 mg | ORAL_TABLET | Freq: Once | ORAL | Status: AC
  Administered 2018-03-28: 50 mg via ORAL
  Filled 2018-03-28: qty 2

## 2018-03-28 MED ORDER — AMLODIPINE BESYLATE 5 MG PO TABS
5.0000 mg | ORAL_TABLET | Freq: Every day | ORAL | Status: DC
  Administered 2018-03-28 – 2018-03-29 (×2): 5 mg via ORAL
  Filled 2018-03-28 (×2): qty 1

## 2018-03-28 MED ORDER — ACETAMINOPHEN 325 MG PO TABS: 650.0000 mg | ORAL_TABLET | Freq: Four times a day (QID) | ORAL | Status: DC | PRN

## 2018-03-28 MED ORDER — ACETAMINOPHEN 650 MG RE SUPP: 650.0000 mg | Freq: Four times a day (QID) | RECTAL | Status: DC | PRN

## 2018-03-28 MED ORDER — SODIUM CHLORIDE 0.9% FLUSH
3.0000 mL | Freq: Two times a day (BID) | INTRAVENOUS | Status: DC
  Administered 2018-03-28 – 2018-03-29 (×3): 3 mL via INTRAVENOUS

## 2018-03-28 NOTE — ED Triage Notes (Signed)
Patient presents to the ED from home with complaints of Dizziness that started about 2000. Patient states he was eating when occurred patient denies any nuro symptom states dizziness worst when he turn head to the right. Patient alert and oriented on arrival. States vomited x3.

## 2018-03-28 NOTE — H&P (Signed)
History and Physical    Ricky OrionJerome Bailey ZOX:[id number] DOB: 1955-12-24 DOA: 03/28/2018  Referring MD/NP/PA:David Adriana Simasook, MD PCP: System, Pcp Not In  Patient coming from: home  Chief Complaint: Dizziness  I have personally briefly reviewed patient's old medical records in  Link   HPI: Ricky Bailey is a 62 y.o. male with medical history significant of HTN, HLD, gout, BPH, and obesity; who presented with complaints of dizziness starting last night after dinner around 8 PM.  He woke up around 2:30 AM to try and get ready for work, but noted that the room was spinning around him and he could not get it to stop.  Symptoms worsened by any movement especially with head motions to the right.  Symptoms improved with closing his eyes and staying still.  Associated symptoms include vomiting.  He also notes recently being started on Proscar, hydroxyzine for anxiety, and Zyrtec sometime last week.  Denies any fever, chills, vision loss, hearing loss, tinnitus, ear pain, focal weakness, palpitations, chest pain, abdominal pain, dysuria,  recent trauma to the head, or falls.  He has never had these symptoms before.  ED Course: Upon admission into the emergency department patient was noted to be afebrile, respirations 12-24, blood pressure 133/93-190/95, pulse 71-84, and O2 saturations maintained on room air.  Lab work was relatively unremarkable.  MRI of the brain showed no acute abnormalities.  Patient was given 1 L normal saline IV fluids, total of 12 mg of Zofran for nausea and vomiting, 50 mg of meclizine, 1 mg of Ativan.  TRH was called as patient still experiencing significant vertigo.  Review of Systems  Constitutional: Positive for malaise/fatigue. Negative for chills and fever.  HENT: Negative for ear discharge, ear pain, hearing loss and tinnitus.   Eyes: Negative for double vision and photophobia.  Respiratory: Negative for cough and shortness of breath.   Cardiovascular: Negative for  chest pain and palpitations.  Gastrointestinal: Positive for vomiting. Negative for abdominal pain.  Genitourinary: Negative for dysuria and hematuria.  Musculoskeletal: Negative for falls and myalgias.  Skin: Negative for rash.  Neurological: Positive for dizziness. Negative for focal weakness, loss of consciousness and headaches.  Endo/Heme/Allergies: Positive for environmental allergies. Does not bruise/bleed easily.  Psychiatric/Behavioral: Negative for memory loss. The patient is nervous/anxious.     Past Medical History:  Diagnosis Date  . Hyperlipidemia   . Hypertension     History reviewed. No pertinent surgical history.   reports that he quit smoking about 40 years ago. He has never used smokeless tobacco. He reports that he drinks about 6.0 standard drinks of alcohol per week. He reports that he does not use drugs.  No Known Allergies  Family History  Problem Relation Age of Onset  . Heart disease Mother   . Lung cancer Father     Prior to Admission medications   Medication Sig Start Date End Date Taking? Authorizing Provider  allopurinol (ZYLOPRIM) 300 MG tablet Take 300 mg by mouth daily. 03/07/18  Yes [provider]  amLODipine (NORVASC) 5 MG tablet Take 5 mg by mouth daily. 03/21/18  Yes [provider]  cetirizine (ZYRTEC) 10 MG tablet Take 10 mg by mouth daily. 03/21/18  Yes [provider]  finasteride (PROSCAR) 5 MG tablet Take 5 mg by mouth daily. 03/07/18  Yes [provider]  hydrOXYzine (ATARAX/VISTARIL) 10 MG tablet Take 10 mg by mouth 2 (two) times daily. 03/21/18  Yes [provider]  traMADol (ULTRAM) 50 MG tablet Take 50  mg by mouth 2 (two) times daily as needed for moderate pain.  03/07/18  Yes [provider]  HYDROcodone-acetaminophen (NORCO/VICODIN) 5-325 MG per tablet Take 1 tablet by mouth every 4 (four) hours as needed. Patient not taking: Reported on 03/28/2018 12/18/14   [name], PA-C    ibuprofen (ADVIL,MOTRIN) 800 MG tablet Take 1 tablet (800 mg total) by mouth 3 (three) times daily. Patient not taking: Reported on 06/12/2014 06/19/12   Elson Areas, PA-C  indomethacin (INDOCIN) 50 MG capsule Take 1 capsule (50 mg total) by mouth 3 (three) times daily with meals. Patient not taking: Reported on 03/28/2018 12/18/14   [name], PA-C  LORazepam (ATIVAN) 1 MG tablet Take 1 tablet (1 mg total) by mouth 3 (three) times daily as needed for anxiety. Prn for dizziness 03/28/18   Donnetta Hutching, MD  meclizine (ANTIVERT) 25 MG tablet Take 1 tablet (25 mg total) by mouth 3 (three) times daily as needed for dizziness. 03/28/18   Donnetta Hutching, MD  methocarbamol (ROBAXIN) 500 MG tablet Take 1-2 tablets (500-1,000 mg total) by mouth every 6 (six) hours as needed for muscle spasms. Patient not taking: Reported on 03/28/2018 07/22/15   Trixie Dredge, PA-C  oseltamivir (TAMIFLU) 75 MG capsule Take 1 capsule (75 mg total) by mouth every 12 (twelve) hours. Patient not taking: Reported on 03/28/2018 08/27/16   Margarita Grizzle, MD    Physical Exam:  Constitutional: Obese male who appears to be in NAD, calm, comfortable Vitals:   03/28/18 1100 03/28/18 1115 03/28/18 1130 03/28/18 1145  BP: (!) 153/94 (!) 140/93 (!) 150/94 (!) 133/93  Pulse: 75 84 82 77  Resp: 12 (!) 24 20 18   Temp:      TempSrc:      SpO2: 99% 98% 95% 98%  Weight:      Height:       Eyes: PERRL, lids and conjunctivae normal ENMT: Mucous membranes are moist. Posterior pharynx clear of any exudate or lesions. Cerumen obstructs view of both tympanic membranes. Neck: normal, supple, no masses, no thyromegaly Respiratory: clear to auscultation bilaterally, no wheezing, no crackles. Normal respiratory effort. No accessory muscle use.  Cardiovascular: Regular rate and rhythm, no murmurs / rubs / gallops.  +1 pitting bilateral lower extremity edema. 2+ pedal pulses. No carotid bruits.  Abdomen: no tenderness, no masses palpated. No  hepatosplenomegaly. Bowel sounds positive.  Musculoskeletal: no clubbing / cyanosis. No joint deformity upper and lower extremities. Good ROM, no contractures. Normal muscle tone.  Skin: no rashes, lesions, ulcers. No induration Neurologic: CN 2-12 grossly intact. Sensation intact, DTR normal. Strength 5/5 in all 4.  Psychiatric: Normal judgment and insight. Alert and oriented x 3. Normal mood.     Labs on Admission: I have personally reviewed following labs and imaging studies  CBC: Recent Labs  Lab 03/28/18 0527 03/28/18 0528  WBC 5.7  --   NEUTROABS 2.1  --   HGB 15.9 16.3  HCT 48.1 48.0  MCV 91.8  --   PLT 226  --    Basic Metabolic Panel: Recent Labs  Lab 03/28/18 0527 03/28/18 0528  NA 139 140  K 3.8 3.7  CL 105 103  CO2 25  --   GLUCOSE 119* 121*  BUN 9 9  CREATININE 1.12 1.10  CALCIUM 9.2  --    GFR: Estimated Creatinine Clearance: 90.9 mL/min (by C-G formula based on SCr of 1.1 mg/dL). Liver Function Tests: Recent Labs  Lab 03/28/18 0527  AST 27  ALT 38  ALKPHOS 56  BILITOT 1.1  PROT 7.2  ALBUMIN 4.0   No results for input(s): LIPASE, AMYLASE in the last 168 hours. No results for input(s): AMMONIA in the last 168 hours. Coagulation Profile: Recent Labs  Lab 03/28/18 0527  INR 0.98   Cardiac Enzymes: No results for input(s): CKTOTAL, CKMB, CKMBINDEX, TROPONINI in the last 168 hours. BNP (last 3 results) No results for input(s): PROBNP in the last 8760 hours. HbA1C: No results for input(s): HGBA1C in the last 72 hours. CBG: Recent Labs  Lab 03/28/18 0519  GLUCAP 111*   Lipid Profile: No results for input(s): CHOL, HDL, LDLCALC, TRIG, CHOLHDL, LDLDIRECT in the last 72 hours. Thyroid Function Tests: No results for input(s): TSH, T4TOTAL, FREET4, T3FREE, THYROIDAB in the last 72 hours. Anemia Panel: No results for input(s): VITAMINB12, FOLATE, FERRITIN, TIBC, IRON, RETICCTPCT in the last 72 hours. Urine analysis:    Component Value  Date/Time   COLORURINE STRAW (A) 03/28/2018 0530   APPEARANCEUR CLEAR 03/28/2018 0530   LABSPEC 1.008 03/28/2018 0530   PHURINE 8.0 03/28/2018 0530   GLUCOSEU NEGATIVE 03/28/2018 0530   HGBUR NEGATIVE 03/28/2018 0530   BILIRUBINUR NEGATIVE 03/28/2018 0530   KETONESUR NEGATIVE 03/28/2018 0530   PROTEINUR 30 (A) 03/28/2018 0530   UROBILINOGEN 0.2 06/12/2014 0642   NITRITE NEGATIVE 03/28/2018 0530   LEUKOCYTESUR NEGATIVE 03/28/2018 0530   Sepsis Labs: No results found for this or any previous visit (from the past 240 hour(s)).   Radiological Exams on Admission: Mr Brain Wo Contrast  Result Date: 03/28/2018 CLINICAL DATA:  62 year old male with vertigo since 2000 hours yesterday. Symptoms exacerbated by movement. EXAM: MRI HEAD WITHOUT CONTRAST TECHNIQUE: Multiplanar, multiecho pulse sequences of the brain and surrounding structures were obtained without intravenous contrast. COMPARISON:  Head CT without contrast 04/19/2006. FINDINGS: Brain: Cerebral volume is within normal limits for age. No restricted diffusion to suggest acute infarction. No midline shift, mass effect, evidence of mass lesion, ventriculomegaly, extra-axial collection or acute intracranial hemorrhage. Cervicomedullary junction and pituitary are within normal limits. Wallace Cullens and white matter signal is within normal limits for age throughout the brain. No encephalomalacia or chronic cerebral blood products identified. The deep gray matter nuclei, brainstem, and cerebellum appear normal. Vascular: Major intracranial vascular flow voids are preserved. The distal left vertebral artery is dominant and tortuous. Mild intracranial artery tortuosity otherwise. Skull and upper cervical spine: Visualized bone marrow signal is within normal limits. Partially visible cervical spine degeneration. Sinuses/Orbits: Negative orbits soft tissues. Chronic sphenoid sinus mucoperiosteal thickening. Mild to moderate paranasal sinus mucosal thickening  elsewhere. Other: Mastoid air cells are clear. Grossly normal visible internal auditory structures. Normal stylomastoid foramina. Scalp and face soft tissues appear negative. IMPRESSION: 1. No acute intracranial abnormality and normal for age noncontrast MRI appearance of the brain. 2. Chronic paranasal sinus disease. Electronically Signed   By: Odessa Fleming M.D.   On: 03/28/2018 09:49    EKG: Independently reviewed.  Sinus rhythm at 67 bpm  Assessment/Plan Benign positional vertigo with vomiting: Acute.  Patient presents with complaints of room spinning around him that is worsened with changes in position.  MRI of the brain showed no acute abnormalities.  Symptoms could be secondary to recently started medications including hydroxyzine and Zyrtec - Admit to a telemetry bed - Check TSH - Continue meclizine prn Vertigo - Antiemetics as needed - IV fluids normal saline at 75 ml/hr x 1 liter - Physical therapy consult  Essential hypertension - Continue amlodipine - Hydralazine  IV prn sBP >180 or dBP >110  Gout - Continue allopurinol  Anxiety - Held hydroxyzine  BPH  - Continue Proscar  Obesity: BMI 36.26  DVT prophylaxis: Lovenox   Code Status: Full  Family Communication: Discussed plan of care with patient and family present at bedside Disposition Plan: Likely discharge home once medically stable Consults called:none Admission status: observation  Clydie Braun MD [facility] Pager [phone]   If 7PM-7AM, please contact night-coverage [email or web address] Password [facility]  03/28/2018, 12:34 PM

## 2018-03-28 NOTE — ED Notes (Signed)
Upon d/C patient sat up and stated "Im really dizzy and the room is spinning bad." Pt proceeded to projectile vomit; MD informed and stated patient to be admitted.

## 2018-03-28 NOTE — ED Notes (Signed)
MRI called states they will be coming to get the patient,.

## 2018-03-28 NOTE — Discharge Instructions (Signed)
MRI showed no serious findings.  Prescription for dizziness medication and medication to help you relax.  Rest in quiet room.

## 2018-03-28 NOTE — ED Provider Notes (Signed)
MRI brain negative for acute findings.  Patient feeling better.  Discharge medications meclizine 25 mg and Ativan 1 mg.  Discussed with patient and his family   Donnetta HutchingCook, Avyonna Wagoner, MD 03/28/18 [id number]

## 2018-03-28 NOTE — Progress Notes (Signed)
Pt does not feel nauseous and requests a cracker.

## 2018-03-28 NOTE — ED Notes (Signed)
Patient verbalizes understanding of discharge instructions. Opportunity for questioning and answers were provided. Armband removed by staff, pt discharged from ED via wheelchair. Pt discharged from ED; instructions provided and scripts given; Pt encouraged to return to ED if symptoms worsen and to f/u with PCP; Pt verbalized understanding of all instructions

## 2018-03-28 NOTE — ED Provider Notes (Signed)
Patient was being assessed for discharge.  He sat up from the bed, became very dizzy, vomited.  He does not feel he can be discharged home.  Will consult general medicine.   Donnetta Hutchingook, Paula Busenbark, MD 03/28/18 1217

## 2018-03-28 NOTE — ED Provider Notes (Signed)
TIME SEEN: 5:25 AM  CHIEF COMPLAINT: Vertigo, vomiting  HPI: Patient is a 62 year old male with history of hypertension, hyperlipidemia who presents to the emergency department with complaints of vertigo that started at 8 PM last night.  Symptoms started while at rest after eating dinner.  Worse with movement especially turning his head to the right.  No hearing loss, tinnitus, ear pain.  No numbness, tingling or focal weakness.  Not on anticoagulation.  Does take aspirin.  No history of stroke.  No speech or vision changes.  No headache.  No head injury.  Has never had vertigo before.  No recent fevers, diarrhea.  ROS: See HPI Constitutional: no fever  Eyes: no drainage  ENT: no runny nose   Cardiovascular:  no chest pain  Resp: no SOB  GI: no vomiting GU: no dysuria Integumentary: no rash  Allergy: no hives  Musculoskeletal: no leg swelling  Neurological: no slurred speech ROS otherwise negative  PAST MEDICAL HISTORY/PAST SURGICAL HISTORY:  Past Medical History:  Diagnosis Date  . Hyperlipidemia   . Hypertension     MEDICATIONS:  Prior to Admission medications   Medication Sig Start Date End Date Taking? Authorizing Provider  amLODipine-atorvastatin (CADUET) 10-40 MG per tablet Take 1 tablet by mouth daily.    [provider]  HYDROcodone-acetaminophen (NORCO/VICODIN) 5-325 MG per tablet Take 1 tablet by mouth every 4 (four) hours as needed. 12/18/14   Everlene Farrieransie, William, PA-C  ibuprofen (ADVIL,MOTRIN) 800 MG tablet Take 1 tablet (800 mg total) by mouth 3 (three) times daily. Patient not taking: Reported on 06/12/2014 06/19/12   Elson AreasSofia, Leslie K, PA-C  indomethacin (INDOCIN) 50 MG capsule Take 1 capsule (50 mg total) by mouth 3 (three) times daily with meals. 12/18/14   Everlene Farrieransie, William, PA-C  methocarbamol (ROBAXIN) 500 MG tablet Take 1-2 tablets (500-1,000 mg total) by mouth every 6 (six) hours as needed for muscle spasms. 07/22/15   Trixie DredgeWest, Emily, PA-C  oseltamivir (TAMIFLU) 75  MG capsule Take 1 capsule (75 mg total) by mouth every 12 (twelve) hours. 08/27/16   Margarita Grizzleay, Danielle, MD    ALLERGIES:  No Known Allergies  SOCIAL HISTORY:  Social History   Tobacco Use  . Smoking status: Former Smoker    Last attempt to quit: 07/21/1977    Years since quitting: 40.7  . Smokeless tobacco: Never Used  Substance Use Topics  . Alcohol use: Yes    Alcohol/week: 6.0 standard drinks    Types: 6 Cans of beer per week    FAMILY HISTORY: No family history on file.  EXAM: BP (!) 146/90   Pulse 70   Temp 98.1 F (36.7 C) (Oral)   Resp (!) 21   Ht 5\' 11"  (1.803 m)   Wt 117.9 kg   SpO2 97%   BMI 36.26 kg/m  CONSTITUTIONAL: Alert and oriented and responds appropriately to questions. Well-appearing; well-nourished HEAD: Normocephalic EYES: Conjunctivae clear, pupils appear equal, EOMI ENT: normal nose; moist mucous membranes NECK: Supple, no meningismus, no nuchal rigidity, no LAD  CARD: RRR; S1 and S2 appreciated; no murmurs, no clicks, no rubs, no gallops RESP: Normal chest excursion without splinting or tachypnea; breath sounds clear and equal bilaterally; no wheezes, no rhonchi, no rales, no hypoxia or respiratory distress, speaking full sentences ABD/GI: Normal bowel sounds; non-distended; soft, non-tender, no rebound, no guarding, no peritoneal signs, no hepatosplenomegaly BACK:  The back appears normal and is non-tender to palpation, there is no CVA tenderness EXT: Normal ROM in all joints; non-tender  to palpation; no edema; normal capillary refill; no cyanosis, no calf tenderness or swelling    SKIN: Normal color for age and race; warm; no rash NEURO: Moves all extremities equally, no pronator drift, sensation to light touch intact diffusely, no dysarthria or aphasia, oriented x3, cranial nerves II through XII intact, horizontal fatigable nystagmus when looking to the right PSYCH: The patient's mood and manner are appropriate. Grooming and personal hygiene are  appropriate.  MEDICAL DECISION MAKING: Patient here with what seems to be benign positional vertigo.  Symptoms present mostly when turning his head to the right.  Will obtain labs, urine.  At this time have low suspicion for stroke but he does have risk factors.  Will hold on imaging of the brain and treat symptomatically with IV fluids, Zofran, meclizine.  ED PROGRESS: Patient's labs, urine unremarkable.  Patient unable to keep down meclizine.  Will give dose of IV Ativan given medical low supply of Valium.  Will obtain MRI of the brain to rule out central causes of vertigo.  He is outside [facility] window.  Signed out to Dr. Adriana Simas to follow up on MRI results.   I reviewed all nursing notes, vitals, pertinent previous records, EKGs, lab and urine results, imaging (as available).       EKG Interpretation  Date/Time:  Thursday March 28 2018 05:15:40 EDT Ventricular Rate:  67 PR Interval:    QRS Duration: 97 QT Interval:  426 QTC Calculation: 450 R Axis:   42 Text Interpretation:  Sinus rhythm Minimal ST depression, inferior leads No significant change since last tracing Confirmed by Jesselle Laflamme, Baxter Hire [phone]) on 03/28/2018 5:25:15 AM         Shaunte Tuft, Layla Maw, DO 03/28/18 1914

## 2018-03-28 NOTE — ED Notes (Signed)
MD at bedside giving the results

## 2018-03-29 DIAGNOSIS — I10 Essential (primary) hypertension: Secondary | ICD-10-CM | POA: Diagnosis not present

## 2018-03-29 DIAGNOSIS — R42 Dizziness and giddiness: Secondary | ICD-10-CM | POA: Diagnosis not present

## 2018-03-29 DIAGNOSIS — E669 Obesity, unspecified: Secondary | ICD-10-CM | POA: Diagnosis not present

## 2018-03-29 DIAGNOSIS — H8110 Benign paroxysmal vertigo, unspecified ear: Secondary | ICD-10-CM | POA: Diagnosis not present

## 2018-03-29 LAB — BASIC METABOLIC PANEL
ANION GAP: 11 (ref 5–15)
BUN: 7 mg/dL — AB (ref 8–23)
CO2: 23 mmol/L (ref 22–32)
CREATININE: 0.98 mg/dL (ref 0.61–1.24)
Calcium: 8.7 mg/dL — ABNORMAL LOW (ref 8.9–10.3)
Chloride: 103 mmol/L (ref 98–111)
GFR calc Af Amer: >60 mL/min (ref >60)
GFR calc non Af Amer: >60 mL/min (ref >60)
Glucose, Bld: 113 mg/dL — ABNORMAL HIGH (ref 70–99)
POTASSIUM: 3.6 mmol/L (ref 3.5–5.1)
SODIUM: 137 mmol/L (ref 135–145)

## 2018-03-29 LAB — CBC
HCT: 45.5 % (ref 39.0–52.0)
Hemoglobin: 15.5 g/dL (ref 13.0–17.0)
MCH: 30.5 pg (ref 26.0–34.0)
MCHC: 34.1 g/dL (ref 30.0–36.0)
MCV: 89.4 fL (ref 78.0–100.0)
PLATELETS: 210 10*3/uL (ref 150–400)
RBC: 5.09 MIL/uL (ref 4.22–5.81)
RDW: 12.7 % (ref 11.5–15.5)
WBC: 6.9 10*3/uL (ref 4.0–10.5)

## 2018-03-29 LAB — GLUCOSE, CAPILLARY: GLUCOSE-CAPILLARY: 129 mg/dL — AB (ref 70–99)

## 2018-03-29 LAB — HIV ANTIBODY (ROUTINE TESTING W REFLEX): HIV SCREEN 4TH GENERATION: NONREACTIVE

## 2018-03-29 MED ORDER — MECLIZINE HCL 25 MG PO TABS
25.0000 mg | ORAL_TABLET | Freq: Three times a day (TID) | ORAL | Status: DC
  Administered 2018-03-29 – 2018-03-30 (×4): 25 mg via ORAL
  Filled 2018-03-29 (×7): qty 1

## 2018-03-29 MED ORDER — COLCHICINE 0.6 MG PO TABS
0.6000 mg | ORAL_TABLET | Freq: Every day | ORAL | Status: DC
  Administered 2018-03-29 – 2018-03-30 (×2): 0.6 mg via ORAL
  Filled 2018-03-29 (×2): qty 1

## 2018-03-29 MED ORDER — PROMETHAZINE HCL 25 MG/ML IJ SOLN
12.5000 mg | Freq: Four times a day (QID) | INTRAMUSCULAR | Status: DC | PRN
  Administered 2018-03-29: 12.5 mg via INTRAVENOUS
  Filled 2018-03-29: qty 1

## 2018-03-29 MED ORDER — SODIUM CHLORIDE 0.9 % IV SOLN
INTRAVENOUS | Status: DC
  Administered 2018-03-29 – 2018-03-30 (×2): via INTRAVENOUS

## 2018-03-29 MED ORDER — LISINOPRIL 40 MG PO TABS
40.0000 mg | ORAL_TABLET | Freq: Every day | ORAL | Status: DC
  Administered 2018-03-29 – 2018-03-30 (×2): 40 mg via ORAL
  Filled 2018-03-29 (×2): qty 1

## 2018-03-29 MED ORDER — AMLODIPINE BESYLATE 10 MG PO TABS
10.0000 mg | ORAL_TABLET | Freq: Every day | ORAL | Status: DC
  Administered 2018-03-29 – 2018-03-30 (×2): 10 mg via ORAL
  Filled 2018-03-29: qty 1

## 2018-03-29 NOTE — Plan of Care (Signed)
Pt alert and oriented x4. Skin warm and dry. Respirations equal and unlabored. Pt understands plan of care. Pt educated with techniques to reduce symptoms of vertigo. Pt remains to feel dizziness upon standing. Pt educated on on fall prevention methods and encouraged to ask nurse and family for assistance. Pt diagnostic tests and labs improving. Pt progressing towards discharge.

## 2018-03-29 NOTE — Progress Notes (Signed)
PROGRESS NOTE    Ricky OrionJerome Delis  ZOX:[id number] DOB: 04-Apr-1956 DOA: 03/28/2018 PCP: System, Pcp Not In  Outpatient Specialists:   Brief Narrative:  Ricky Bailey is a 62 y.o. male with medical history significant of HTN, HLD, gout, BPH, and obesity.  Patient was admitted with vertigo.  Patient has significant nausea and vomiting.  Patient continues to have nausea and vomiting.  MRI of the brain is negative for any acute intracranial process.  Will start patient on round-the-clock meclizine.  Continue to hydrate patient.  Continue supportive care for now.   Assessment & Plan:   Principal Problem:   BPPV (benign paroxysmal positional vertigo) Active Problems:   Vomiting   Benign essential HTN   Obesity (BMI 30-39.9)  Benign positional vertigo: Patient presents with complaints of room spinning around him that is worsened with changes in position.  MRI of the brain showed no acute abnormalities.  Symptoms could be secondary to recently started medications including hydroxyzine and Zyrtec - Admit to a telemetry bed - Check TSH - Antiemetics as needed - IV fluids normal saline at 75 ml/hr x 1 liter - Physical therapy consult 03/29/2018: Start meclizine 25 mg p.o. every 8 hourly (round-the-clock).  Phenergan as needed.  Optimize electrolytes.  Monitor QTc interval.  Check EKG in the morning to monitor QTc interval as patient is also on Phenergan.  Supportive care.  Essential hypertension: - Continue amlodipine - Hydralazine IV prn sBP >180 or dBP >110  Gout - Continue allopurinol  Anxiety - Held hydroxyzine  BPH  - Continue Proscar  Obesity:  BMI 36.26  DVT prophylaxis: Lovenox   Code Status: Full  Family Communication: Discussed plan of care with patient and family present at bedside Disposition Plan: Likely discharge home once medically stable  Consultants:   None  Procedures:   None  Antimicrobials:   None   Subjective: Vertigo Nausea and vomiting.     Objective: Vitals:   03/29/18 0605 03/29/18 0800 03/29/18 0806 03/29/18 0819  BP: (!) 169/97 (!) 175/111 (!) 185/121 (!) 157/99  Pulse: 72 67  90  Resp: 17 20  18   Temp: 97.7 F (36.5 C) 98.2 F (36.8 C)  98.4 F (36.9 C)  TempSrc: Oral Oral  Oral  SpO2: 100% 100%  100%  Weight: 118 kg     Height:        Intake/Output Summary (Last 24 hours) at 03/29/2018 1341 Last data filed at 03/28/2018 2152 Gross per 24 hour  Intake 211.22 ml  Output -  Net 211.22 ml   Filed Weights   03/28/18 0527 03/29/18 0605  Weight: 117.9 kg 118 kg    Examination:  General exam: Appears calm and comfortable.  Obese Respiratory system: Clear to auscultation.  Cardiovascular system: S1 & S2 heard Gastrointestinal system: Abdomen is morbidly obese, soft and nontender.  Organs are difficult to assess.  Normal bowel sounds heard. Central nervous system: Alert and oriented. No focal neurological deficits.  Data Reviewed: I have personally reviewed following labs and imaging studies  CBC: Recent Labs  Lab 03/28/18 0527 03/28/18 0528 03/29/18 0408  WBC 5.7  --  6.9  NEUTROABS 2.1  --   --   HGB 15.9 16.3 15.5  HCT 48.1 48.0 45.5  MCV 91.8  --  89.4  PLT 226  --  210   Basic Metabolic Panel: Recent Labs  Lab 03/28/18 0527 03/28/18 0528 03/29/18 0408  NA 139 140 137  K 3.8 3.7 3.6  CL 105 103 103  CO2 25  --  23  GLUCOSE 119* 121* 113*  BUN 9 9 7*  CREATININE 1.12 1.10 0.98  CALCIUM 9.2  --  8.7*   GFR: Estimated Creatinine Clearance: 102.1 mL/min (by C-G formula based on SCr of 0.98 mg/dL). Liver Function Tests: Recent Labs  Lab 03/28/18 0527  AST 27  ALT 38  ALKPHOS 56  BILITOT 1.1  PROT 7.2  ALBUMIN 4.0   No results for input(s): LIPASE, AMYLASE in the last 168 hours. No results for input(s): AMMONIA in the last 168 hours. Coagulation Profile: Recent Labs  Lab 03/28/18 0527  INR 0.98   Cardiac Enzymes: No results for input(s): CKTOTAL, CKMB, CKMBINDEX,  TROPONINI in the last 168 hours. BNP (last 3 results) No results for input(s): PROBNP in the last 8760 hours. HbA1C: No results for input(s): HGBA1C in the last 72 hours. CBG: Recent Labs  Lab 03/28/18 0519 03/29/18 0921  GLUCAP 111* 129*   Lipid Profile: No results for input(s): CHOL, HDL, LDLCALC, TRIG, CHOLHDL, LDLDIRECT in the last 72 hours. Thyroid Function Tests: Recent Labs    03/28/18 0527  TSH 1.033   Anemia Panel: No results for input(s): VITAMINB12, FOLATE, FERRITIN, TIBC, IRON, RETICCTPCT in the last 72 hours. Urine analysis:    Component Value Date/Time   COLORURINE STRAW (A) 03/28/2018 0530   APPEARANCEUR CLEAR 03/28/2018 0530   LABSPEC 1.008 03/28/2018 0530   PHURINE 8.0 03/28/2018 0530   GLUCOSEU NEGATIVE 03/28/2018 0530   HGBUR NEGATIVE 03/28/2018 0530   BILIRUBINUR NEGATIVE 03/28/2018 0530   KETONESUR NEGATIVE 03/28/2018 0530   PROTEINUR 30 (A) 03/28/2018 0530   UROBILINOGEN 0.2 06/12/2014 0642   NITRITE NEGATIVE 03/28/2018 0530   LEUKOCYTESUR NEGATIVE 03/28/2018 0530   Sepsis Labs: @LABRCNTIP (procalcitonin:4,lacticidven:4)  )No results found for this or any previous visit (from the past 240 hour(s)).       Radiology Studies: Mr Brain 60 Contrast  Result Date: 03/28/2018 CLINICAL DATA:  62 year old male with vertigo since 2000 hours yesterday. Symptoms exacerbated by movement. EXAM: MRI HEAD WITHOUT CONTRAST TECHNIQUE: Multiplanar, multiecho pulse sequences of the brain and surrounding structures were obtained without intravenous contrast. COMPARISON:  Head CT without contrast 04/19/2006. FINDINGS: Brain: Cerebral volume is within normal limits for age. No restricted diffusion to suggest acute infarction. No midline shift, mass effect, evidence of mass lesion, ventriculomegaly, extra-axial collection or acute intracranial hemorrhage. Cervicomedullary junction and pituitary are within normal limits. Wallace Cullens and white matter signal is within normal  limits for age throughout the brain. No encephalomalacia or chronic cerebral blood products identified. The deep gray matter nuclei, brainstem, and cerebellum appear normal. Vascular: Major intracranial vascular flow voids are preserved. The distal left vertebral artery is dominant and tortuous. Mild intracranial artery tortuosity otherwise. Skull and upper cervical spine: Visualized bone marrow signal is within normal limits. Partially visible cervical spine degeneration. Sinuses/Orbits: Negative orbits soft tissues. Chronic sphenoid sinus mucoperiosteal thickening. Mild to moderate paranasal sinus mucosal thickening elsewhere. Other: Mastoid air cells are clear. Grossly normal visible internal auditory structures. Normal stylomastoid foramina. Scalp and face soft tissues appear negative. IMPRESSION: 1. No acute intracranial abnormality and normal for age noncontrast MRI appearance of the brain. 2. Chronic paranasal sinus disease. Electronically Signed   By: Odessa Fleming M.D.   On: 03/28/2018 09:49        Scheduled Meds: . allopurinol  300 mg Oral Daily  . amLODipine  10 mg Oral Daily  . enoxaparin (LOVENOX) injection  40 mg Subcutaneous Q24H  . finasteride  5 mg Oral Daily  . lisinopril  40 mg Oral Daily  . meclizine  25 mg Oral TID  . sodium chloride flush  3 mL Intravenous Q12H   Continuous Infusions:   LOS: 0 days    Time spent: 25 minutes.    Berton Mount, MD  [facility] Pager #: [phone] 7PM-7AM contact night coverage as above

## 2018-03-29 NOTE — Evaluation (Signed)
Physical Therapy Evaluation/Vestibular Assessment Patient Details Name: Ricky Bailey MRN: [id number] DOB: [birth year] Today's Date: 03/29/2018   History of Present Illness  62 y.o. male admitted on 03/28/18 for acute dizziness, N/V.  MRI negative for acute events.  Suspected vestibular pathology.  Pt with other significant PMH of HTN, gout, back surgery.  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  Compensatory strategies initiated and pt is in need of heavy handed assist for mobility and RW use as of today.  I recommend, if he can, stay another evening and asked MD if addition of a steroid would be appropriate.  Wife and daughter are supportive and someone can be with him at the house.  Initially HHPT with a vestibular specialty would be best and transition to [facility] in [place] for vestibular f/u therapy after that (he lives in [place], so that is closest).   PT to follow acutely for deficits listed below.       Follow Up Recommendations Home health PT;Supervision for mobility/OOB;Other (comment)(HHPT vestibular specialist)    Equipment Recommendations  Rolling walker with 5" wheels    Recommendations for Other Services   NA    Precautions / Restrictions Precautions Precautions: Fall Precaution Comments: left LOB preference      Mobility  Bed Mobility Overal bed mobility: Needs Assistance Bed Mobility: Supine to Sit;Sit to Supine     Supine to sit: Min assist;HOB elevated Sit to supine: Min assist;HOB elevated   General bed mobility comments: Min assist to prevent LOB when coming to sitting, pt relying heavily on upper extremity help to sit up.   Transfers Overall transfer level: Needs assistance Equipment used: Rolling walker (2 wheeled);2 person hand held assist Transfers: Sit to/from Stand Sit to Stand: Mod assist;Min assist         General transfer comment: Mod assist with two person hand held assist tipping  to the left, min assist with use of RW.   Ambulation/Gait Ambulation/Gait assistance: Mod assist;+2 physical assistance;Min assist Gait Distance (Feet): 8 Feet Assistive device: Rolling walker (2 wheeled);2 person hand held assist Gait Pattern/deviations: Step-through pattern;Staggering left;Drifts right/left;Ataxic     General Gait Details: Attempted gait with two person hand held assist with heavy mod assist for balance, With RW pt could be heavy min assist, started compensatory strategies with eye patch (education to alternate eyes), targeting, and segmental turning.           Balance Overall balance assessment: Needs assistance Sitting-balance support: Feet supported;Bilateral upper extremity supported Sitting balance-Leahy Scale: Fair   Postural control: Left lateral lean Standing balance support: Bilateral upper extremity supported Standing balance-Leahy Scale: Poor Standing balance comment: needs external assist in standing with left lateral lean preference.         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 no h/o vertigo, no history of strabismus or corrective eye surgery.   Symptom Behavior  Type of Dizziness Spinning (double vision, imbalance)  Frequency of Dizziness nearly constant when upright (sitting or standing)  Duration of Dizziness long  Aggravating Factors Activity in general  Relieving Factors Head stationary;Lying supine;Closing eyes;Dark room  Occulomotor Exam  Occulomotor Alignment Normal  Spontaneous Right beating nystagmus  Gaze-induced Right beating nystagmus with  R gaze (follows alexander's law)  Smooth Pursuits Comment (rigid, but has nystagmus constantly, so looks  saccadic)  Saccades Intact (with intrusion from spontaneous nystagmus)  Comment Pt with saccadic-like movement difficult with testing due to spontaneous R beating nystgamus and double vision.   Vestibulo-Occular Reflex  VOR 1 Head Only (x 1 viewing) unable to do  Comment HIT (+) bil, but pt jummped and shut his eyes even with repeated testing.   Auditory  Comments hearing grossly equal bil.  Positional Testing  Dix-Hallpike  (testing not indicated due to spontaneous nystagmus)                          Pertinent Vitals/Pain Pain Assessment: No/denies pain    Home Living Family/patient expects to be discharged to:: Private residence Living Arrangements: Spouse/significant other;Children(wife and son (grown son).) Available Help at Discharge: Family;Available 24 hours/day(wife) Type of Home: House Home Access: Ramped entrance     Home Layout: Two level;Full bath on main level;Able to live on main level with bedroom/bathroom(pt normally stays on the main level of the home. 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    Extremity/Trunk Assessment   Upper Extremity Assessment Upper Extremity Assessment: Overall WFL for tasks assessed    Lower Extremity Assessment Lower Extremity Assessment: Overall WFL for tasks assessed    Cervical / Trunk Assessment Cervical / Trunk Assessment: Normal(h/o back surgery)  Communication   Communication: No difficulties  Cognition Arousal/Alertness: Awake/alert Behavior During Therapy: WFL for tasks assessed/performed Overall Cognitive Status: Within Functional Limits for tasks assessed                                        General Comments General comments (skin integrity, edema, etc.): Target "A"s given to daughter to hang up at home.         Assessment/Plan    PT  Assessment Patient needs continued PT services  PT Problem List Decreased activity tolerance;Decreased balance;Decreased mobility;Decreased knowledge of use of DME;Decreased safety awareness;Decreased knowledge of precautions       PT Treatment Interventions DME instruction;Gait training;Functional mobility training;Therapeutic activities;Therapeutic exercise;Balance training;Neuromuscular re-education;Patient/family education    PT Goals (Current goals can be found in the Care Plan section)  Acute Rehab PT Goals Patient Stated Goal: to feel normal again PT Goal Formulation: With patient Time For Goal Achievement: 04/12/18 Potential to Achieve Goals: Good    Frequency Min 4X/week           AM-PAC PT "6 Clicks" Daily Activity  Outcome Measure Difficulty turning over in bed (including adjusting bedclothes, sheets and blankets)?: Unable Difficulty moving from lying on back to sitting on the side of the bed? : Unable Difficulty sitting down on and standing up from a chair with arms (e.g., wheelchair, bedside commode, etc,.)?: Unable Help needed moving to and from a bed to chair (including a wheelchair)?: A Lot Help needed walking in hospital room?: A Lot Help needed climbing 3-5 steps with a railing? : A Lot 6 Click Score: 9    End of Session Equipment Utilized During Treatment: Gait belt Activity Tolerance: Other (comment)(limited by nausea, dizziness.) Patient left: in bed;with call bell/phone within reach;with family/visitor  present Nurse Communication: Mobility status PT Visit Diagnosis: Unsteadiness on feet (R26.81);Difficulty in walking, not elsewhere classified (R26.2);Dizziness and giddiness (R42)    Time: [id number] PT Time Calculation (min) (ACUTE ONLY): 47 min   Charges:           Lurena Joinerebecca B. Valyn Latchford, PT, DPT  Acute Rehabilitation [phone] pager #[phone] office   PT Evaluation $PT Eval Moderate Complexity: 1 Mod PT Treatments $Gait  Training: 8-22 mins $Self Care/Home Management: 8-22        03/29/2018, 11:09 AM

## 2018-03-29 NOTE — Progress Notes (Addendum)
Physical Therapy Treatment Patient Details Name: Ricky Bailey MRN: [id number] DOB: Jun 24, 1956 Today's Date: 03/29/2018    History of Present Illness 62 y.o. male admitted on 03/28/18 for acute dizziness, N/V.  MRI negative for acute events.  Suspected vestibular pathology.  Pt with other significant PMH of HTN, gout, back surgery.    PT Comments    Pt did a bit better this PM, reporting less nausea, no more double vision.  He was able to get up with me and walk to the bathroom with RW and mod two person assist.  He was able to start x 1 exercises and continue to practice his compensatory strategies (PT hung up target "A"s all around his room).  Vestibular PT to follow up tomorrow.    Follow Up Recommendations  Home health PT;Supervision for mobility/OOB;Other (comment)(HHPT with vestibular specialty )     Equipment Recommendations  Rolling walker with 5" wheels    Recommendations for Other Services   NA     Precautions / Restrictions Precautions Precautions: Fall Precaution Comments: left LOB preference    Mobility  Bed Mobility Overal bed mobility: Needs Assistance Bed Mobility: Supine to Sit;Sit to Supine     Supine to sit: Min guard;HOB elevated Sit to supine: Min guard;HOB elevated   General bed mobility comments: Min guard assist with HOB elevated.   Transfers Overall transfer level: Needs assistance Equipment used: Rolling walker (2 wheeled) Transfers: Sit to/from Stand Sit to Stand: Min assist;+2 physical assistance         General transfer comment: Min assist to boost up from bed for balance and to help stabilize RW, verbal cues for safe hand placement.   Ambulation/Gait Ambulation/Gait assistance: Mod assist;+2 physical assistance Gait Distance (Feet): 20 Feet Assistive device: Rolling walker (2 wheeled) Gait Pattern/deviations: Step-through pattern;Staggering left;Drifts right/left Gait velocity: decreased Gait velocity interpretation: <1.31 ft/sec,  indicative of household ambulator General Gait Details: Pt better on his feet, still using targeting and segmental turning to help with symptoms during gait.  No longer seems to have double vision, so we did not use the eye patch.  Pt was able to walk into the bathroom with the RW and use the bathroom in standing with up to mod assist (especially when turning left).         Balance Overall balance assessment: Needs assistance Sitting-balance support: Feet supported;Bilateral upper extremity supported Sitting balance-Leahy Scale: Fair Sitting balance - Comments: still slight left tilt in sitting Postural control: Left lateral lean Standing balance support: Bilateral upper extremity supported Standing balance-Leahy Scale: Poor Standing balance comment: needs external assist in standing with left lateral lean preference.         03/29/18 1822  Vestibular Assessment  General Observation Pt reporting less nausea (despite nausea meds due) and no double vision this PM.   Symptom Behavior  Type of Dizziness Spinning  Frequency of Dizziness nearly constant when upright (sitting or standing)  Duration of Dizziness long  Aggravating Factors Activity in general  Relieving Factors Head stationary;Lying supine;Closing eyes;Dark room  Occulomotor Exam  Occulomotor Alignment Normal (head tilted to the left with trunk)  Spontaneous Right beating nystagmus  Gaze-induced Right beating nystagmus with R gaze (follows Alexander's law)  Smooth Pursuits Comment (continue to have spontaneous nystagmus intrusions to smooth )  Saccades Intact (difficult to assess due to nystagmic intrusions)  Comment Pt with saccadic-like movement difficult with testing due to spontaneous R beating nystgamus.   Vestibulo-Occular Reflex  VOR 1 Head Only (x 1 viewing)  pt able to do this PM as he no longer has double vision, slow and symptomatic with both vertical and horizontal directions, so x1 seated exercises given for  HEP.   Comment test of skew with phoric movements/corrections of the eyes, but not vertical.                          Cognition Arousal/Alertness: Awake/alert Behavior During Therapy: WFL for tasks assessed/performed Overall Cognitive Status: Within Functional Limits for tasks assessed                                        Exercises Other Exercises Other Exercises: x1 seated exercises given with two target "A"s.  Reviewed verbally with pt, but since it was the end of the session and he was very symptomatic (nausea) we did not get the opportunity to practice.          Pertinent Vitals/Pain Pain Assessment: No/denies pain           PT Goals (current goals can now be found in the care plan section) Acute Rehab PT Goals Patient Stated Goal: to feel normal again Progress towards PT goals: Progressing toward goals    Frequency    Min 4X/week      PT Plan Current plan remains appropriate       AM-PAC PT "6 Clicks" Daily Activity  Outcome Measure  Difficulty turning over in bed (including adjusting bedclothes, sheets and blankets)?: A Lot Difficulty moving from lying on back to sitting on the side of the bed? : Unable Difficulty sitting down on and standing up from a chair with arms (e.g., wheelchair, bedside commode, etc,.)?: Unable Help needed moving to and from a bed to chair (including a wheelchair)?: A Lot Help needed walking in hospital room?: A Lot Help needed climbing 3-5 steps with a railing? : A Lot 6 Click Score: 10    End of Session Equipment Utilized During Treatment: Gait belt Activity Tolerance: Other (comment)(limited by nausea/dizziness. ) Patient left: in bed;with call bell/phone within reach;with family/visitor present Nurse Communication: Mobility status;Other (comment)(requested nausea meds) PT Visit Diagnosis: Unsteadiness on feet (R26.81);Difficulty in walking, not elsewhere classified (R26.2);Dizziness and giddiness  (R42)     Time: 1610-9604 PT Time Calculation (min) (ACUTE ONLY): 35 min  Charges:  $Gait Training: 8-22 mins $Therapeutic Activity: 8-22 mins          Ricky Bailey, PT, DPT  Acute Rehabilitation [phone] pager #[phone] office            03/29/2018, 6:26 PM

## 2018-03-29 NOTE — Plan of Care (Signed)
Pt resting and in no apparent distress at this time. Progressing appropriately.

## 2018-03-30 DIAGNOSIS — H8113 Benign paroxysmal vertigo, bilateral: Secondary | ICD-10-CM

## 2018-03-30 LAB — RENAL FUNCTION PANEL
Albumin: 3.9 g/dL (ref 3.5–5.0)
Anion gap: 8 (ref 5–15)
BUN: 10 mg/dL (ref 8–23)
CO2: 27 mmol/L (ref 22–32)
Calcium: 9.3 mg/dL (ref 8.9–10.3)
Chloride: 104 mmol/L (ref 98–111)
Creatinine, Ser: 1.11 mg/dL (ref 0.61–1.24)
GFR calc Af Amer: >60 mL/min (ref >60)
GFR calc non Af Amer: >60 mL/min (ref >60)
Glucose, Bld: 110 mg/dL — ABNORMAL HIGH (ref 70–99)
Phosphorus: 2.7 mg/dL (ref 2.5–4.6)
Potassium: 4.2 mmol/L (ref 3.5–5.1)
Sodium: 139 mmol/L (ref 135–145)

## 2018-03-30 LAB — MAGNESIUM: Magnesium: 2.1 mg/dL (ref 1.7–2.4)

## 2018-03-30 MED ORDER — COLCHICINE 0.6 MG PO TABS: 0.6000 mg | ORAL_TABLET | Freq: Every day | ORAL | 1 refills | Status: DC

## 2018-03-30 MED ORDER — LISINOPRIL 40 MG PO TABS: 40.0000 mg | ORAL_TABLET | Freq: Every day | ORAL | 0 refills | Status: DC

## 2018-03-30 MED ORDER — FINASTERIDE 5 MG PO TABS: 5.0000 mg | ORAL_TABLET | Freq: Every day | ORAL | 1 refills | Status: DC

## 2018-03-30 MED ORDER — AMLODIPINE BESYLATE 10 MG PO TABS: 10.0000 mg | ORAL_TABLET | Freq: Every day | ORAL | 0 refills | Status: DC

## 2018-03-30 MED ORDER — MECLIZINE HCL 25 MG PO TABS: 25.0000 mg | ORAL_TABLET | Freq: Three times a day (TID) | ORAL | 0 refills | Status: DC

## 2018-03-30 NOTE — Discharge Summary (Signed)
Physician Discharge Summary  Ricky Bailey RUE:[id number] DOB: 1956-05-21 DOA: 03/28/2018  PCP: Anselmo Pickler, MD  Admit date: 03/28/2018 Discharge date: 03/30/2018  Admitted From: Home Disposition: Home  Recommendations for Outpatient Follow-up:  1. Follow up with PCP in 1-2 weeks 2. Please obtain BMP/CBC in one week 3. Please follow up on the following pending results:  Home Health: Yes Equipment/Devices: None  Discharge Condition: Improved CODE STATUS: Full code Diet recommendation: Heart healthy  Brief/Interim Summary: 62 year old male with medical history significant for hypertension, hyperlipidemia, gout, benign prostatic hypertrophy, and obesity.  He was admitted with vertigo.  He had significant nausea and vomiting.  He continued to have nausea and vomiting and was started on meclizine. Started on meclizine after persistent vomiting despite IV fluids.  He was seen by physical therapy.  He was started on Epley maneuvers for management of BPPV and he had significant improvement.  He is now stable for discharge home.  He may return to work in 1 week or sooner depending on recommendation of his primary care physician.  Patient has reached maximal benefit of hospitalization.  Discharge diagnosis, prognosis, plans, follow-up, medications and treatments discussed with the patient(or responsible party) and is in agreement with the plans as described.  Patient is stable for discharge.  Discharge Diagnoses:  Principal Problem:   BPPV (benign paroxysmal positional vertigo) Active Problems:   Vomiting   Benign essential HTN   Obesity (BMI 30-39.9)    Discharge Instructions   Allergies as of 03/30/2018   No Known Allergies     Medication List    STOP taking these medications   ibuprofen 800 MG tablet Commonly known as:  ADVIL,MOTRIN   oseltamivir 75 MG capsule Commonly known as:  TAMIFLU     TAKE these medications   allopurinol 300 MG tablet Commonly known as:   ZYLOPRIM Take 300 mg by mouth daily.   amLODipine 10 MG tablet Commonly known as:  NORVASC Take 1 tablet (10 mg total) by mouth daily. Start taking on:  03/31/2018 What changed:    medication strength  how much to take   cetirizine 10 MG tablet Commonly known as:  ZYRTEC Take 10 mg by mouth daily.   colchicine 0.6 MG tablet Take 1 tablet (0.6 mg total) by mouth daily. Start taking on:  03/31/2018   finasteride 5 MG tablet Commonly known as:  PROSCAR Take 1 tablet (5 mg total) by mouth daily. Start taking on:  03/31/2018   HYDROcodone-acetaminophen 5-325 MG tablet Commonly known as:  NORCO/VICODIN Take 1 tablet by mouth every 4 (four) hours as needed.   hydrOXYzine 10 MG tablet Commonly known as:  ATARAX/VISTARIL Take 10 mg by mouth 2 (two) times daily.   indomethacin 50 MG capsule Commonly known as:  INDOCIN Take 1 capsule (50 mg total) by mouth 3 (three) times daily with meals.   lisinopril 40 MG tablet Commonly known as:  PRINIVIL,ZESTRIL Take 1 tablet (40 mg total) by mouth daily. Start taking on:  03/31/2018   LORazepam 1 MG tablet Commonly known as:  ATIVAN Take 1 tablet (1 mg total) by mouth 3 (three) times daily as needed for anxiety. Prn for dizziness   meclizine 25 MG tablet Commonly known as:  ANTIVERT Take 1 tablet (25 mg total) by mouth 3 (three) times daily as needed for dizziness.   meclizine 25 MG tablet Commonly known as:  ANTIVERT Take 1 tablet (25 mg total) by mouth 3 (three) times daily.   methocarbamol 500 MG tablet  Commonly known as:  ROBAXIN Take 1-2 tablets (500-1,000 mg total) by mouth every 6 (six) hours as needed for muscle spasms.   traMADol 50 MG tablet Commonly known as:  ULTRAM Take 50 mg by mouth 2 (two) times daily as needed for moderate pain.       No Known Allergies    Procedures/Studies: Mr Brain Wo Contrast  Result Date: 03/28/2018 CLINICAL DATA:  62 year old male with vertigo since 2000 hours yesterday. Symptoms  exacerbated by movement. EXAM: MRI HEAD WITHOUT CONTRAST TECHNIQUE: Multiplanar, multiecho pulse sequences of the brain and surrounding structures were obtained without intravenous contrast. COMPARISON:  Head CT without contrast 04/19/2006. FINDINGS: Brain: Cerebral volume is within normal limits for age. No restricted diffusion to suggest acute infarction. No midline shift, mass effect, evidence of mass lesion, ventriculomegaly, extra-axial collection or acute intracranial hemorrhage. Cervicomedullary junction and pituitary are within normal limits. Wallace CullensGray and white matter signal is within normal limits for age throughout the brain. No encephalomalacia or chronic cerebral blood products identified. The deep gray matter nuclei, brainstem, and cerebellum appear normal. Vascular: Major intracranial vascular flow voids are preserved. The distal left vertebral artery is dominant and tortuous. Mild intracranial artery tortuosity otherwise. Skull and upper cervical spine: Visualized bone marrow signal is within normal limits. Partially visible cervical spine degeneration. Sinuses/Orbits: Negative orbits soft tissues. Chronic sphenoid sinus mucoperiosteal thickening. Mild to moderate paranasal sinus mucosal thickening elsewhere. Other: Mastoid air cells are clear. Grossly normal visible internal auditory structures. Normal stylomastoid foramina. Scalp and face soft tissues appear negative. IMPRESSION: 1. No acute intracranial abnormality and normal for age noncontrast MRI appearance of the brain. 2. Chronic paranasal sinus disease. Electronically Signed   By: Odessa FlemingH  Hall M.D.   On: 03/28/2018 09:49        Subjective: Patient symptoms have improved.  He has ambulated in the hall.  He denies any current complaints.  He feels meclizine was significant and eating his recovery   Discharge Exam: Vitals:   03/30/18 0543 03/30/18 1143  BP: (!) 144/101 (!) 148/98  Pulse: 67 70  Resp: 16 16  Temp: 98.3 F (36.8 C) 98.9 F  (37.2 C)  SpO2: 98% 98%   Vitals:   03/29/18 1951 03/29/18 2353 03/30/18 0543 03/30/18 1143  BP: (!) 157/104 (!) 154/93 (!) 144/101 (!) 148/98  Pulse: 88 69 67 70  Resp: 16 16 16 16   Temp: 98.6 F (37 C) 98.1 F (36.7 C) 98.3 F (36.8 C) 98.9 F (37.2 C)  TempSrc: Oral Oral Oral Oral  SpO2: 98% 98% 98% 98%  Weight:      Height:        General: Pt is alert, awake, not in acute distress Cardiovascular: RRR, S1/S2 +, no rubs, no gallops Respiratory: CTA bilaterally, no wheezing, no rhonchi Abdominal: Soft, NT, ND, bowel sounds + Extremities: no edema, no cyanosis    The results of significant diagnostics from this hospitalization (including imaging, microbiology, ancillary and laboratory) are listed below for reference.     Microbiology: No results found for this or any previous visit (from the past 240 hour(s)).   Labs: BNP (last 3 results) No results for input(s): BNP in the last 8760 hours. Basic Metabolic Panel: Recent Labs  Lab 03/28/18 0527 03/28/18 0528 03/29/18 0408 03/30/18 0614  NA 139 140 137 139  K 3.8 3.7 3.6 4.2  CL 105 103 103 104  CO2 25  --  23 27  GLUCOSE 119* 121* 113* 110*  BUN 9 9  7* 10  CREATININE 1.12 1.10 0.98 1.11  CALCIUM 9.2  --  8.7* 9.3  MG  --   --   --  2.1  PHOS  --   --   --  2.7   Liver Function Tests: Recent Labs  Lab 03/28/18 0527 03/30/18 0614  AST 27  --   ALT 38  --   ALKPHOS 56  --   BILITOT 1.1  --   PROT 7.2  --   ALBUMIN 4.0 3.9   No results for input(s): LIPASE, AMYLASE in the last 168 hours. No results for input(s): AMMONIA in the last 168 hours. CBC: Recent Labs  Lab 03/28/18 0527 03/28/18 0528 03/29/18 0408  WBC 5.7  --  6.9  NEUTROABS 2.1  --   --   HGB 15.9 16.3 15.5  HCT 48.1 48.0 45.5  MCV 91.8  --  89.4  PLT 226  --  210   CBG: Recent Labs  Lab 03/28/18 0519 03/29/18 0921  GLUCAP 111* 129*   Thyroid function studies Recent Labs    03/28/18 0527  TSH 1.033   Urinalysis     Component Value Date/Time   COLORURINE STRAW (A) 03/28/2018 0530   APPEARANCEUR CLEAR 03/28/2018 0530   LABSPEC 1.008 03/28/2018 0530   PHURINE 8.0 03/28/2018 0530   GLUCOSEU NEGATIVE 03/28/2018 0530   HGBUR NEGATIVE 03/28/2018 0530   BILIRUBINUR NEGATIVE 03/28/2018 0530   KETONESUR NEGATIVE 03/28/2018 0530   PROTEINUR 30 (A) 03/28/2018 0530   UROBILINOGEN 0.2 06/12/2014 0642   NITRITE NEGATIVE 03/28/2018 0530   LEUKOCYTESUR NEGATIVE 03/28/2018 0530     Time coordinating discharge: 42 minutes  SIGNED:   Lahoma Crocker, MD  FACP [facility] 03/30/2018, 12:32 PM Pager   If 7PM-7AM, please contact night-coverage [email or web address] Password [email or web address]

## 2018-03-30 NOTE — Progress Notes (Signed)
Physical Therapy Treatment Patient Details Name: Ricky Bailey MRN: [id number] DOB: 04-19-1956 Today's Date: 03/30/2018    History of Present Illness 62 y.o. male admitted on 03/28/18 for acute dizziness, N/V.  MRI negative for acute events.  Suspected vestibular pathology.  Pt with other significant PMH of HTN, gout, back surgery.    PT Comments    Patient doing better today, able to tolerate ambulating with Supervision in hallway with RW. Pt reports dizziness but no longer vertigo during session, unable to illicit vertigo, R beating nystagmus noted. Canal testing negative for BPPV. Pt and wife feel ready to return home, extensive discussion regarding vestibular rehab progression and expectations.     Follow Up Recommendations  Home health PT;Supervision for mobility/OOB;Other (comment)(HHPT with [facility] )     Equipment Recommendations  Rolling walker with 5" wheels    Recommendations for Other Services       Precautions / Restrictions Precautions Precautions: Fall Precaution Comments: left LOB preference    Mobility  Bed Mobility Overal bed mobility: Needs Assistance Bed Mobility: Supine to Sit;Sit to Supine     Supine to sit: Min guard Sit to supine: Min guard   General bed mobility comments: Min guard assist with HOB elevated.   Transfers Overall transfer level: Needs assistance Equipment used: Rolling walker (2 wheeled) Transfers: Sit to/from Stand Sit to Stand: Min guard         General transfer comment: min guard able to stand on his own with RW  Ambulation/Gait Ambulation/Gait assistance: Supervision Gait Distance (Feet): 150 Feet Assistive device: Rolling walker (2 wheeled) Gait Pattern/deviations: Step-to pattern Gait velocity: decreased   General Gait Details: pt with mild path deviation to R while walking. walking with supervision use of RW no overt LOB   Stairs             Wheelchair Mobility    Modified Rankin (Stroke  Patients Only)       Balance Overall balance assessment: Needs assistance Sitting-balance support: Feet supported;Bilateral upper extremity supported Sitting balance-Leahy Scale: Fair     Standing balance support: Bilateral upper extremity supported Standing balance-Leahy Scale: Poor                              Cognition Arousal/Alertness: Awake/alert Behavior During Therapy: WFL for tasks assessed/performed Overall Cognitive Status: Within Functional Limits for tasks assessed                                        Exercises Other Exercises Other Exercises: spent time going over x1 exercises, pt toelrating 15 second reps, discussed  technique and things to be mindufl of wihile progressing throughout the next several days. Pt and wife express proper understanding.     General Comments General comments (skin integrity, edema, etc.): canal testing negative. no direction changing nystgamus, R beating only      Pertinent Vitals/Pain Pain Assessment: No/denies pain    Home Living                      Prior Function            PT Goals (current goals can now be found in the care plan section) Acute Rehab PT Goals Patient Stated Goal: to feel normal again PT Goal Formulation: With patient Time For Goal Achievement: 04/12/18 Potential to Achieve  Goals: Good Progress towards PT goals: Progressing toward goals    Frequency    Min 4X/week      PT Plan Current plan remains appropriate    Co-evaluation              AM-PAC PT "6 Clicks" Daily Activity  Outcome Measure  Difficulty turning over in bed (including adjusting bedclothes, sheets and blankets)?: A Little Difficulty moving from lying on back to sitting on the side of the bed? : A Little Difficulty sitting down on and standing up from a chair with arms (e.g., wheelchair, bedside commode, etc,.)?: A Little Help needed moving to and from a bed to chair (including a  wheelchair)?: A Little Help needed walking in hospital room?: A Little Help needed climbing 3-5 steps with a railing? : A Lot 6 Click Score: 17    End of Session Equipment Utilized During Treatment: Gait belt Activity Tolerance: Patient tolerated treatment well Patient left: in bed;with call bell/phone within reach;with family/visitor present Nurse Communication: Mobility status;Other (comment) PT Visit Diagnosis: Unsteadiness on feet (R26.81);Difficulty in walking, not elsewhere classified (R26.2);Dizziness and giddiness (R42)     Time: 2956-2130 PT Time Calculation (min) (ACUTE ONLY): 47 min  Charges:  $Gait Training: 8-22 mins $Therapeutic Exercise: 8-22 mins                    Etta Grandchild, PT, DPT Acute Rehabilitation Services Pager: [phone] Office: [phone]     Etta Grandchild 03/30/2018, 12:40 PM

## 2018-03-30 NOTE — Care Management Note (Addendum)
Case Management Note  Patient Details  Name: Ricky Bailey MRN: [id number] Date of Birth: [birth year]  Subjective/Objective:      Pt presented for vertigo and has improved with PT intervention of vestibular maneuvers.  Pt from home with wife and uses a cane, but independent with ADLs.  Pt works and has [employer] insurance.  Pt reports no barriers to healthcare.  [facility]               Action/Plan: HH agency choice made by pt and wife and choose [facility].  [facility]  [facility]  Expected Discharge Date:       03/30/18         Expected Discharge Plan:  Home w Home Health Services  In-House Referral:  NA  Discharge planning Services  CM Consult  Post Acute Care Choice:  Durable Medical Equipment, Home Health Choice offered to:  Patient, Spouse  DME Arranged:  Walker rolling DME Agency:  [facility]  HH Arranged:  [facility]  [facility]  Status of Service:  Completed, signed off  If discussed at Long Length of Stay Meetings, dates discussed:    Additional Comments:  Deveron Furlongshley  Shereka Lafortune, RN 03/30/2018, 12:49 PM

## 2019-07-01 ENCOUNTER — Encounter: Payer: Self-pay | Admitting: Gastroenterology

## 2019-08-14 ENCOUNTER — Encounter (HOSPITAL_COMMUNITY): Payer: Self-pay | Admitting: Emergency Medicine

## 2019-08-14 ENCOUNTER — Inpatient Hospital Stay (HOSPITAL_COMMUNITY)
Admission: EM | Admit: 2019-08-14 | Discharge: 2019-08-19 | DRG: 177 | Disposition: A | Discharge status: Home / Self Care | Attending: Internal Medicine | Admitting: Internal Medicine

## 2019-08-14 ENCOUNTER — Other Ambulatory Visit: Payer: Self-pay

## 2019-08-14 ENCOUNTER — Emergency Department (HOSPITAL_COMMUNITY)

## 2019-08-14 DIAGNOSIS — H8110 Benign paroxysmal vertigo, unspecified ear: Secondary | ICD-10-CM | POA: Diagnosis present

## 2019-08-14 DIAGNOSIS — M109 Gout, unspecified: Secondary | ICD-10-CM | POA: Diagnosis present

## 2019-08-14 DIAGNOSIS — Z6835 Body mass index (BMI) 35.0-35.9, adult: Secondary | ICD-10-CM | POA: Diagnosis not present

## 2019-08-14 DIAGNOSIS — E7800 Pure hypercholesterolemia, unspecified: Secondary | ICD-10-CM

## 2019-08-14 DIAGNOSIS — R7611 Nonspecific reaction to tuberculin skin test without active tuberculosis: Secondary | ICD-10-CM

## 2019-08-14 DIAGNOSIS — J1282 Pneumonia due to coronavirus disease 2019: Secondary | ICD-10-CM

## 2019-08-14 DIAGNOSIS — Z79899 Other long term (current) drug therapy: Secondary | ICD-10-CM | POA: Diagnosis not present

## 2019-08-14 DIAGNOSIS — R0902 Hypoxemia: Secondary | ICD-10-CM

## 2019-08-14 DIAGNOSIS — E860 Dehydration: Secondary | ICD-10-CM | POA: Diagnosis present

## 2019-08-14 DIAGNOSIS — E871 Hypo-osmolality and hyponatremia: Secondary | ICD-10-CM

## 2019-08-14 DIAGNOSIS — Z87891 Personal history of nicotine dependence: Secondary | ICD-10-CM | POA: Diagnosis not present

## 2019-08-14 DIAGNOSIS — U071 COVID-19: Principal | ICD-10-CM

## 2019-08-14 DIAGNOSIS — I10 Essential (primary) hypertension: Secondary | ICD-10-CM

## 2019-08-14 DIAGNOSIS — D72819 Decreased white blood cell count, unspecified: Secondary | ICD-10-CM | POA: Diagnosis present

## 2019-08-14 DIAGNOSIS — E785 Hyperlipidemia, unspecified: Secondary | ICD-10-CM | POA: Diagnosis present

## 2019-08-14 DIAGNOSIS — E669 Obesity, unspecified: Secondary | ICD-10-CM | POA: Diagnosis present

## 2019-08-14 DIAGNOSIS — J9601 Acute respiratory failure with hypoxia: Secondary | ICD-10-CM

## 2019-08-14 DIAGNOSIS — E876 Hypokalemia: Secondary | ICD-10-CM | POA: Diagnosis present

## 2019-08-14 DIAGNOSIS — N400 Enlarged prostate without lower urinary tract symptoms: Secondary | ICD-10-CM

## 2019-08-14 DIAGNOSIS — J9600 Acute respiratory failure, unspecified whether with hypoxia or hypercapnia: Secondary | ICD-10-CM

## 2019-08-14 LAB — CBC WITH DIFFERENTIAL/PLATELET
Abs Immature Granulocytes: 0.03 10*3/uL (ref 0.00–0.07)
Basophils Absolute: 0 10*3/uL (ref 0.0–0.1)
Basophils Relative: 0 %
Eosinophils Absolute: 0 10*3/uL (ref 0.0–0.5)
Eosinophils Relative: 0 %
HCT: 40 % (ref 39.0–52.0)
Hemoglobin: 13.5 g/dL (ref 13.0–17.0)
Immature Granulocytes: 1 %
Lymphocytes Relative: 18 %
Lymphs Abs: 0.9 10*3/uL (ref 0.7–4.0)
MCH: 30.1 pg (ref 26.0–34.0)
MCHC: 33.8 g/dL (ref 30.0–36.0)
MCV: 89.1 fL (ref 80.0–100.0)
Monocytes Absolute: 0.3 10*3/uL (ref 0.1–1.0)
Monocytes Relative: 6 %
Neutro Abs: 3.6 10*3/uL (ref 1.7–7.7)
Neutrophils Relative %: 75 %
Platelets: 145 10*3/uL — ABNORMAL LOW (ref 150–400)
RBC: 4.49 MIL/uL (ref 4.22–5.81)
RDW: 12.7 % (ref 11.5–15.5)
WBC: 4.8 10*3/uL (ref 4.0–10.5)
nRBC: 0 % (ref 0.0–0.2)

## 2019-08-14 LAB — PROCALCITONIN: Procalcitonin: <0.1 ng/mL

## 2019-08-14 LAB — TRIGLYCERIDES: Triglycerides: 55 mg/dL (ref <150)

## 2019-08-14 LAB — COMPREHENSIVE METABOLIC PANEL
ALT: 43 U/L (ref 0–44)
AST: 55 U/L — ABNORMAL HIGH (ref 15–41)
Albumin: 3.5 g/dL (ref 3.5–5.0)
Alkaline Phosphatase: 38 U/L (ref 38–126)
Anion gap: 12 (ref 5–15)
BUN: 17 mg/dL (ref 8–23)
CO2: 23 mmol/L (ref 22–32)
Calcium: 8.1 mg/dL — ABNORMAL LOW (ref 8.9–10.3)
Chloride: 95 mmol/L — ABNORMAL LOW (ref 98–111)
Creatinine, Ser: 1.14 mg/dL (ref 0.61–1.24)
GFR calc Af Amer: >60 mL/min (ref >60)
GFR calc non Af Amer: >60 mL/min (ref >60)
Glucose, Bld: 119 mg/dL — ABNORMAL HIGH (ref 70–99)
Potassium: 3.2 mmol/L — ABNORMAL LOW (ref 3.5–5.1)
Sodium: 130 mmol/L — ABNORMAL LOW (ref 135–145)
Total Bilirubin: 1.2 mg/dL (ref 0.3–1.2)
Total Protein: 7.4 g/dL (ref 6.5–8.1)

## 2019-08-14 LAB — FIBRINOGEN: Fibrinogen: 717 mg/dL — ABNORMAL HIGH (ref 210–475)

## 2019-08-14 LAB — C-REACTIVE PROTEIN: CRP: 14.8 mg/dL — ABNORMAL HIGH (ref <1.0)

## 2019-08-14 LAB — LACTATE DEHYDROGENASE: LDH: 315 U/L — ABNORMAL HIGH (ref 98–192)

## 2019-08-14 LAB — FERRITIN: Ferritin: 1393 ng/mL — ABNORMAL HIGH (ref 24–336)

## 2019-08-14 LAB — LACTIC ACID, PLASMA: Lactic Acid, Venous: 1 mmol/L (ref 0.5–1.9)

## 2019-08-14 LAB — D-DIMER, QUANTITATIVE: D-Dimer, Quant: 1.26 ug/mL-FEU — ABNORMAL HIGH (ref 0.00–0.50)

## 2019-08-14 LAB — POC SARS CORONAVIRUS 2 AG -  ED: SARS Coronavirus 2 Ag: POSITIVE — AB

## 2019-08-14 MED ORDER — ALLOPURINOL 100 MG PO TABS
300.0000 mg | ORAL_TABLET | Freq: Every day | ORAL | Status: DC
  Administered 2019-08-15 – 2019-08-19 (×5): 300 mg via ORAL
  Filled 2019-08-14 (×5): qty 3

## 2019-08-14 MED ORDER — ENOXAPARIN SODIUM 40 MG/0.4ML ~~LOC~~ SOLN
40.0000 mg | SUBCUTANEOUS | Status: DC
  Administered 2019-08-14: 40 mg via SUBCUTANEOUS
  Filled 2019-08-14: qty 0.4

## 2019-08-14 MED ORDER — ROSUVASTATIN CALCIUM 5 MG PO TABS
20.0000 mg | ORAL_TABLET | Freq: Every day | ORAL | Status: DC
  Administered 2019-08-14 – 2019-08-19 (×6): 20 mg via ORAL
  Filled 2019-08-14 (×2): qty 1
  Filled 2019-08-14: qty 4
  Filled 2019-08-14 (×3): qty 1

## 2019-08-14 MED ORDER — POTASSIUM CHLORIDE CRYS ER 20 MEQ PO TBCR
40.0000 meq | EXTENDED_RELEASE_TABLET | Freq: Once | ORAL | Status: AC
  Administered 2019-08-14: 40 meq via ORAL
  Filled 2019-08-14: qty 2

## 2019-08-14 MED ORDER — ALBUTEROL SULFATE HFA 108 (90 BASE) MCG/ACT IN AERS
1.0000 | INHALATION_SPRAY | Freq: Four times a day (QID) | RESPIRATORY_TRACT | Status: DC
  Administered 2019-08-14 – 2019-08-19 (×20): 1 via RESPIRATORY_TRACT
  Filled 2019-08-14: qty 6.7

## 2019-08-14 MED ORDER — HYDROXYZINE HCL 10 MG PO TABS
10.0000 mg | ORAL_TABLET | Freq: Three times a day (TID) | ORAL | Status: DC | PRN
  Filled 2019-08-14 (×2): qty 1

## 2019-08-14 MED ORDER — SODIUM CHLORIDE 0.9 % IV SOLN
100.0000 mg | Freq: Every day | INTRAVENOUS | Status: AC
  Administered 2019-08-15 – 2019-08-18 (×4): 100 mg via INTRAVENOUS
  Filled 2019-08-14 (×4): qty 20

## 2019-08-14 MED ORDER — DEXAMETHASONE SODIUM PHOSPHATE 10 MG/ML IJ SOLN
6.0000 mg | Freq: Every day | INTRAMUSCULAR | Status: DC
  Administered 2019-08-14: 6 mg via INTRAVENOUS
  Filled 2019-08-14: qty 1

## 2019-08-14 MED ORDER — FINASTERIDE 5 MG PO TABS
5.0000 mg | ORAL_TABLET | Freq: Every day | ORAL | Status: DC
  Administered 2019-08-14 – 2019-08-19 (×6): 5 mg via ORAL
  Filled 2019-08-14 (×6): qty 1

## 2019-08-14 MED ORDER — ALBUTEROL SULFATE HFA 108 (90 BASE) MCG/ACT IN AERS
1.0000 | INHALATION_SPRAY | RESPIRATORY_TRACT | Status: DC | PRN
  Administered 2019-08-16: 1 via RESPIRATORY_TRACT
  Filled 2019-08-14: qty 6.7

## 2019-08-14 MED ORDER — TRAMADOL HCL 50 MG PO TABS
50.0000 mg | ORAL_TABLET | Freq: Two times a day (BID) | ORAL | Status: DC | PRN
  Administered 2019-08-14: 50 mg via ORAL
  Filled 2019-08-14: qty 1

## 2019-08-14 MED ORDER — SODIUM CHLORIDE 0.9 % IV SOLN
200.0000 mg | Freq: Once | INTRAVENOUS | Status: AC
  Administered 2019-08-14: 16:00:00 200 mg via INTRAVENOUS
  Filled 2019-08-14: qty 200

## 2019-08-14 MED ORDER — DEXAMETHASONE SODIUM PHOSPHATE 10 MG/ML IJ SOLN: 6.0000 mg | Freq: Two times a day (BID) | INTRAMUSCULAR | Status: DC

## 2019-08-14 MED ORDER — SODIUM CHLORIDE 0.9 % IV SOLN
INTRAVENOUS | Status: DC
  Filled 2019-08-14 (×12): qty 1000

## 2019-08-14 MED ORDER — MECLIZINE HCL 25 MG PO TABS
25.0000 mg | ORAL_TABLET | Freq: Two times a day (BID) | ORAL | Status: DC | PRN
  Filled 2019-08-14: qty 1

## 2019-08-14 MED ORDER — ACETAMINOPHEN 325 MG PO TABS
650.0000 mg | ORAL_TABLET | Freq: Four times a day (QID) | ORAL | Status: DC | PRN
  Administered 2019-08-14: 650 mg via ORAL
  Filled 2019-08-14: qty 2

## 2019-08-14 MED ORDER — LOPERAMIDE HCL 2 MG PO CAPS: 2.0000 mg | ORAL_CAPSULE | ORAL | Status: DC | PRN

## 2019-08-14 NOTE — ED Provider Notes (Signed)
[facility] DEPT Provider Note   CSN: [id number] Arrival date & time: 08/14/19  1224     History Chief Complaint  Patient presents with  . Covid positive  . Shortness of Breath    Ricky Bailey is a 64 y.o. male with PMH of HTN and HLD presents to the ED with worsening shortness of breath symptoms in setting of recent COVID-19 diagnosis.  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  His wife tested positive for COVID-19 that day and he tested positive at a urgent care in [place], [place] yesterday.  He endorses intermittent fevers and chills, generalized body aches, dyspnea particular on exertion, loose nonbloody stools, and mild weakness.  He reports that his shortness of breath is progressively worsening rather than sudden onset.  He denies any dizziness, sore throat, chest pain, cough, abdominal pain, nausea or vomiting, or other symptoms.   HPI     Past Medical History:  Diagnosis Date  . BPH (benign prostatic hyperplasia)   . Gout   . Hyperlipidemia   . Hypertension   . Obesity   . Vertigo 03/28/2018   with vommiting    Patient Active Problem List   Diagnosis Date Noted  . BPPV (benign paroxysmal positional vertigo) 03/28/2018  . Vomiting 03/28/2018  . Benign essential HTN 03/28/2018  . Obesity (BMI 30-39.9) 03/28/2018    Past Surgical History:  Procedure Laterality Date  . BACK SURGERY         Family History  Problem Relation Age of Onset  . Heart disease Mother   . Lung cancer Father     Social History   Tobacco Use  . Smoking status: Former Smoker    Quit date: 07/21/1977    Years since quitting: 42.0  . Smokeless tobacco: Never Used  Substance Use Topics  . Alcohol use: Yes    Alcohol/week: 6.0 standard drinks    Types: 6 Cans of beer per week  . Drug use: No    Home Medications Prior to Admission medications   Medication Sig Start Date End Date Taking? Authorizing  Provider  allopurinol (ZYLOPRIM) 300 MG tablet Take 300 mg by mouth daily. 03/07/18   [provider]  amLODipine (NORVASC) 10 MG tablet Take 1 tablet (10 mg total) by mouth daily. 03/31/18   Lady Deutscher, MD  cetirizine (ZYRTEC) 10 MG tablet Take 10 mg by mouth daily. 03/21/18   [provider]  colchicine 0.6 MG tablet Take 1 tablet (0.6 mg total) by mouth daily. 03/31/18   Lady Deutscher, MD  finasteride (PROSCAR) 5 MG tablet Take 1 tablet (5 mg total) by mouth daily. 03/31/18   Lady Deutscher, MD  HYDROcodone-acetaminophen (NORCO/VICODIN) 5-325 MG per tablet Take 1 tablet by mouth every 4 (four) hours as needed. Patient not taking: Reported on 03/28/2018 12/18/14   Waynetta Pean, PA-C  hydrOXYzine (ATARAX/VISTARIL) 10 MG tablet Take 10 mg by mouth 2 (two) times daily. 03/21/18   [provider]  indomethacin (INDOCIN) 50 MG capsule Take 1 capsule (50 mg total) by mouth 3 (three) times daily with meals. Patient not taking: Reported on 03/28/2018 12/18/14   Waynetta Pean, PA-C  lisinopril (PRINIVIL,ZESTRIL) 40 MG tablet Take 1 tablet (40 mg total) by mouth daily. 03/31/18   Lady Deutscher, MD  LORazepam (ATIVAN) 1 MG tablet Take 1 tablet (1 mg total) by mouth 3 (three) times daily as needed for anxiety. Prn for dizziness 03/28/18  Donnetta Hutching, MD  meclizine (ANTIVERT) 25 MG tablet Take 1 tablet (25 mg total) by mouth 3 (three) times daily as needed for dizziness. 03/28/18   Donnetta Hutching, MD  meclizine (ANTIVERT) 25 MG tablet Take 1 tablet (25 mg total) by mouth 3 (three) times daily. 03/30/18   Lahoma Crocker, MD  methocarbamol (ROBAXIN) 500 MG tablet Take 1-2 tablets (500-1,000 mg total) by mouth every 6 (six) hours as needed for muscle spasms. Patient not taking: Reported on 03/28/2018 07/22/15   Trixie Dredge, PA-C  traMADol (ULTRAM) 50 MG tablet Take 50 mg by mouth 2 (two) times daily as needed for moderate pain.  03/07/18   [provider]     Allergies    Patient has no known allergies.  Review of Systems   Review of Systems  All other systems reviewed and are negative.   Physical Exam Updated Vital Signs BP 113/66   Pulse 75   Temp 99.5 F (37.5 C)   Resp (!) 22   SpO2 94%   Physical Exam Vitals and nursing note reviewed. Exam conducted with a chaperone present.  Constitutional:      Appearance: Normal appearance. He is ill-appearing.  HENT:     Head: Normocephalic and atraumatic.  Eyes:     General: No scleral icterus.    Conjunctiva/sclera: Conjunctivae normal.  Cardiovascular:     Rate and Rhythm: Normal rate and regular rhythm.     Pulses: Normal pulses.     Heart sounds: Normal heart sounds.  Pulmonary:     Comments: Mildly increased work of breathing.  No respiratory distress or accessory muscle use.  Breath sounds intact bilaterally with +/- rales in lower lobes.  Able to speak full sentences.  3 L nasal cannula. Abdominal:     General: Abdomen is flat. There is no distension.     Palpations: Abdomen is soft.     Tenderness: There is no abdominal tenderness. There is no guarding.  Musculoskeletal:     Cervical back: Normal range of motion and neck supple. No rigidity.  Skin:    General: Skin is dry.     Capillary Refill: Capillary refill takes less than 2 seconds.  Neurological:     General: No focal deficit present.     Mental Status: He is alert and oriented to person, place, and time.     GCS: GCS eye subscore is 4. GCS verbal subscore is 5. GCS motor subscore is 6.  Psychiatric:        Mood and Affect: Mood normal.        Behavior: Behavior normal.        Thought Content: Thought content normal.     ED Results / Procedures / Treatments   Labs (all labs ordered are listed, but only abnormal results are displayed) Labs Reviewed  CBC WITH DIFFERENTIAL/PLATELET - Abnormal; Notable for the following components:      Result Value   Platelets 145 (*)    All other components within  normal limits  COMPREHENSIVE METABOLIC PANEL - Abnormal; Notable for the following components:   Sodium 130 (*)    Potassium 3.2 (*)    Chloride 95 (*)    Glucose, Bld 119 (*)    Calcium 8.1 (*)    AST 55 (*)    All other components within normal limits  LACTATE DEHYDROGENASE - Abnormal; Notable for the following components:   LDH 315 (*)    All other components within normal limits  FERRITIN - Abnormal; Notable for the following components:   Ferritin 1,393 (*)    All other components within normal limits  C-REACTIVE PROTEIN - Abnormal; Notable for the following components:   CRP 14.8 (*)    All other components within normal limits  POC SARS CORONAVIRUS 2 AG -  ED - Abnormal; Notable for the following components:   SARS Coronavirus 2 Ag POSITIVE (*)    All other components within normal limits  CULTURE, BLOOD (ROUTINE X 2)  CULTURE, BLOOD (ROUTINE X 2)  LACTIC ACID, PLASMA  TRIGLYCERIDES  LACTIC ACID, PLASMA  D-DIMER, QUANTITATIVE (NOT AT [facility])  PROCALCITONIN  FIBRINOGEN    EKG None  Radiology DG Chest Port 1 View  Result Date: 08/14/2019 CLINICAL DATA:  COVID-19 positive yesterday, short of breath, body aches, diarrhea EXAM: PORTABLE CHEST 1 VIEW COMPARISON:  09/21/2017 FINDINGS: Single frontal view of the chest demonstrates multifocal bilateral interstitial and ground-glass opacities greatest in the right perihilar and left infrahilar regions. Overall, findings are consistent with COVID-19 pneumonia. No effusion or pneumothorax. Cardiac silhouette is unremarkable. IMPRESSION: Findings consistent with COVID-19 pneumonia. Electronically Signed   By: Sharlet Salina M.D.   On: 08/14/2019 13:39    Procedures Procedures (including critical care time)  Medications Ordered in ED Medications  potassium chloride SA (KLOR-CON) CR tablet 40 mEq (has no administration in time range)    ED Course  I have reviewed the triage vital signs and the nursing notes.  Pertinent labs &  imaging results that were available during my care of the patient were reviewed by me and considered in my medical decision making (see chart for details).  Clinical Course as of Aug 13 1418  Thu Aug 14, 2019  1419 Spoke with Dr. Lajuana Ripple who will evaluate and admit patient.   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  He also denies any chest pain or pleuritic cough.  We will obtain repeat rapid COVID-19 test given inability to find his positive results.  Patient does not have any significant pulmonary history and denies any prior oxygen requirements.  He was 87% on arrival before being placed on 3 L [place].  I personally retested him and he again dropped to 87% on RA while resting comfortably in bed.  Patient will need to be admitted given his new oxygen requirement in setting of COVID-19 infection.  DG chest obtained demonstrates multifocal groundglass opacities consistent with a COVID-19 pneumonia.  CMP demonstrates hypokalemia to 3.2 as well as hyponatremia to 130.  Will hold off on 1 L NS given concern for worsening respiratory status.  Instead, will replenish potassium with 40 mEq K-Dur.  Spoke with Dr. Lajuana Ripple who will evaluate and admit patient.  Stephano Arrants was evaluated in Emergency Department on 08/14/2019 for the symptoms described in the history of present illness. He was evaluated in the context of the global COVID-19 pandemic, which necessitated consideration that the patient might be at risk for infection with the SARS-CoV-2 virus that causes COVID-19. Institutional protocols and algorithms that pertain to the evaluation of patients at risk for COVID-19 are in a state of rapid change based on information released by regulatory bodies  including the CDC and federal and state organizations. These policies and algorithms were followed during the patient's care in the ED.   Final Clinical Impression(s) / ED Diagnoses Final diagnoses:  Hypoxia  COVID-19    Rx / DC Orders ED Discharge Orders    None       Lorelee New, PA-C 08/14/19 1420    Gerhard Munch, MD 08/14/19 1652

## 2019-08-14 NOTE — Plan of Care (Signed)
  Problem: Education: Goal: Knowledge of risk factors and measures for prevention of condition will improve Outcome: Progressing   Problem: Coping: Goal: Psychosocial and spiritual needs will be supported Outcome: Progressing   Problem: Respiratory: Goal: Will maintain a patent airway Outcome: Progressing Goal: Complications related to the disease process, condition or treatment will be avoided or minimized Outcome: Progressing   

## 2019-08-14 NOTE — H&P (Addendum)
History and Physical    DOA: 08/14/2019  PCP: Anselmo Pickler, MD  Patient coming from: Home  Chief Complaint: Cough and dyspnea x3 days  HPI: Ricky Bailey is a 64 y.o. male with history h/o hypertension, hyperlipidemia, BPH, gout presents to the ED with complaints of dry cough associated with fevers (T-max 102F at home) and progressive dyspnea over the last 3 days.  Patient states his wife had been sick for 2 weeks and diagnosed with COVID-19 on Monday.  The following day patient started having symptoms which progressively worsened.Patient tested positive for COVID-19 (both POC and PCR) at urgent care in [place] yesterday.He also reports loose stools 3-4 times a day for the last 2 days.  He had one loose BM earlier today. ED course: T-max 99.5, pulse 75, respiratory rate 22, blood pressure 108/67, O2 sat 87% on room air.  Labs show WBC 4.8, hemoglobin 13.5, hematocrit 40, platelets 145.  Sodium 130, potassium 3.2, chloride 95, bicarb 23, BUN 17, creatinine 1.14.  Calcium 8.1.  Glucose 119.  Lactate 1.0.  Ferritin 1393.  CRP 14.8. D-dimer, procalcitonin levels pending.  COVID-19 POC positive.  Blood cultures sent from ED.  Patient has not received any medications except for p.o. KCl 40 mEq in the ED.   Review of Systems: As per HPI otherwise 10 point review of systems negative.    Past Medical History:  Diagnosis Date  . BPH (benign prostatic hyperplasia)   . Gout   . Hyperlipidemia   . Hypertension   . Obesity   . Vertigo 03/28/2018   with vommiting    Past Surgical History:  Procedure Laterality Date  . BACK SURGERY      Social history: Denies any history of smoking.  He has never used smokeless tobacco. He drinks beer twice a week, about 6 cans per week. He reports that he does not use drugs.   No Known Allergies  Family History  Problem Relation Age of Onset  . Heart disease Mother   . Lung cancer Father       Prior to Admission medications   Medication  Sig Start Date End Date Taking? Authorizing Provider  allopurinol (ZYLOPRIM) 300 MG tablet Take 300 mg by mouth daily. 03/07/18   [provider]  amLODipine (NORVASC) 10 MG tablet Take 1 tablet (10 mg total) by mouth daily. 03/31/18   Lahoma Crocker, MD  cetirizine (ZYRTEC) 10 MG tablet Take 10 mg by mouth daily. 03/21/18   [provider]  colchicine 0.6 MG tablet Take 1 tablet (0.6 mg total) by mouth daily. 03/31/18   Lahoma Crocker, MD  finasteride (PROSCAR) 5 MG tablet Take 1 tablet (5 mg total) by mouth daily. 03/31/18   Lahoma Crocker, MD  HYDROcodone-acetaminophen (NORCO/VICODIN) 5-325 MG per tablet Take 1 tablet by mouth every 4 (four) hours as needed. Patient not taking: Reported on 03/28/2018 12/18/14   [name], PA-C  hydrOXYzine (ATARAX/VISTARIL) 10 MG tablet Take 10 mg by mouth 2 (two) times daily. 03/21/18   [provider]  indomethacin (INDOCIN) 50 MG capsule Take 1 capsule (50 mg total) by mouth 3 (three) times daily with meals. Patient not taking: Reported on 03/28/2018 12/18/14   [name], PA-C  lisinopril (PRINIVIL,ZESTRIL) 40 MG tablet Take 1 tablet (40 mg total) by mouth daily. 03/31/18   Lahoma Crocker, MD  LORazepam (ATIVAN) 1 MG tablet Take 1 tablet (1 mg total) by mouth 3 (three) times daily as needed for anxiety. Prn  for dizziness 03/28/18   Donnetta Hutching, MD  meclizine (ANTIVERT) 25 MG tablet Take 1 tablet (25 mg total) by mouth 3 (three) times daily as needed for dizziness. 03/28/18   Donnetta Hutching, MD  meclizine (ANTIVERT) 25 MG tablet Take 1 tablet (25 mg total) by mouth 3 (three) times daily. 03/30/18   Lahoma Crocker, MD  methocarbamol (ROBAXIN) 500 MG tablet Take 1-2 tablets (500-1,000 mg total) by mouth every 6 (six) hours as needed for muscle spasms. Patient not taking: Reported on 03/28/2018 07/22/15   Trixie Dredge, PA-C  traMADol (ULTRAM) 50 MG tablet Take 50 mg by mouth 2 (two) times daily as needed for moderate pain.   03/07/18   [provider]    Physical Exam: Vitals:   08/14/19 1238 08/14/19 1300 08/14/19 1330  BP: 108/67 113/68 113/66  Pulse: 81 76 75  Resp: (!) 22 (!) 22 (!) 22  Temp: 99.5 F (37.5 C)    SpO2: (!) 87% 91% 94%    Constitutional: NAD, calm, comfortable Eyes: PERRL, lids and conjunctivae normal ENMT: Mucous membranes are moist. Posterior pharynx clear of any exudate or lesions.Normal dentition.  Neck: normal, supple, no masses, no thyromegaly Respiratory: clear to auscultation bilaterally, no wheezing, no crackles. Normal respiratory effort. No accessory muscle use.  Cardiovascular: Regular rate and rhythm, no murmurs / rubs / gallops. No extremity edema. 2+ pedal pulses. No carotid bruits.  Abdomen: no tenderness, no masses palpated. No hepatosplenomegaly. Bowel sounds positive.  Musculoskeletal: no clubbing / cyanosis. No joint deformity upper and lower extremities. Good ROM, no contractures. Normal muscle tone.  Neurologic: CN 2-12 grossly intact. Sensation intact, DTR normal. Strength 5/5 in all 4.  Psychiatric: Normal judgment and insight. Alert and oriented x 3. Normal mood.  SKIN/catheters: no rashes, lesions, ulcers. No induration  Labs on Admission: I have personally reviewed following labs and imaging studies  CBC: Recent Labs  Lab 08/14/19 1242  WBC 4.8  NEUTROABS 3.6  HGB 13.5  HCT 40.0  MCV 89.1  PLT 145*   Basic Metabolic Panel: Recent Labs  Lab 08/14/19 1242  NA 130*  K 3.2*  CL 95*  CO2 23  GLUCOSE 119*  BUN 17  CREATININE 1.14  CALCIUM 8.1*   GFR: CrCl cannot be calculated (Unknown ideal weight.). Recent Labs  Lab 08/14/19 1242  WBC 4.8  LATICACIDVEN 1.0   Liver Function Tests: Recent Labs  Lab 08/14/19 1242  AST 55*  ALT 43  ALKPHOS 38  BILITOT 1.2  PROT 7.4  ALBUMIN 3.5   No results for input(s): LIPASE, AMYLASE in the last 168 hours. No results for input(s): AMMONIA in the last 168 hours. Coagulation  Profile: No results for input(s): INR, PROTIME in the last 168 hours. Cardiac Enzymes: No results for input(s): CKTOTAL, CKMB, CKMBINDEX, TROPONINI in the last 168 hours. BNP (last 3 results) No results for input(s): PROBNP in the last 8760 hours. HbA1C: No results for input(s): HGBA1C in the last 72 hours. CBG: No results for input(s): GLUCAP in the last 168 hours. Lipid Profile: Recent Labs    08/14/19 1242  TRIG 55   Thyroid Function Tests: No results for input(s): TSH, T4TOTAL, FREET4, T3FREE, THYROIDAB in the last 72 hours. Anemia Panel: Recent Labs    08/14/19 1242  FERRITIN 1,393*     Radiological Exams on Admission: Personally reviewed  DG Chest Port 1 View  Result Date: 08/14/2019 CLINICAL DATA:  COVID-19 positive yesterday, short of breath, body aches, diarrhea EXAM: PORTABLE  CHEST 1 VIEW COMPARISON:  09/21/2017 FINDINGS: Single frontal view of the chest demonstrates multifocal bilateral interstitial and ground-glass opacities greatest in the right perihilar and left infrahilar regions. Overall, findings are consistent with COVID-19 pneumonia. No effusion or pneumothorax. Cardiac silhouette is unremarkable. IMPRESSION: Findings consistent with COVID-19 pneumonia. Electronically Signed   By: Randa Ngo M.D.   On: 08/14/2019 13:39    EKG: Independently reviewed.  Normal sinus rhythm with QTC 422 ms     Assessment and Plan:   Principal Problem:   Acute respiratory failure due to COVID-19 [facility]) Active Problems:   Pneumonia due to COVID-19 virus   Hyponatremia   BPPV (benign paroxysmal positional vertigo)   Benign essential HTN   Obesity (BMI 30-39.9)   Hypokalemia   Hypercholesteremia   BPH (benign prostatic hyperplasia)    1.  Acute hypoxic respiratory failure secondary to COVID-19 pneumonia: Currently saturating well on 2 L O2.  Admit with IV fluids, IV Decadron and remdesivir.   Since CRP greater than 10, discussed with patient regarding Actemra. 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  Follow-up procalcitonin and D-dimer levels.   2.  Hyponatremia: Likely secondary to dehydration in the setting of diarrhea.  Will give IV fluids, supportive management for nausea/diarrhea.  Patient feels diarrhea improving today.  If symptoms recur or severe, can consider further stool tests.  3.  Hypertension: Hold Norvasc/lisinopril given borderline blood pressures.  Resume if blood pressure improves after IV hydration.  4.  Hypokalemia: Received oral replacement in the ED. Will supplement IV fluids with potassium to avoid further dilutional effect.  Labs in a.m.  5.  Hyperlipidemia: Resume home meds  6.  History of gout: No acute issues.  Resume allopurinol, no longer takes colchicine.  7.  BPH: Resume home meds  8.  BPPV: Antivert as needed  9.  Obesity (BMI 30-39.9): Follow-up PCP  DVT prophylaxis: Lovenox  COVID screen: Positive  Code Status: Full code   .Health care proxy would be his wife Benjamine Mola and daughter  Patient/Family Communication: Discussed with patient and all questions answered to satisfaction.  Consults called: None Admission status :I certify that at the point of admission it is my clinical judgment that the patient will require inpatient hospital care spanning beyond 2 midnights from the point of admission due to high intensity of service and high frequency of surveillance required.Inpatient status is judged to be reasonable and necessary in order to provide the required intensity of service to ensure the patient's safety. The patient's presenting symptoms, physical exam findings, and initial radiographic and laboratory data in the context of their chronic comorbidities is felt to place them at high risk for further clinical deterioration. The following factors support the patient status of inpatient : COVID-19 pneumonia with acute hypoxic respiratory failure requiring IV steroids, IV  remdesivir, supplemental O2 and supportive management with IV fluids for hyponatremia/dehydration/borderline low blood pressures.     Guilford Shi MD [facility] Pager in [place]  If 7PM-7AM, please contact night-coverage [email or web address] Password [facility]  08/14/2019, 2:32 PM

## 2019-08-14 NOTE — ED Triage Notes (Signed)
Pt reports was tested positive for Coivd yesterday at [facility] in [place]. C/o SHOB and body aches, diarrhea

## 2019-08-15 DIAGNOSIS — J1282 Pneumonia due to coronavirus disease 2019: Secondary | ICD-10-CM

## 2019-08-15 LAB — COMPREHENSIVE METABOLIC PANEL
ALT: 39 U/L (ref 0–44)
AST: 49 U/L — ABNORMAL HIGH (ref 15–41)
Albumin: 3.1 g/dL — ABNORMAL LOW (ref 3.5–5.0)
Alkaline Phosphatase: 36 U/L — ABNORMAL LOW (ref 38–126)
Anion gap: 11 (ref 5–15)
BUN: 19 mg/dL (ref 8–23)
CO2: 23 mmol/L (ref 22–32)
Calcium: 8.2 mg/dL — ABNORMAL LOW (ref 8.9–10.3)
Chloride: 101 mmol/L (ref 98–111)
Creatinine, Ser: 1.06 mg/dL (ref 0.61–1.24)
GFR calc Af Amer: >60 mL/min (ref >60)
GFR calc non Af Amer: >60 mL/min (ref >60)
Glucose, Bld: 128 mg/dL — ABNORMAL HIGH (ref 70–99)
Potassium: 3.9 mmol/L (ref 3.5–5.1)
Sodium: 135 mmol/L (ref 135–145)
Total Bilirubin: 1.1 mg/dL (ref 0.3–1.2)
Total Protein: 7.2 g/dL (ref 6.5–8.1)

## 2019-08-15 LAB — MAGNESIUM: Magnesium: 1.9 mg/dL (ref 1.7–2.4)

## 2019-08-15 LAB — ABO/RH: ABO/RH(D): AB POS

## 2019-08-15 LAB — CBC WITH DIFFERENTIAL/PLATELET
Abs Immature Granulocytes: 0.02 10*3/uL (ref 0.00–0.07)
Basophils Absolute: 0 10*3/uL (ref 0.0–0.1)
Basophils Relative: 0 %
Eosinophils Absolute: 0 10*3/uL (ref 0.0–0.5)
Eosinophils Relative: 0 %
HCT: 39.4 % (ref 39.0–52.0)
Hemoglobin: 13.3 g/dL (ref 13.0–17.0)
Immature Granulocytes: 1 %
Lymphocytes Relative: 13 %
Lymphs Abs: 0.5 10*3/uL — ABNORMAL LOW (ref 0.7–4.0)
MCH: 29.8 pg (ref 26.0–34.0)
MCHC: 33.8 g/dL (ref 30.0–36.0)
MCV: 88.3 fL (ref 80.0–100.0)
Monocytes Absolute: 0.1 10*3/uL (ref 0.1–1.0)
Monocytes Relative: 3 %
Neutro Abs: 3.3 10*3/uL (ref 1.7–7.7)
Neutrophils Relative %: 83 %
Platelets: 162 10*3/uL (ref 150–400)
RBC: 4.46 MIL/uL (ref 4.22–5.81)
RDW: 12.6 % (ref 11.5–15.5)
WBC: 3.9 10*3/uL — ABNORMAL LOW (ref 4.0–10.5)
nRBC: 0 % (ref 0.0–0.2)

## 2019-08-15 LAB — C-REACTIVE PROTEIN: CRP: 15.2 mg/dL — ABNORMAL HIGH (ref <1.0)

## 2019-08-15 LAB — FERRITIN: Ferritin: 1386 ng/mL — ABNORMAL HIGH (ref 24–336)

## 2019-08-15 LAB — D-DIMER, QUANTITATIVE: D-Dimer, Quant: 1.14 ug/mL-FEU — ABNORMAL HIGH (ref 0.00–0.50)

## 2019-08-15 LAB — HIV ANTIBODY (ROUTINE TESTING W REFLEX): HIV Screen 4th Generation wRfx: NONREACTIVE

## 2019-08-15 MED ORDER — SODIUM CHLORIDE 0.9% IV SOLUTION: Freq: Once | INTRAVENOUS | Status: AC

## 2019-08-15 MED ORDER — ZINC SULFATE 220 (50 ZN) MG PO CAPS
220.0000 mg | ORAL_CAPSULE | Freq: Every day | ORAL | Status: DC
  Administered 2019-08-15 – 2019-08-19 (×5): 220 mg via ORAL
  Filled 2019-08-15 (×5): qty 1

## 2019-08-15 MED ORDER — ENOXAPARIN SODIUM 60 MG/0.6ML ~~LOC~~ SOLN
0.5000 mg/kg | SUBCUTANEOUS | Status: DC
  Administered 2019-08-15 – 2019-08-18 (×4): 60 mg via SUBCUTANEOUS
  Filled 2019-08-15 (×5): qty 0.6

## 2019-08-15 MED ORDER — FAMOTIDINE 20 MG PO TABS
20.0000 mg | ORAL_TABLET | Freq: Every day | ORAL | Status: DC
  Administered 2019-08-15 – 2019-08-19 (×5): 20 mg via ORAL
  Filled 2019-08-15 (×5): qty 1

## 2019-08-15 MED ORDER — DEXAMETHASONE SODIUM PHOSPHATE 10 MG/ML IJ SOLN
6.0000 mg | Freq: Two times a day (BID) | INTRAMUSCULAR | Status: DC
  Administered 2019-08-15 – 2019-08-18 (×7): 6 mg via INTRAVENOUS
  Filled 2019-08-15 (×7): qty 1

## 2019-08-15 MED ORDER — ASCORBIC ACID 500 MG PO TABS
500.0000 mg | ORAL_TABLET | Freq: Every day | ORAL | Status: DC
  Administered 2019-08-15 – 2019-08-19 (×5): 500 mg via ORAL
  Filled 2019-08-15 (×5): qty 1

## 2019-08-15 NOTE — Progress Notes (Signed)
Ambulation Note  Saturation Pre: 92% on 3L  Ambulation Distance: 500 ft  Saturation During Ambulation: 83-94% on 4 L  Notes: Pt walked independently. Tolerated well. Pt returned to recliner with call bell within reach, SpO2 88% on 4L.  Alisia Ferrari, MS, ACSM CEP 10:19 AM 08/15/2019

## 2019-08-15 NOTE — Progress Notes (Signed)
PROGRESS NOTE  Ricky Bailey PFX:[id number] DOB: 1956-02-10 DOA: 08/14/2019  PCP: Christ Kick, MD  Brief History/Interval Summary: 64 y.o. male with history h/o hypertension, hyperlipidemia, BPH, gout presented to the ED with complaints of dry cough associated with fevers (T-max 102F at home) and progressive dyspnea over 3 days.  Patient states his wife had been sick for 2 weeks and diagnosed with COVID-19 on 08/11/19.  The following day patient started having symptoms which progressively worsened. Patient tested positive for COVID-19 (both POC and PCR) at urgent care in [place].  He was also experiencing loose stools.  Patient was noted to be hypoxic.  He was hospitalized for further management.     Reason for Visit: Pneumonia due to COVID-19.  Acute respiratory failure with hypoxia  Consultants: None  Procedures: None  Antibiotics: Anti-infectives (From admission, onward)   Start     Dose/Rate Route Frequency Ordered Stop   08/15/19 1000  remdesivir 100 mg in sodium chloride 0.9 % 100 mL IVPB     100 mg 200 mL/hr over 30 Minutes Intravenous Daily 08/14/19 1441 08/19/19 0959   08/14/19 1500  remdesivir 200 mg in sodium chloride 0.9% 250 mL IVPB     200 mg 580 mL/hr over 30 Minutes Intravenous Once 08/14/19 1441 08/14/19 1617      Subjective/Interval History: Patient states that he is having difficulty breathing.  Occasional cough which is dry.  Denies any nausea or vomiting.  Diarrhea seems to be slowing down.  No chest pain.    Assessment/Plan:  Acute Hypoxic Resp. Failure/Pneumonia due to COVID-19   Recent Labs  Lab 08/14/19 1242 08/15/19 0044  DDIMER 1.26* 1.14*  FERRITIN 1,393* 1,386*  CRP 14.8* 15.2*  ALT 43 39  PROCALCITON <0.10  --     Objective findings: Fever: Low-grade fever noted overnight Oxygen requirements: Currently on 4 L of oxygen by nasal cannula.  Saturating in the early 90s.  COVID 19 Therapeutics: Antibacterials: None Remdesivir:  Day 2 Steroids: Dexamethasone 6 mg every 12 hours Diuretics: None Actemra: Discussed with patient.  Will consider if he worsens. Convalescent Plasma: Will be ordered today Vitamin C and Zinc: Will initiate PUD Prophylaxis: Initiate Pepcid DVT Prophylaxis:  Lovenox   Patient remained stable from a respiratory standpoint.  He is on 4 L of oxygen saturating in the early 90s.  Continue remdesivir and steroids.  Inflammatory markers noted to be elevated.  Continue incentive spirometry mobilization, out of bed to chair.  Prone positioning also discussed with the patient.  Convalescent plasma was offered.  Experimental nature of this treatment was discussed with the patient.  He was given consent forms.  He is reviewed.  His questions were answered.  He has signed the forms.  Plasma will be ordered.  Hyponatremia Resolved.  Hypokalemia Repleted.  Leukopenia Due to COVID-19.  History of gout Continue allopurinol  History of BPH Continue finasteride.  History of BPPV Antivert as needed.  Obesity Estimated body mass index is 35.61 kg/m as calculated from the following:   Height as of this encounter: 5\' 11"  (1.803 m).   Weight as of this encounter: 115.8 kg.    DVT Prophylaxis: Lovenox Code Status: Full code Family Communication: We will update daughter Disposition Plan: Hopefully return home when improved.   Medications:  Scheduled: . albuterol  1 puff Inhalation QID  . allopurinol  300 mg Oral Daily  . dexamethasone (DECADRON) injection  6 mg Intravenous Q12H  . enoxaparin (LOVENOX) injection  40 mg Subcutaneous  Q24H  . finasteride  5 mg Oral Daily  . rosuvastatin  20 mg Oral Daily   Continuous: . remdesivir 100 mg in NS 100 mL 100 mg (08/15/19 1022)  . 0.9 % sodium chloride with kcl 75 mL/hr at 08/14/19 1759   ZOX:[id number], albuterol, hydrOXYzine, loperamide, meclizine, traMADol   Objective:  Vital Signs  Vitals:   08/14/19 2356 08/15/19 0000 08/15/19  0400 08/15/19 0730  BP: 130/83  127/85 139/89  Pulse: 78  74   Resp: (!) 33 20 20 (!) 24  Temp: 98.6 F (37 C)  98.4 F (36.9 C) 98.7 F (37.1 C)  TempSrc: Oral  Oral Oral  SpO2: 94%  92%   Weight:      Height:        Intake/Output Summary (Last 24 hours) at 08/15/2019 1026 Last data filed at 08/15/2019 0745 Gross per 24 hour  Intake 325 ml  Output 300 ml  Net 25 ml   Filed Weights   08/14/19 1720  Weight: 115.8 kg    General appearance: Awake alert.  In no distress Resp: Tachypneic at rest.  Coarse breath sounds with crackles at the bases.  No wheezing or rhonchi. Cardio: S1-S2 is normal regular.  No S3-S4.  No rubs murmurs or bruit GI: Abdomen is soft.  Nontender nondistended.  Bowel sounds are present normal.  No masses organomegaly Extremities: No edema.  Full range of motion of lower extremities. Neurologic: Alert and oriented x3.  No focal neurological deficits.    Lab Results:  Data Reviewed: I have personally reviewed following labs and imaging studies  CBC: Recent Labs  Lab 08/14/19 1242 08/15/19 0044  WBC 4.8 3.9*  NEUTROABS 3.6 3.3  HGB 13.5 13.3  HCT 40.0 39.4  MCV 89.1 88.3  PLT 145* 162    Basic Metabolic Panel: Recent Labs  Lab 08/14/19 1242 08/15/19 0044  NA 130* 135  K 3.2* 3.9  CL 95* 101  CO2 23 23  GLUCOSE 119* 128*  BUN 17 19  CREATININE 1.14 1.06  CALCIUM 8.1* 8.2*  MG  --  1.9    GFR: Estimated Creatinine Clearance: 92.3 mL/min (by C-G formula based on SCr of 1.06 mg/dL).  Liver Function Tests: Recent Labs  Lab 08/14/19 1242 08/15/19 0044  AST 55* 49*  ALT 43 39  ALKPHOS 38 36*  BILITOT 1.2 1.1  PROT 7.4 7.2  ALBUMIN 3.5 3.1*     Lipid Profile: Recent Labs    08/14/19 1242  TRIG 55    Anemia Panel: Recent Labs    08/14/19 1242 08/15/19 0044  FERRITIN 1,393* 1,386*      Radiology Studies: DG Chest Port 1 View  Result Date: 08/14/2019 CLINICAL DATA:  COVID-19 positive yesterday, short of breath,  body aches, diarrhea EXAM: PORTABLE CHEST 1 VIEW COMPARISON:  09/21/2017 FINDINGS: Single frontal view of the chest demonstrates multifocal bilateral interstitial and ground-glass opacities greatest in the right perihilar and left infrahilar regions. Overall, findings are consistent with COVID-19 pneumonia. No effusion or pneumothorax. Cardiac silhouette is unremarkable. IMPRESSION: Findings consistent with COVID-19 pneumonia. Electronically Signed   By: Sharlet Salina M.D.   On: 08/14/2019 13:39       LOS: 1 day   Deryck Hippler  [facility] Pager on [email or web address]  08/15/2019, 10:26 AM  '

## 2019-08-15 NOTE — Plan of Care (Signed)
  Problem: Education: Goal: Knowledge of risk factors and measures for prevention of condition will improve Outcome: Progressing   Problem: Coping: Goal: Psychosocial and spiritual needs will be supported Outcome: Progressing   Problem: Respiratory: Goal: Will maintain a patent airway Outcome: Progressing Goal: Complications related to the disease process, condition or treatment will be avoided or minimized Outcome: Progressing   

## 2019-08-16 LAB — CBC WITH DIFFERENTIAL/PLATELET
Abs Immature Granulocytes: 0.05 10*3/uL (ref 0.00–0.07)
Basophils Absolute: 0 10*3/uL (ref 0.0–0.1)
Basophils Relative: 0 %
Eosinophils Absolute: 0 10*3/uL (ref 0.0–0.5)
Eosinophils Relative: 0 %
HCT: 42.6 % (ref 39.0–52.0)
Hemoglobin: 14.2 g/dL (ref 13.0–17.0)
Immature Granulocytes: 1 %
Lymphocytes Relative: 8 %
Lymphs Abs: 0.6 10*3/uL — ABNORMAL LOW (ref 0.7–4.0)
MCH: 29.6 pg (ref 26.0–34.0)
MCHC: 33.3 g/dL (ref 30.0–36.0)
MCV: 88.8 fL (ref 80.0–100.0)
Monocytes Absolute: 0.3 10*3/uL (ref 0.1–1.0)
Monocytes Relative: 5 %
Neutro Abs: 6.3 10*3/uL (ref 1.7–7.7)
Neutrophils Relative %: 86 %
Platelets: 213 10*3/uL (ref 150–400)
RBC: 4.8 MIL/uL (ref 4.22–5.81)
RDW: 12.8 % (ref 11.5–15.5)
WBC: 7.3 10*3/uL (ref 4.0–10.5)
nRBC: 0 % (ref 0.0–0.2)

## 2019-08-16 LAB — COMPREHENSIVE METABOLIC PANEL
ALT: 38 U/L (ref 0–44)
AST: 44 U/L — ABNORMAL HIGH (ref 15–41)
Albumin: 3.3 g/dL — ABNORMAL LOW (ref 3.5–5.0)
Alkaline Phosphatase: 41 U/L (ref 38–126)
Anion gap: 17 — ABNORMAL HIGH (ref 5–15)
BUN: 27 mg/dL — ABNORMAL HIGH (ref 8–23)
CO2: 19 mmol/L — ABNORMAL LOW (ref 22–32)
Calcium: 8.2 mg/dL — ABNORMAL LOW (ref 8.9–10.3)
Chloride: 101 mmol/L (ref 98–111)
Creatinine, Ser: 0.84 mg/dL (ref 0.61–1.24)
GFR calc Af Amer: >60 mL/min (ref >60)
GFR calc non Af Amer: >60 mL/min (ref >60)
Glucose, Bld: 153 mg/dL — ABNORMAL HIGH (ref 70–99)
Potassium: 4.1 mmol/L (ref 3.5–5.1)
Sodium: 137 mmol/L (ref 135–145)
Total Bilirubin: 0.8 mg/dL (ref 0.3–1.2)
Total Protein: 7.3 g/dL (ref 6.5–8.1)

## 2019-08-16 LAB — BPAM FFP
Blood Product Expiration Date: 202102061454
ISSUE DATE / TIME: 202102051459
Unit Type and Rh: 8400

## 2019-08-16 LAB — PREPARE FRESH FROZEN PLASMA

## 2019-08-16 LAB — MAGNESIUM: Magnesium: 2.1 mg/dL (ref 1.7–2.4)

## 2019-08-16 LAB — FERRITIN: Ferritin: 1408 ng/mL — ABNORMAL HIGH (ref 24–336)

## 2019-08-16 LAB — C-REACTIVE PROTEIN: CRP: 7.7 mg/dL — ABNORMAL HIGH (ref <1.0)

## 2019-08-16 LAB — D-DIMER, QUANTITATIVE: D-Dimer, Quant: 1.06 ug/mL-FEU — ABNORMAL HIGH (ref 0.00–0.50)

## 2019-08-16 MED ORDER — GUAIFENESIN-DM 100-10 MG/5ML PO SYRP
15.0000 mL | ORAL_SOLUTION | ORAL | Status: DC | PRN
  Administered 2019-08-16 – 2019-08-17 (×3): 15 mL via ORAL
  Filled 2019-08-16 (×3): qty 20

## 2019-08-16 NOTE — Progress Notes (Signed)
PROGRESS NOTE  Ricky Bailey [id number]:[id number] DOB: [birth year] DOA: 08/14/2019  PCP: Christ Kick, MD  Brief History/Interval Summary: 64 y.o. male with history h/o hypertension, hyperlipidemia, BPH, gout presented to the ED with complaints of dry cough associated with fevers (T-max 102F at home) and progressive dyspnea over 3 days.  Patient states his wife had been sick for 2 weeks and diagnosed with COVID-19 on 08/11/19.  The following day patient started having symptoms which progressively worsened. Patient tested positive for COVID-19 (both POC and PCR) at urgent care in [place].  He was also experiencing loose stools.  Patient was noted to be hypoxic.  He was hospitalized for further management.     Reason for Visit: Pneumonia due to COVID-19.  Acute respiratory failure with hypoxia  Consultants: None  Procedures: None  Antibiotics: Anti-infectives (From admission, onward)   Start     Dose/Rate Route Frequency Ordered Stop   08/15/19 1000  remdesivir 100 mg in sodium chloride 0.9 % 100 mL IVPB     100 mg 200 mL/hr over 30 Minutes Intravenous Daily 08/14/19 1441 08/19/19 0959   08/14/19 1500  remdesivir 200 mg in sodium chloride 0.9% 250 mL IVPB     200 mg 580 mL/hr over 30 Minutes Intravenous Once 08/14/19 1441 08/14/19 1617      Subjective/Interval History: Patient mentions that he is feeling slightly better today.  Still gets short of breath with minimal exertion.  Denies any nausea vomiting.  Still has a cough which is dry for the most part.  Denies any blood in the sputum.    Assessment/Plan:  Acute Hypoxic Resp. Failure/Pneumonia due to COVID-19   Recent Labs  Lab 08/14/19 1242 08/15/19 0044 08/16/19 0214  DDIMER 1.26* 1.14* 1.06*  FERRITIN 1,393* 1,386* 1,408*  CRP 14.8* 15.2* 7.7*  ALT 43 39 71  PROCALCITON <0.10  --   --     Objective findings: Fever: Afebrile Oxygen requirements: 3 L nasal cannula saturating in the early 90s.    COVID 19  Therapeutics: Antibacterials: None Remdesivir: Day 3 Steroids: Dexamethasone 6 mg every 12 hours Diuretics: None Actemra: Discussed with patient.  Will consider if he worsens. Convalescent Plasma: Transfused on 2/5 Vitamin C and Zinc: Continue PUD Prophylaxis: Pepcid DVT Prophylaxis:  Lovenox   From a respiratory standpoint patient is stable.  May be slightly better compared to yesterday.  Still requiring about 3 L of oxygen.  Wean down as tolerated.  Inflammatory markers have improved.  Continue with incentive spirometry mobilization and out of bed to chair.  Prone positioning as possible.  Continue remdesivir and steroids.  Patient was transfused convalescent plasma yesterday.  Hyponatremia Resolved.  Hypokalemia Repleted.  Leukopenia Due to COVID-19.  History of gout Continue allopurinol  History of BPH Continue finasteride.  History of BPPV Antivert as needed.  Obesity Estimated body mass index is 35.61 kg/m as calculated from the following:   Height as of this encounter: 5\' 11"  (1.803 m).   Weight as of this encounter: 115.8 kg.    DVT Prophylaxis: Lovenox Code Status: Full code Family Communication: Daughter being updated daily. Disposition Plan: Hopefully return home when improved.  PT evaluation.   Medications:  Scheduled: . albuterol  1 puff Inhalation QID  . allopurinol  300 mg Oral Daily  . vitamin C  500 mg Oral Daily  . dexamethasone (DECADRON) injection  6 mg Intravenous Q12H  . enoxaparin (LOVENOX) injection  0.5 mg/kg Subcutaneous Q24H  . famotidine  20 mg Oral  Daily  . finasteride  5 mg Oral Daily  . rosuvastatin  20 mg Oral Daily  . zinc sulfate  220 mg Oral Daily   Continuous: . remdesivir 100 mg in NS 100 mL Stopped (08/16/19 0900)  . 0.9 % sodium chloride with kcl 75 mL/hr at 08/16/19 0701   GUR:[id number], albuterol, guaiFENesin-dextromethorphan, hydrOXYzine, loperamide, meclizine, traMADol   Objective:  Vital Signs  Vitals:    08/15/19 1931 08/16/19 0335 08/16/19 0749 08/16/19 0810  BP:  137/87  127/79  Pulse:  73  72  Resp: 20 20  (!) 22  Temp:  97.9 F (36.6 C)  97.8 F (36.6 C)  TempSrc:  Oral  Oral  SpO2:  92% 92% 92%  Weight:      Height:        Intake/Output Summary (Last 24 hours) at 08/16/2019 1106 Last data filed at 08/16/2019 0900 Gross per 24 hour  Intake 2306 ml  Output 1350 ml  Net 956 ml   Filed Weights   08/14/19 1720  Weight: 115.8 kg    General appearance: Awake alert.  In no distress Resp: Tachypneic at rest.  Coarse breath sounds with crackles bilateral bases.  No wheezing or rhonchi. Cardio: S1-S2 is normal regular.  No S3-S4.  No rubs murmurs or bruit GI: Abdomen is soft.  Nontender nondistended.  Bowel sounds are present normal.  No masses organomegaly Extremities: No edema.  Full range of motion of lower extremities. Neurologic: Alert and oriented x3.  No focal neurological deficits.     Lab Results:  Data Reviewed: I have personally reviewed following labs and imaging studies  CBC: Recent Labs  Lab 08/14/19 1242 08/15/19 0044 08/16/19 0214  WBC 4.8 3.9* 7.3  NEUTROABS 3.6 3.3 6.3  HGB 13.5 13.3 14.2  HCT 40.0 39.4 42.6  MCV 89.1 88.3 88.8  PLT 145* 162 213    Basic Metabolic Panel: Recent Labs  Lab 08/14/19 1242 08/15/19 0044 08/16/19 0214  NA 130* 135 137  K 3.2* 3.9 4.1  CL 95* 101 101  CO2 23 23 19*  GLUCOSE 119* 128* 153*  BUN 17 19 27*  CREATININE 1.14 1.06 0.84  CALCIUM 8.1* 8.2* 8.2*  MG  --  1.9 2.1    GFR: Estimated Creatinine Clearance: 116.5 mL/min (by C-G formula based on SCr of 0.84 mg/dL).  Liver Function Tests: Recent Labs  Lab 08/14/19 1242 08/15/19 0044 08/16/19 0214  AST 55* 49* 44*  ALT 43 39 38  ALKPHOS 38 36* 41  BILITOT 1.2 1.1 0.8  PROT 7.4 7.2 7.3  ALBUMIN 3.5 3.1* 3.3*     Lipid Profile: Recent Labs    08/14/19 1242  TRIG 55    Anemia Panel: Recent Labs    08/15/19 0044 08/16/19 0214  FERRITIN  1,386* 1,408*      Radiology Studies: DG Chest Port 1 View  Result Date: 08/14/2019 CLINICAL DATA:  COVID-19 positive yesterday, short of breath, body aches, diarrhea EXAM: PORTABLE CHEST 1 VIEW COMPARISON:  09/21/2017 FINDINGS: Single frontal view of the chest demonstrates multifocal bilateral interstitial and ground-glass opacities greatest in the right perihilar and left infrahilar regions. Overall, findings are consistent with COVID-19 pneumonia. No effusion or pneumothorax. Cardiac silhouette is unremarkable. IMPRESSION: Findings consistent with COVID-19 pneumonia. Electronically Signed   By: Sharlet Salina M.D.   On: 08/14/2019 13:39       LOS: 2 days   Brendalee Matthies Rito Ehrlich  [facility] Pager on [email or web address]  08/16/2019, 11:06 AM  '

## 2019-08-16 NOTE — Evaluation (Signed)
Physical Therapy Evaluation Patient Details Name: Ricky Bailey MRN: [id number] DOB: 1956/01/08 Today's Date: 08/16/2019   History of Present Illness  64 y.o. male with history h/o hypertension, hyperlipidemia, BPH, gout presented to the ED with complaints of dry cough associated with fevers (T-max 102F at home) and progressive dyspnea over 3 days.  Patient states his wife had been sick for 2 weeks and diagnosed with COVID-19 on 08/11/19.  The following day patient started having symptoms which progressively worsened. Patient tested positive for COVID-19 (both POC and PCR) at urgent care in [place].  He was also experiencing loose stools.  Patient was noted to be hypoxic.  He was hospitalized for further management.    Clinical Impression   Pt admitted with above diagnosis. 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 Pt currently with functional limitations due to the deficits listed below (see PT Problem List). This pm pt is fatigued but willing to attempt mobility, he was hiccupping a lot at start of session but towards end they seemed to disappear. Pt was on 3L/min vai [place] and was sating in 90s at rest prior to ambulation, with supine to sit desat to mid 80s but with cues for pursed lip breathing was able to recover to low 90s again. With ambulation pt was able to ambulate 522ft with SBA but on 3L/min dsesat to min 84%, attempted to increase 02 to 4L but stas remained in mid 80s throughout distance. Once back in room and semi reclined was able to completed pursed lip breathing and increase sats to high 80s. Attempted to titrate back to 3L/min but unable to maintain sats in 90s without 4L/min at this time. Pt will benefit from skilled PT to increase their independence and safety with mobility to allow discharge to the venue listed below.       Follow Up Recommendations No PT follow up    Equipment  Recommendations  None recommended by PT    Recommendations for Other Services       Precautions / Restrictions Precautions Precautions: None Restrictions Weight Bearing Restrictions: No      Mobility  Bed Mobility Overal bed mobility: Modified Independent                Transfers Overall transfer level: Needs assistance   Transfers: Sit to/from Stand;Stand Pivot Transfers Sit to Stand: Supervision Stand pivot transfers: Supervision          Ambulation/Gait Ambulation/Gait assistance: Supervision Gait Distance (Feet): 500 Feet Assistive device: None Gait Pattern/deviations: Step-through pattern;Wide base of support Gait velocity: decreased   General Gait Details: ambulated on 3L/min via  [place] desat to min of 84% increased to 4L/min but sats remained in low 80s during ambulation  Stairs            Wheelchair Mobility    Modified Rankin (Stroke Patients Only)       Balance Overall balance assessment: Mild deficits observed, not formally tested                                           Pertinent Vitals/Pain Pain Assessment: No/denies pain    Home Living Family/patient expects to be discharged to:: Private residence Living Arrangements: Spouse/significant other;Children Available Help at Discharge: Family Type of Home: House Home Access: Ramped entrance  Home Layout: Two level;Able to live on main level with bedroom/bathroom Home Equipment: Gilford Rile - 2 wheels;Cane - single point;Grab bars - tub/shower;Grab bars - toilet      Prior Function Level of Independence: Independent               Hand Dominance   Dominant Hand: Right    Extremity/Trunk Assessment   Upper Extremity Assessment Upper Extremity Assessment: Overall WFL for tasks assessed    Lower Extremity Assessment Lower Extremity Assessment: Overall WFL for tasks assessed    Cervical / Trunk Assessment Cervical / Trunk Assessment: Kyphotic   Communication   Communication: No difficulties  Cognition Arousal/Alertness: Lethargic Behavior During Therapy: WFL for tasks assessed/performed Overall Cognitive Status: No family/caregiver present to determine baseline cognitive functioning                                        General Comments      Exercises     Assessment/Plan    PT Assessment Patient needs continued PT services  PT Problem List Decreased strength;Decreased activity tolerance;Decreased mobility;Decreased safety awareness       PT Treatment Interventions Gait training;DME instruction;Functional mobility training;Therapeutic activities;Therapeutic exercise;Balance training;Neuromuscular re-education;Patient/family education    PT Goals (Current goals can be found in the Care Plan section)  Acute Rehab PT Goals Patient Stated Goal: to get home PT Goal Formulation: With patient Time For Goal Achievement: 08/30/19 Potential to Achieve Goals: Good    Frequency Min 3X/week   Barriers to discharge        Co-evaluation               AM-PAC PT "6 Clicks" Mobility  Outcome Measure Help needed turning from your back to your side while in a flat bed without using bedrails?: None Help needed moving from lying on your back to sitting on the side of a flat bed without using bedrails?: None Help needed moving to and from a bed to a chair (including a wheelchair)?: A Little Help needed standing up from a chair using your arms (e.g., wheelchair or bedside chair)?: A Little Help needed to walk in hospital room?: A Little Help needed climbing 3-5 steps with a railing? : A Little 6 Click Score: 20    End of Session Equipment Utilized During Treatment: Oxygen Activity Tolerance: Patient tolerated treatment well;Patient limited by fatigue;Patient limited by lethargy;Treatment limited secondary to medical complications (Comment) Patient left: in bed;with call bell/phone within reach Nurse  Communication: Mobility status PT Visit Diagnosis: Other abnormalities of gait and mobility (R26.89)    Time: 3500-9381 PT Time Calculation (min) (ACUTE ONLY): 24 min   Charges:   PT Evaluation $PT Eval Moderate Complexity: 1 Mod PT Treatments $Gait Training: 8-22 mins        Horald Chestnut, PT   Delford Field 08/16/2019, 3:39 PM

## 2019-08-17 LAB — COMPREHENSIVE METABOLIC PANEL
ALT: 40 U/L (ref 0–44)
AST: 39 U/L (ref 15–41)
Albumin: 3 g/dL — ABNORMAL LOW (ref 3.5–5.0)
Alkaline Phosphatase: 42 U/L (ref 38–126)
Anion gap: 10 (ref 5–15)
BUN: 25 mg/dL — ABNORMAL HIGH (ref 8–23)
CO2: 22 mmol/L (ref 22–32)
Calcium: 8.4 mg/dL — ABNORMAL LOW (ref 8.9–10.3)
Chloride: 108 mmol/L (ref 98–111)
Creatinine, Ser: 0.81 mg/dL (ref 0.61–1.24)
GFR calc Af Amer: >60 mL/min (ref >60)
GFR calc non Af Amer: >60 mL/min (ref >60)
Glucose, Bld: 117 mg/dL — ABNORMAL HIGH (ref 70–99)
Potassium: 4.3 mmol/L (ref 3.5–5.1)
Sodium: 140 mmol/L (ref 135–145)
Total Bilirubin: 0.9 mg/dL (ref 0.3–1.2)
Total Protein: 6.9 g/dL (ref 6.5–8.1)

## 2019-08-17 LAB — CBC WITH DIFFERENTIAL/PLATELET
Abs Immature Granulocytes: 0.1 10*3/uL — ABNORMAL HIGH (ref 0.00–0.07)
Basophils Absolute: 0 10*3/uL (ref 0.0–0.1)
Basophils Relative: 0 %
Eosinophils Absolute: 0 10*3/uL (ref 0.0–0.5)
Eosinophils Relative: 0 %
HCT: 41.7 % (ref 39.0–52.0)
Hemoglobin: 14 g/dL (ref 13.0–17.0)
Immature Granulocytes: 1 %
Lymphocytes Relative: 9 %
Lymphs Abs: 0.7 10*3/uL (ref 0.7–4.0)
MCH: 30.2 pg (ref 26.0–34.0)
MCHC: 33.6 g/dL (ref 30.0–36.0)
MCV: 89.9 fL (ref 80.0–100.0)
Monocytes Absolute: 0.6 10*3/uL (ref 0.1–1.0)
Monocytes Relative: 7 %
Neutro Abs: 7.3 10*3/uL (ref 1.7–7.7)
Neutrophils Relative %: 83 %
Platelets: 236 10*3/uL (ref 150–400)
RBC: 4.64 MIL/uL (ref 4.22–5.81)
RDW: 13 % (ref 11.5–15.5)
WBC: 8.7 10*3/uL (ref 4.0–10.5)
nRBC: 0 % (ref 0.0–0.2)

## 2019-08-17 LAB — D-DIMER, QUANTITATIVE: D-Dimer, Quant: 0.77 ug/mL-FEU — ABNORMAL HIGH (ref 0.00–0.50)

## 2019-08-17 LAB — FERRITIN: Ferritin: 1127 ng/mL — ABNORMAL HIGH (ref 24–336)

## 2019-08-17 LAB — C-REACTIVE PROTEIN: CRP: 3.4 mg/dL — ABNORMAL HIGH (ref <1.0)

## 2019-08-17 LAB — MAGNESIUM: Magnesium: 2.3 mg/dL (ref 1.7–2.4)

## 2019-08-17 NOTE — Progress Notes (Signed)
Occupational Therapy Evaluation Patient Details Name: Ricky Bailey MRN: [id number] DOB: 17-Oct-1955 Today's Date: 08/17/2019    History of Present Illness 64 y.o. male with history h/o hypertension, hyperlipidemia, BPH, gout presented to the ED with complaints of dry cough associated with fevers (T-max 102F at home) and progressive dyspnea over 3 days.  Patient states his wife had been sick for 2 weeks and diagnosed with COVID-19 on 08/11/19.  The following day patient started having symptoms which progressively worsened. Patient tested positive for COVID-19 (both POC and PCR) at urgent care in [place].  He was also experiencing loose stools.  Patient was noted to be hypoxic.  He was hospitalized for further management.     Clinical Impression   PTA pt lived with his wife and son, independent in ADLs and mobility. Pt uses a cane or walker occasionally and reports 0 falls in the last 6 months. 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 Pt does not use oxygen at home and is currently on 2L [place]. Pt currently independent to min guard for self-care and mobility tasks. Pt able to ambulate to/from bathroom without an assistive device. Noted 0 instances of loss of balance, however pt slightly unsteady on feet. Pt completed toileting task, able to stand ~4 min at the sink to complete grooming tasks. Pt tolerated standing and ambulating additional 3 min with 0 instances of loss of balance. SpO2 dropped to low 80s on room air with pt requiring 2-4 min seated rest break to return to 90%. Pt reported moderate shortness of breath throughout. Pt titrated back up to 2L [place] at end of session. Pt demonstrates decreased strength, endurance, balance, standing tolerance, and activity tolerance impacting ability to complete self-care and functional transfer tasks. Recommend skilled OT services to address above deficits in order to promote function and prevent further decline.     Follow Up Recommendations  No OT follow  up;Supervision - Intermittent    Equipment Recommendations  3 in 1 bedside commode(for use in shower)    Recommendations for Other Services       Precautions / Restrictions Precautions Precautions: Other (comment) Precaution Comments: Monitor SpO2 Restrictions Weight Bearing Restrictions: No      Mobility Bed Mobility               General bed mobility comments: Pt seated in bedside chair upon OT arrival.  Transfers Overall transfer level: Needs assistance Equipment used: None Transfers: Sit to/from Stand;Stand Pivot Transfers Sit to Stand: Supervision Stand pivot transfers: Supervision            Balance Overall balance assessment: Mild deficits observed, not formally tested                                         ADL either performed or assessed with clinical judgement   ADL Overall ADL's : Needs assistance/impaired Eating/Feeding: Independent;Sitting   Grooming: Supervision/safety;Set up;Standing   Upper Body Bathing: Supervision/ safety;Sitting;Set up   Lower Body Bathing: Supervison/ safety;Min guard;Sit to/from stand   Upper Body Dressing : Set up;Supervision/safety;Sitting   Lower Body Dressing: Supervision/safety;Min guard;Sit to/from stand   Toilet Transfer: Supervision/safety;Min guard;Ambulation;Regular Toilet;Grab bars   Toileting- Clothing Manipulation and Hygiene: Supervision/safety;Min guard;Sit to/from stand       Functional mobility during ADLs: Min guard General ADL Comments: Pt able to ambulate to/from bathroom without an assistive device. Noted 0 instances of LOB.  Vision Baseline Vision/History: Wears glasses Wears Glasses: (for driving)       Perception     Praxis      Pertinent Vitals/Pain Pain Assessment: No/denies pain     Hand Dominance Right   Extremity/Trunk Assessment Upper Extremity Assessment Upper Extremity Assessment: Overall WFL for tasks assessed   Lower Extremity  Assessment Lower Extremity Assessment: Defer to PT evaluation       Communication Communication Communication: No difficulties   Cognition Arousal/Alertness: Awake/alert Behavior During Therapy: WFL for tasks assessed/performed Overall Cognitive Status: No family/caregiver present to determine baseline cognitive functioning                                     General Comments  Pt on 2L [place] with SpO2 94% at rest. Pt completed self-care tasks on room air with SpO2 dropping to low 80s. Pt required 2-4 min seated recovery to return to 90% on room air.    Exercises Exercises: Other exercises Other Exercises Other Exercises: Incentive spirometer x 10 with min cues on technique. Pt pulling 530mL.  Other Exercises: Encouraged pursed lip breathing throughout   Shoulder Instructions      Home Living Family/patient expects to be discharged to:: Private residence Living Arrangements: Spouse/significant other;Children(son) Available Help at Discharge: Family(Son works during the day) Type of Home: House Home Access: [place]: Two level;Able to live on main level with bedroom/bathroom     Bathroom Shower/Tub: Tub/shower unit;Walk-in shower(primarily uses walk-in shower)   Bathroom Toilet: Standard     Home Equipment: Environmental consultant - 2 wheels;Cane - single point;Grab bars - tub/shower;Grab bars - toilet          Prior Functioning/Environment Level of Independence: Independent        Comments: Pt independent with ADLs and mobility. Pt occasionally uses a cane or walker for mobility when he's not feeling good. Pt reports 0 falls in the last 6 months. Pt reports that he and his wife share IADL responsibilities. Pt still drives and works as a Administrator.        OT Problem List: Decreased strength;Decreased activity tolerance;Impaired balance (sitting and/or standing);Cardiopulmonary status limiting activity      OT Treatment/Interventions:  Self-care/ADL training;Therapeutic exercise;Neuromuscular education;Energy conservation;DME and/or AE instruction;Therapeutic activities;Patient/family education;Balance training    OT Goals(Current goals can be found in the care plan section) Acute Rehab OT Goals Patient Stated Goal: to go home Time For Goal Achievement: 08/31/19 Potential to Achieve Goals: Good ADL Goals Additional ADL Goal #1: Pt to complete ADLs independently with SpO2 maintaining in 90s on room air. Additional ADL Goal #2: Pt to tolerate standing up to 10 min independently, in preparation for ADLs. Additional ADL Goal #3: Pt to recall and verbalize 3 energy conservation strategies with 0 verbal cues. Additional ADL Goal #4: Pt to recall and demonstrate breathing exercises with 0 verbal cues on technique.  OT Frequency: Min 3X/week   Barriers to D/C:            Co-evaluation              AM-PAC OT "6 Clicks" Daily Activity     Outcome Measure Help from another person eating meals?: None Help from another person taking care of personal grooming?: A Little Help from another person toileting, which includes using toliet, bedpan, or urinal?: A Little Help from another person bathing (including washing, rinsing, drying)?:  A Little Help from another person to put on and taking off regular upper body clothing?: A Little Help from another person to put on and taking off regular lower body clothing?: A Little 6 Click Score: 19   End of Session Equipment Utilized During Treatment: Oxygen Nurse Communication: Mobility status  Activity Tolerance: Patient limited by fatigue(Limited by SOB) Patient left: in chair;with call bell/phone within reach  OT Visit Diagnosis: Unsteadiness on feet (R26.81);Muscle weakness (generalized) (M62.81)                Time: 2774-1287 OT Time Calculation (min): 39 min Charges:  OT General Charges $OT Visit: 1 Visit OT Evaluation $OT Eval Low Complexity: 1 Low OT Treatments $Self  Care/Home Management : 8-22 mins $Therapeutic Activity: 8-22 mins  Peterson Ao OTR/L [phone]   Peterson Ao 08/17/2019, 3:26 PM

## 2019-08-17 NOTE — Progress Notes (Signed)
PROGRESS NOTE  Ricky Bailey YIR:[id number] DOB: [birth year] DOA: 08/14/2019  PCP: Christ Kick, MD  Brief History/Interval Summary: 64 y.o. male with history h/o hypertension, hyperlipidemia, BPH, gout presented to the ED with complaints of dry cough associated with fevers (T-max 102F at home) and progressive dyspnea over 3 days.  Patient states his wife had been sick for 2 weeks and diagnosed with COVID-19 on 08/11/19.  The following day patient started having symptoms which progressively worsened. Patient tested positive for COVID-19 (both POC and PCR) at urgent care in [place].  He was also experiencing loose stools.  Patient was noted to be hypoxic.  He was hospitalized for further management.     Reason for Visit: Pneumonia due to COVID-19.  Acute respiratory failure with hypoxia  Consultants: None  Procedures: None  Antibiotics: Anti-infectives (From admission, onward)   Start     Dose/Rate Route Frequency Ordered Stop   08/15/19 1000  remdesivir 100 mg in sodium chloride 0.9 % 100 mL IVPB     100 mg 200 mL/hr over 30 Minutes Intravenous Daily 08/14/19 1441 08/19/19 0959   08/14/19 1500  remdesivir 200 mg in sodium chloride 0.9% 250 mL IVPB     200 mg 580 mL/hr over 30 Minutes Intravenous Once 08/14/19 1441 08/14/19 1617      Subjective/Interval History: Patient states that is slightly better today.  Still having shortness of breath with exertion.  Occasional dry cough.  No nausea vomiting.      Assessment/Plan:  Acute Hypoxic Resp. Failure/Pneumonia due to COVID-19   Recent Labs  Lab 08/14/19 1242 08/15/19 0044 08/16/19 0214 08/17/19 0340  DDIMER 1.26* 1.14* 1.06* 0.77*  FERRITIN 1,393* 1,386* 1,408* 1,127*  CRP 14.8* 15.2* 7.7* 3.4*  ALT 43 39 38 40  PROCALCITON <0.10  --   --   --     Objective findings: Fever: Remains afebrile Oxygen requirements: Nasal cannula 2 L.  Saturating in the early 90s.    COVID 19 Therapeutics: Antibacterials:  None Remdesivir: Day 4 Steroids: Dexamethasone 6 mg every 12 hours Diuretics: None Actemra: Discussed with patient.  Will consider if he worsens. Convalescent Plasma: Transfused on 2/5 Vitamin C and Zinc: Continue PUD Prophylaxis: Pepcid DVT Prophylaxis:  Lovenox   From a respiratory standpoint patient remained stable.  His CRP is trending downwards.  D-dimer is also better.  Patient remains on remdesivir and steroids.  He also received convalescent plasma.  Continue mobilization, incentive spirometry, out of bed to chair.  Seen by physical therapy.  Will likely need home oxygen at discharge.  Hyponatremia Resolved.  Hypokalemia Repleted.  Leukopenia Due to COVID-19.  History of gout Continue allopurinol  History of BPH Continue finasteride.  History of BPPV Antivert as needed.  Obesity Estimated body mass index is 35.61 kg/m as calculated from the following:   Height as of this encounter: 5\' 11"  (1.803 m).   Weight as of this encounter: 115.8 kg.    DVT Prophylaxis: Lovenox Code Status: Full code Family Communication: Daughter being updated daily. Disposition Plan: Hopefully return home when improved.  PT evaluation.   Medications:  Scheduled: . albuterol  1 puff Inhalation QID  . allopurinol  300 mg Oral Daily  . vitamin C  500 mg Oral Daily  . dexamethasone (DECADRON) injection  6 mg Intravenous Q12H  . enoxaparin (LOVENOX) injection  0.5 mg/kg Subcutaneous Q24H  . famotidine  20 mg Oral Daily  . finasteride  5 mg Oral Daily  . rosuvastatin  20  mg Oral Daily  . zinc sulfate  220 mg Oral Daily   Continuous: . remdesivir 100 mg in NS 100 mL Stopped (08/17/19 0910)  . 0.9 % sodium chloride with kcl 75 mL/hr at 08/17/19 0701   PYP:[id number], albuterol, guaiFENesin-dextromethorphan, hydrOXYzine, loperamide, meclizine, traMADol   Objective:  Vital Signs  Vitals:   08/17/19 0045 08/17/19 0430 08/17/19 0555 08/17/19 0732  BP: 129/90   130/86   Pulse: 68 61 61 69  Resp: 20 18  19   Temp: 98.2 F (36.8 C) 98 F (36.7 C)  98.4 F (36.9 C)  TempSrc: Oral Oral  Oral  SpO2: 92% 91% 91% 90%  Weight:      Height:        Intake/Output Summary (Last 24 hours) at 08/17/2019 1132 Last data filed at 08/17/2019 0910 Gross per 24 hour  Intake 2367.22 ml  Output 2150 ml  Net 217.22 ml   Filed Weights   08/14/19 1720  Weight: 115.8 kg    General appearance: Awake alert.  In no distress Resp: Mildly tachypneic at rest.  Coarse breath sounds bilaterally with a few crackles at the bases.  No wheezing or rhonchi. Cardio: S1-S2 is normal regular.  No S3-S4.  No rubs murmurs or bruit GI: Abdomen is soft.  Nontender nondistended.  Bowel sounds are present normal.  No masses organomegaly Extremities: No edema.  Neurologic: Alert and oriented x3.  No focal neurological deficits.     Lab Results:  Data Reviewed: I have personally reviewed following labs and imaging studies  CBC: Recent Labs  Lab 08/14/19 1242 08/15/19 0044 08/16/19 0214 08/17/19 0340  WBC 4.8 3.9* 7.3 8.7  NEUTROABS 3.6 3.3 6.3 7.3  HGB 13.5 13.3 14.2 14.0  HCT 40.0 39.4 42.6 41.7  MCV 89.1 88.3 88.8 89.9  PLT 145* 162 213 236    Basic Metabolic Panel: Recent Labs  Lab 08/14/19 1242 08/15/19 0044 08/16/19 0214 08/17/19 0340  NA 130* 135 137 140  K 3.2* 3.9 4.1 4.3  CL 95* 101 101 108  CO2 23 23 19* 22  GLUCOSE 119* 128* 153* 117*  BUN 17 19 27* 25*  CREATININE 1.14 1.06 0.84 0.81  CALCIUM 8.1* 8.2* 8.2* 8.4*  MG  --  1.9 2.1 2.3    GFR: Estimated Creatinine Clearance: 120.8 mL/min (by C-G formula based on SCr of 0.81 mg/dL).  Liver Function Tests: Recent Labs  Lab 08/14/19 1242 08/15/19 0044 08/16/19 0214 08/17/19 0340  AST 55* 49* 44* 39  ALT 43 39 38 40  ALKPHOS 38 36* 41 42  BILITOT 1.2 1.1 0.8 0.9  PROT 7.4 7.2 7.3 6.9  ALBUMIN 3.5 3.1* 3.3* 3.0*     Lipid Profile: Recent Labs    08/14/19 1242  TRIG 55    Anemia  Panel: Recent Labs    08/16/19 0214 08/17/19 0340  FERRITIN 1,408* 1,127*      Radiology Studies: No results found.     LOS: 3 days   Ricky Bailey 10/15/19 on [email or web address]  08/17/2019, 11:32 AM  '

## 2019-08-18 LAB — D-DIMER, QUANTITATIVE: D-Dimer, Quant: 0.86 ug/mL-FEU — ABNORMAL HIGH (ref 0.00–0.50)

## 2019-08-18 LAB — CBC WITH DIFFERENTIAL/PLATELET
Abs Immature Granulocytes: 0.14 10*3/uL — ABNORMAL HIGH (ref 0.00–0.07)
Basophils Absolute: 0.1 10*3/uL (ref 0.0–0.1)
Basophils Relative: 1 %
Eosinophils Absolute: 0 10*3/uL (ref 0.0–0.5)
Eosinophils Relative: 0 %
HCT: 41.5 % (ref 39.0–52.0)
Hemoglobin: 14 g/dL (ref 13.0–17.0)
Immature Granulocytes: 1 %
Lymphocytes Relative: 12 %
Lymphs Abs: 1.3 10*3/uL (ref 0.7–4.0)
MCH: 30 pg (ref 26.0–34.0)
MCHC: 33.7 g/dL (ref 30.0–36.0)
MCV: 89.1 fL (ref 80.0–100.0)
Monocytes Absolute: 0.6 10*3/uL (ref 0.1–1.0)
Monocytes Relative: 6 %
Neutro Abs: 8.4 10*3/uL — ABNORMAL HIGH (ref 1.7–7.7)
Neutrophils Relative %: 80 %
Platelets: 275 10*3/uL (ref 150–400)
RBC: 4.66 MIL/uL (ref 4.22–5.81)
RDW: 13 % (ref 11.5–15.5)
WBC: 10.5 10*3/uL (ref 4.0–10.5)
nRBC: 0 % (ref 0.0–0.2)

## 2019-08-18 LAB — COMPREHENSIVE METABOLIC PANEL
ALT: 52 U/L — ABNORMAL HIGH (ref 0–44)
AST: 44 U/L — ABNORMAL HIGH (ref 15–41)
Albumin: 3 g/dL — ABNORMAL LOW (ref 3.5–5.0)
Alkaline Phosphatase: 44 U/L (ref 38–126)
Anion gap: 11 (ref 5–15)
BUN: 28 mg/dL — ABNORMAL HIGH (ref 8–23)
CO2: 22 mmol/L (ref 22–32)
Calcium: 8.6 mg/dL — ABNORMAL LOW (ref 8.9–10.3)
Chloride: 106 mmol/L (ref 98–111)
Creatinine, Ser: 0.83 mg/dL (ref 0.61–1.24)
GFR calc Af Amer: >60 mL/min (ref >60)
GFR calc non Af Amer: >60 mL/min (ref >60)
Glucose, Bld: 109 mg/dL — ABNORMAL HIGH (ref 70–99)
Potassium: 4.3 mmol/L (ref 3.5–5.1)
Sodium: 139 mmol/L (ref 135–145)
Total Bilirubin: 0.7 mg/dL (ref 0.3–1.2)
Total Protein: 6.8 g/dL (ref 6.5–8.1)

## 2019-08-18 LAB — FERRITIN: Ferritin: 1157 ng/mL — ABNORMAL HIGH (ref 24–336)

## 2019-08-18 LAB — C-REACTIVE PROTEIN: CRP: 1.9 mg/dL — ABNORMAL HIGH (ref <1.0)

## 2019-08-18 LAB — MAGNESIUM: Magnesium: 2.3 mg/dL (ref 1.7–2.4)

## 2019-08-18 MED ORDER — DEXAMETHASONE 6 MG PO TABS
6.0000 mg | ORAL_TABLET | Freq: Every day | ORAL | Status: DC
  Administered 2019-08-19: 6 mg via ORAL
  Filled 2019-08-18: qty 1

## 2019-08-18 MED ORDER — FUROSEMIDE 10 MG/ML IJ SOLN
40.0000 mg | Freq: Once | INTRAMUSCULAR | Status: AC
  Administered 2019-08-18: 17:00:00 40 mg via INTRAVENOUS
  Filled 2019-08-18: qty 4

## 2019-08-18 NOTE — Progress Notes (Signed)
PROGRESS NOTE  Ricky Bailey OYD:[id number] DOB: [birth year] DOA: 08/14/2019  PCP: Christ Kick, MD  Brief History/Interval Summary: 64 y.o. male with history h/o hypertension, hyperlipidemia, BPH, gout presented to the ED with complaints of dry cough associated with fevers (T-max 102F at home) and progressive dyspnea over 3 days.  Patient states his wife had been sick for 2 weeks and diagnosed with COVID-19 on 08/11/19.  The following day patient started having symptoms which progressively worsened. Patient tested positive for COVID-19 (both POC and PCR) at urgent care in [place].  He was also experiencing loose stools.  Patient was noted to be hypoxic.  He was hospitalized for further management.     Reason for Visit: Pneumonia due to COVID-19.  Acute respiratory failure with hypoxia  Consultants: None  Procedures: None  Antibiotics: Anti-infectives (From admission, onward)   Start     Dose/Rate Route Frequency Ordered Stop   08/15/19 1000  remdesivir 100 mg in sodium chloride 0.9 % 100 mL IVPB     100 mg 200 mL/hr over 30 Minutes Intravenous Daily 08/14/19 1441 08/18/19 1120   08/14/19 1500  remdesivir 200 mg in sodium chloride 0.9% 250 mL IVPB     200 mg 580 mL/hr over 30 Minutes Intravenous Once 08/14/19 1441 08/14/19 1617      Subjective/Interval History: Patient states that he seems to be improving.  He is getting less short of breath with exertion.  Continues to have occasional dry cough.  No nausea or vomiting    Assessment/Plan:  Acute Hypoxic Resp. Failure/Pneumonia due to COVID-19   Recent Labs  Lab 08/14/19 1242 08/15/19 0044 08/16/19 0214 08/17/19 0340 08/18/19 0400  DDIMER 1.26* 1.14* 1.06* 0.77* 0.86*  FERRITIN 1,393* 1,386* 1,408* 1,127* 1,157*  CRP 14.8* 15.2* 7.7* 3.4* 1.9*  ALT 43 39 38 40 52*  PROCALCITON <0.10  --   --   --   --     Objective findings: Fever: Remains afebrile Oxygen requirements: Nasal cannula 1 to 2 L.  Saturating  in the early 90s.     COVID 19 Therapeutics: Antibacterials: None Remdesivir: Day 5 Steroids: Dexamethasone 6 mg every 12 hours.  Start tapering. Diuretics: None Actemra: not given yet Convalescent Plasma: Transfused on 2/5 Vitamin C and Zinc: Continue PUD Prophylaxis: Pepcid DVT Prophylaxis:  Lovenox   Patient seems to be improving from a respiratory standpoint.  He is feeling better.  Not as dyspneic with exertion as before.  However still tends to desaturate.  Will likely require home oxygen.  He will complete course of remdesivir today.  His inflammatory markers have improved.  Physical therapy is following.  Will likely go home tomorrow if he continues to improve.  Hyponatremia Resolved.  Hypokalemia Repleted.  Leukopenia Due to COVID-19.  Resolved.  History of gout Continue allopurinol  History of BPH Continue finasteride.  History of BPPV Antivert as needed.  Obesity Estimated body mass index is 35.61 kg/m as calculated from the following:   Height as of this encounter: 5\' 11"  (1.803 m).   Weight as of this encounter: 115.8 kg.    DVT Prophylaxis: Lovenox Code Status: Full code Family Communication: Daughter being updated daily. Disposition Plan: Anticipate discharge home tomorrow.   Medications:  Scheduled: . albuterol  1 puff Inhalation QID  . allopurinol  300 mg Oral Daily  . vitamin C  500 mg Oral Daily  . dexamethasone (DECADRON) injection  6 mg Intravenous Q12H  . enoxaparin (LOVENOX) injection  0.5 mg/kg Subcutaneous  Q24H  . famotidine  20 mg Oral Daily  . finasteride  5 mg Oral Daily  . rosuvastatin  20 mg Oral Daily  . zinc sulfate  220 mg Oral Daily   Continuous:  HUT:[id number], albuterol, guaiFENesin-dextromethorphan, hydrOXYzine, loperamide, meclizine, traMADol   Objective:  Vital Signs  Vitals:   08/17/19 2010 08/18/19 0451 08/18/19 0733 08/18/19 1141  BP: (!) 159/88 (!) 143/90 (!) 137/92 (!) 148/95  Pulse: 68 69 68 71    Resp: 20 20 18 20   Temp: 98.2 F (36.8 C) 98 F (36.7 C)  98.9 F (37.2 C)  TempSrc: Oral Oral  Axillary  SpO2: 92% 93%  95%  Weight:      Height:        Intake/Output Summary (Last 24 hours) at 08/18/2019 1153 Last data filed at 08/18/2019 0800 Gross per 24 hour  Intake 2353.29 ml  Output 2375 ml  Net -21.71 ml   Filed Weights   08/14/19 1720  Weight: 115.8 kg    General appearance: Awake alert.  In no distress Resp: Few crackles bilateral bases.  No wheezing or rhonchi. Cardio: S1-S2 is normal regular.  No S3-S4.  No rubs murmurs or bruit GI: Abdomen is soft.  Nontender nondistended.  Bowel sounds are present normal.  No masses organomegaly Extremities: No edema.  Full range of motion of lower extremities. Neurologic: Alert and oriented x3.  No focal neurological deficits.     Lab Results:  Data Reviewed: I have personally reviewed following labs and imaging studies  CBC: Recent Labs  Lab 08/14/19 1242 08/15/19 0044 08/16/19 0214 08/17/19 0340 08/18/19 0400  WBC 4.8 3.9* 7.3 8.7 10.5  NEUTROABS 3.6 3.3 6.3 7.3 8.4*  HGB 13.5 13.3 14.2 14.0 14.0  HCT 40.0 39.4 42.6 41.7 41.5  MCV 89.1 88.3 88.8 89.9 89.1  PLT 145* 162 213 236 275    Basic Metabolic Panel: Recent Labs  Lab 08/14/19 1242 08/15/19 0044 08/16/19 0214 08/17/19 0340 08/18/19 0400  NA 130* 135 137 140 139  K 3.2* 3.9 4.1 4.3 4.3  CL 95* 101 101 108 106  CO2 23 23 19* 22 22  GLUCOSE 119* 128* 153* 117* 109*  BUN 17 19 27* 25* 28*  CREATININE 1.14 1.06 0.84 0.81 0.83  CALCIUM 8.1* 8.2* 8.2* 8.4* 8.6*  MG  --  1.9 2.1 2.3 2.3    GFR: Estimated Creatinine Clearance: 117.9 mL/min (by C-G formula based on SCr of 0.83 mg/dL).  Liver Function Tests: Recent Labs  Lab 08/14/19 1242 08/15/19 0044 08/16/19 0214 08/17/19 0340 08/18/19 0400  AST 55* 49* 44* 39 44*  ALT 43 39 38 40 52*  ALKPHOS 38 36* 41 42 44  BILITOT 1.2 1.1 0.8 0.9 0.7  PROT 7.4 7.2 7.3 6.9 6.8  ALBUMIN 3.5 3.1* 3.3*  3.0* 3.0*     Anemia Panel: Recent Labs    08/17/19 0340 08/18/19 0400  FERRITIN 1,127* 1,157*      Radiology Studies: No results found.     LOS: 4 days   Trudi Morgenthaler 10/16/19 on [email or web address]  08/18/2019, 11:53 AM  '

## 2019-08-18 NOTE — Progress Notes (Signed)
1715Pt arrived via w/c from Rm 141 Pt alert and oriented x3.  Pt VSS pt reports no chest pain or shortness of breath  Currently on O2 at 1L per [place]

## 2019-08-18 NOTE — Progress Notes (Signed)
SATURATION QUALIFICATIONS: (This note is used to comply with regulatory documentation for home oxygen)  Patient Saturations on Room Air at Rest = 91%  Patient Saturations on Room Air while Ambulating = 83%  Patient Saturations on 4 Liters of oxygen while Ambulating = 89%  Please briefly explain why patient needs home oxygen: Pt requires supplemental oxygen to maintain SpO2 >/88% with activity.  Ina Homes, PT, DPT Acute Rehabilitation Services  Pager [phone] Office [phone]

## 2019-08-18 NOTE — Plan of Care (Signed)
Pt updated several family members via cell phone. No questions or concerns for RN to address per pt. VSS on 1L [place]. PT performed ambulation O2 test. Plan to d/c home 2/9 on O2. Will continue POC.  Problem: Education: Goal: Knowledge of risk factors and measures for prevention of condition will improve Outcome: Progressing   Problem: Coping: Goal: Psychosocial and spiritual needs will be supported Outcome: Progressing   Problem: Respiratory: Goal: Will maintain a patent airway Outcome: Progressing Goal: Complications related to the disease process, condition or treatment will be avoided or minimized Outcome: Progressing

## 2019-08-18 NOTE — Progress Notes (Signed)
Physical Therapy Treatment Patient Details Name: Ricky Bailey MRN: [id number] DOB: 1956-03-26 Today's Date: 08/18/2019    History of Present Illness Pt is a 64 y.o. male dx with COVID-19 on 08/11/19, admitted 08/14/19 with fever, cough and worsening SOB. Worked up for acute respiratory failure with hypoxia secondary to COVID PNA. PMH includes HTN, HLD, BPH.   PT Comments    Pt progressing well with mobility. Pt remains limited by decreased activity tolerance, requiring seated and standing rest breaks with ambulation and ADL tasks. SpO2 down to 86% on 3L O2 [place] with ambulation, requiring increase to 4L. Educ re: energy conservation, pursed lip breathing, activity recommendations, seated/standing therex, potential for home O2 use, and importance of continued mobility.   Follow Up Recommendations  No PT follow up     Equipment Recommendations  None recommended by PT    Recommendations for Other Services       Precautions / Restrictions Precautions Precautions: Other (comment) Precaution Comments: Monitor SpO2 Restrictions Weight Bearing Restrictions: No    Mobility  Bed Mobility               General bed mobility comments: Seated EOB upon arrival  Transfers Overall transfer level: Independent Equipment used: None Transfers: Sit to/from [employer] transfer comment: Indep to stand from bed, toilet and recliner without DME  Ambulation/Gait Ambulation/Gait assistance: Supervision Gait Distance (Feet): 540 Feet Assistive device: None Gait Pattern/deviations: Step-through pattern;Decreased stride length   Gait velocity interpretation: 1.31 - 2.62 ft/sec, indicative of limited community ambulator General Gait Details: Slow, steady gait without DME, supervision for lines and to monitor SpO2. Required 1x seated rest after ambulation in room and using bathroom prior to entering hallway. Pt with DOE up to 3/4, cued to take 2x standing rest break with pursed lip  breathing; SpO2 down to 83% on RA, down to 86% on 3L and increased to 4L   Stairs             Wheelchair Mobility    Modified Rankin (Stroke Patients Only)       Balance Overall balance assessment: Needs assistance   Sitting balance-Leahy Scale: Good       Standing balance-Leahy Scale: Good                              Cognition Arousal/Alertness: Awake/alert Behavior During Therapy: WFL for tasks assessed/performed;Flat affect Overall Cognitive Status: Within Functional Limits for tasks assessed                                 General Comments: WFL for simple tasks; not formally assessed      Exercises Other Exercises Other Exercises: Incentive spirometer pulling ~765mL Other Exercises: Educ on seated/standing therex options within confines of O2/lines - including repeated sit<>stands, marching in place, steps forwards/backwards    General Comments        Pertinent Vitals/Pain Pain Assessment: No/denies pain    Home Living                      Prior Function            PT Goals (current goals can now be found in the care plan section) Acute Rehab PT Goals Patient Stated Goal: Home tomorrow Progress towards PT goals: Progressing toward goals  Frequency    Min 3X/week      PT Plan Current plan remains appropriate    Co-evaluation              AM-PAC PT "6 Clicks" Mobility   Outcome Measure  Help needed turning from your back to your side while in a flat bed without using bedrails?: None Help needed moving from lying on your back to sitting on the side of a flat bed without using bedrails?: None Help needed moving to and from a bed to a chair (including a wheelchair)?: None Help needed standing up from a chair using your arms (e.g., wheelchair or bedside chair)?: None Help needed to walk in hospital room?: None Help needed climbing 3-5 steps with a railing? : A Little 6 Click Score: 23    End  of Session Equipment Utilized During Treatment: Oxygen Activity Tolerance: Patient tolerated treatment well Patient left: in chair;with call bell/phone within reach Nurse Communication: Mobility status PT Visit Diagnosis: Other abnormalities of gait and mobility (R26.89)     Time: 9323-5573 PT Time Calculation (min) (ACUTE ONLY): 30 min  Charges:  $Therapeutic Exercise: 23-37 mins                    Mabeline Caras, PT, DPT Acute Rehabilitation Services  Pager [phone] Office [place] 08/18/2019, 10:39 AM

## 2019-08-19 LAB — CULTURE, BLOOD (ROUTINE X 2)
Culture: NO GROWTH
Culture: NO GROWTH
Special Requests: ADEQUATE
Special Requests: ADEQUATE

## 2019-08-19 MED ORDER — ZINC SULFATE 220 (50 ZN) MG PO CAPS: 220.0000 mg | ORAL_CAPSULE | Freq: Every day | ORAL | 0 refills | Status: AC

## 2019-08-19 MED ORDER — FAMOTIDINE 20 MG PO TABS: 20.0000 mg | ORAL_TABLET | Freq: Every day | ORAL | 0 refills | Status: DC

## 2019-08-19 MED ORDER — DEXAMETHASONE 2 MG PO TABS: ORAL_TABLET | ORAL | 0 refills | Status: DC

## 2019-08-19 MED ORDER — ASCORBIC ACID 500 MG PO TABS: 500.0000 mg | ORAL_TABLET | Freq: Every day | ORAL | 0 refills | Status: AC

## 2019-08-19 NOTE — Discharge Instructions (Signed)
COVID-19 COVID-19 is a respiratory infection that is caused by a virus called severe acute respiratory syndrome coronavirus 2 (SARS-CoV-2). The disease is also known as coronavirus disease or novel coronavirus. In some people, the virus may not cause any symptoms. In others, it may cause a serious infection. The infection can get worse quickly and can lead to complications, such as:  Pneumonia, or infection of the lungs.  Acute respiratory distress syndrome or ARDS. This is a condition in which fluid build-up in the lungs prevents the lungs from filling with air and passing oxygen into the blood.  Acute respiratory failure. This is a condition in which there is not enough oxygen passing from the lungs to the body or when carbon dioxide is not passing from the lungs out of the body.  Sepsis or septic shock. This is a serious bodily reaction to an infection.  Blood clotting problems.  Secondary infections due to bacteria or fungus.  Organ failure. This is when your body's organs stop working. The virus that causes COVID-19 is contagious. This means that it can spread from person to person through droplets from coughs and sneezes (respiratory secretions). What are the causes? This illness is caused by a virus. You may catch the virus by:  Breathing in droplets from an infected person. Droplets can be spread by a person breathing, speaking, singing, coughing, or sneezing.  Touching something, like a table or a doorknob, that was exposed to the virus (contaminated) and then touching your mouth, nose, or eyes. What increases the risk? Risk for infection You are more likely to be infected with this virus if you:  Are within 6 feet (2 meters) of a person with COVID-19.  Provide care for or live with a person who is infected with COVID-19.  Spend time in crowded indoor spaces or live in shared housing. Risk for serious illness You are more likely to become seriously ill from the virus if you:   Are 50 years of age or older. The higher your age, the more you are at risk for serious illness.  Live in a nursing home or long-term care facility.  Have cancer.  Have a long-term (chronic) disease such as: ? Chronic lung disease, including chronic obstructive pulmonary disease or asthma. ? A long-term disease that lowers your body's ability to fight infection (immunocompromised). ? Heart disease, including heart failure, a condition in which the arteries that lead to the heart become narrow or blocked (coronary artery disease), a disease which makes the heart muscle thick, weak, or stiff (cardiomyopathy). ? Diabetes. ? Chronic kidney disease. ? Sickle cell disease, a condition in which red blood cells have an abnormal "sickle" shape. ? Liver disease.  Are obese. What are the signs or symptoms? Symptoms of this condition can range from mild to severe. Symptoms may appear any time from 2 to 14 days after being exposed to the virus. They include:  A fever or chills.  A cough.  Difficulty breathing.  Headaches, body aches, or muscle aches.  Runny or stuffy (congested) nose.  A sore throat.  New loss of taste or smell. Some people may also have stomach problems, such as nausea, vomiting, or diarrhea. Other people may not have any symptoms of COVID-19. How is this diagnosed? This condition may be diagnosed based on:  Your signs and symptoms, especially if: ? You live in an area with a COVID-19 outbreak. ? You recently traveled to or from an area where the virus is common. ? You   provide care for or live with a person who was diagnosed with COVID-19. ? You were exposed to a person who was diagnosed with COVID-19.  A physical exam.  Lab tests, which may include: ? Taking a sample of fluid from the back of your nose and throat (nasopharyngeal fluid), your nose, or your throat using a swab. ? A sample of mucus from your lungs (sputum). ? Blood tests.  Imaging tests, which  may include, X-rays, CT scan, or ultrasound. How is this treated? At present, there is no medicine to treat COVID-19. Medicines that treat other diseases are being used on a trial basis to see if they are effective against COVID-19. Your health care provider will talk with you about ways to treat your symptoms. For most people, the infection is mild and can be managed at home with rest, fluids, and over-the-counter medicines. Treatment for a serious infection usually takes places in a hospital intensive care unit (ICU). It may include one or more of the following treatments. These treatments are given until your symptoms improve.  Receiving fluids and medicines through an IV.  Supplemental oxygen. Extra oxygen is given through a tube in the nose, a face mask, or a hood.  Positioning you to lie on your stomach (prone position). This makes it easier for oxygen to get into the lungs.  Continuous positive airway pressure (CPAP) or bi-level positive airway pressure (BPAP) machine. This treatment uses mild air pressure to keep the airways open. A tube that is connected to a motor delivers oxygen to the body.  Ventilator. This treatment moves air into and out of the lungs by using a tube that is placed in your windpipe.  Tracheostomy. This is a procedure to create a hole in the neck so that a breathing tube can be inserted.  Extracorporeal membrane oxygenation (ECMO). This procedure gives the lungs a chance to recover by taking over the functions of the heart and lungs. It supplies oxygen to the body and removes carbon dioxide. Follow these instructions at home: Lifestyle  If you are sick, stay home except to get medical care. Your health care provider will tell you how long to stay home. Call your health care provider before you go for medical care.  Rest at home as told by your health care provider.  Do not use any products that contain nicotine or tobacco, such as cigarettes, e-cigarettes, and  chewing tobacco. If you need help quitting, ask your health care provider.  Return to your normal activities as told by your health care provider. Ask your health care provider what activities are safe for you. General instructions  Take over-the-counter and prescription medicines only as told by your health care provider.  Drink enough fluid to keep your urine pale yellow.  Keep all follow-up visits as told by your health care provider. This is important. How is this prevented?  There is no vaccine to help prevent COVID-19 infection. However, there are steps you can take to protect yourself and others from this virus. To protect yourself:   Do not travel to areas where COVID-19 is a risk. The areas where COVID-19 is reported change often. To identify high-risk areas and travel restrictions, check the CDC travel website: wwwnc.cdc.gov/travel/notices  If you live in, or must travel to, an area where COVID-19 is a risk, take precautions to avoid infection. ? Stay away from people who are sick. ? Wash your hands often with soap and water for 20 seconds. If soap and water   are not available, use an alcohol-based hand sanitizer. ? Avoid touching your mouth, face, eyes, or nose. ? Avoid going out in public, follow guidance from your state and local health authorities. ? If you must go out in public, wear a cloth face covering or face mask. Make sure your mask covers your nose and mouth. ? Avoid crowded indoor spaces. Stay at least 6 feet (2 meters) away from others. ? Disinfect objects and surfaces that are frequently touched every day. This may include:  Counters and tables.  Doorknobs and light switches.  Sinks and faucets.  Electronics, such as phones, remote controls, keyboards, computers, and tablets. To protect others: If you have symptoms of COVID-19, take steps to prevent the virus from spreading to others.  If you think you have a COVID-19 infection, contact your health care  provider right away. Tell your health care team that you think you may have a COVID-19 infection.  Stay home. Leave your house only to seek medical care. Do not use public transport.  Do not travel while you are sick.  Wash your hands often with soap and water for 20 seconds. If soap and water are not available, use alcohol-based hand sanitizer.  Stay away from other members of your household. Let healthy household members care for children and pets, if possible. If you have to care for children or pets, wash your hands often and wear a mask. If possible, stay in your own room, separate from others. Use a different bathroom.  Make sure that all people in your household wash their hands well and often.  Cough or sneeze into a tissue or your sleeve or elbow. Do not cough or sneeze into your hand or into the air.  Wear a cloth face covering or face mask. Make sure your mask covers your nose and mouth. Where to find more information  Centers for Disease Control and Prevention: www.cdc.gov/coronavirus/2019-ncov/index.html  World Health Organization: www.who.int/health-topics/coronavirus Contact a health care provider if:  You live in or have traveled to an area where COVID-19 is a risk and you have symptoms of the infection.  You have had contact with someone who has COVID-19 and you have symptoms of the infection. Get help right away if:  You have trouble breathing.  You have pain or pressure in your chest.  You have confusion.  You have bluish lips and fingernails.  You have difficulty waking from sleep.  You have symptoms that get worse. These symptoms may represent a serious problem that is an emergency. Do not wait to see if the symptoms will go away. Get medical help right away. Call your local emergency services (911 in the [place] Do not drive yourself to the hospital. Let the emergency medical personnel know if you think you have COVID-19. Summary  COVID-19 is a  respiratory infection that is caused by a virus. It is also known as coronavirus disease or novel coronavirus. It can cause serious infections, such as pneumonia, acute respiratory distress syndrome, acute respiratory failure, or sepsis.  The virus that causes COVID-19 is contagious. This means that it can spread from person to person through droplets from breathing, speaking, singing, coughing, or sneezing.  You are more likely to develop a serious illness if you are 50 years of age or older, have a weak immune system, live in a nursing home, or have chronic disease.  There is no medicine to treat COVID-19. Your health care provider will talk with you about ways to treat your symptoms.    Take steps to protect yourself and others from infection. Wash your hands often and disinfect objects and surfaces that are frequently touched every day. Stay away from people who are sick and wear a mask if you are sick. This information is not intended to replace advice given to you by your health care provider. Make sure you discuss any questions you have with your health care provider. Document Revised: 04/25/2019 Document Reviewed: 08/01/2018 [employer] Patient Education  2020 [employer]  

## 2019-08-19 NOTE — Discharge Summary (Signed)
[facility]  Physician Discharge Summary   Patient ID: Ricky Bailey MRN: [id number] DOB/AGE: 1956/01/25 64 y.o.  Admit date: 08/14/2019 Discharge date: 08/19/2019  PCP: Anselmo Pickler, MD  DISCHARGE DIAGNOSES:  Acute respiratory failure with hypoxia Pneumonia due to COVID-19 Hyponatremia resolved Hypokalemia resolved Leukopenia due to COVID-19 History of gout History of BPH History of BPPV Obesity  RECOMMENDATIONS FOR OUTPATIENT FOLLOW UP: Follow-up with PCP. Home oxygen to be arranged. Ambulatory referral to pulmonology   Home Health: None Equipment/Devices: Home oxygen  CODE STATUS: Full code  DISCHARGE CONDITION: fair  Diet recommendation: As before  INITIAL HISTORY: 63 y.o.malewith history h/ohypertension, hyperlipidemia, BPH, gout presented to the ED with complaints ofdry cough associated with fevers (T-max 102F at home) and progressive dyspnea over 3 days. Patient states his wife had been sick for 2 weeks and diagnosed with COVID-19 on 08/11/19. The following day patient started having symptoms which progressively worsened. [place]  He was also experiencing loose stools.  Patient was noted to be hypoxic.  He was hospitalized for further management.    HOSPITAL COURSE:   Acute Hypoxic Resp. Failure/Pneumonia due to COVID-19 Patient with significant respiratory symptoms and hypoxia.  He was hospitalized for further management.  He was placed on remdesivir and steroids.  He had high inflammatory markers.  He was given convalescent plasma.  Started improving.  He did not require Actemra or baricitinib.  Inflammatory markers improved.  Symptomatically started getting better.  He started ambulating with physical therapy.  He will need home oxygen however he is much better.  He has completed course of remdesivir.  Okay for discharge  today.  Hyponatremia Resolved.  Hypokalemia Repleted.  Leukopenia Due to COVID-19.  Resolved.  History of gout Continue allopurinol  History of BPH Continue finasteride.  History of BPPV Antivert as needed.  Obesity Estimated body mass index is 35.61 kg/m as calculated from the following:   Height as of this encounter: 5\' 11"  (1.803 m).   Weight as of this encounter: 115.8 kg.  Overall stable.  Okay for discharge home today.  Home oxygen to be arranged prior to discharge.    PERTINENT LABS:  The results of significant diagnostics from this hospitalization (including imaging, microbiology, ancillary and laboratory) are listed below for reference.    Microbiology: Recent Results (from the past 240 hour(s))  Blood Culture (routine x 2)     Status: None   Collection Time: 08/14/19  1:07 PM   Specimen: BLOOD  Result Value Ref Range Status   Specimen Description   Final    BLOOD LEFT ANTECUBITAL Performed at [facility], [address] [place], [place]    Special Requests   Final    BOTTLES DRAWN AEROBIC AND ANAEROBIC Blood Culture adequate volume Performed at [facility], [address] [place], [place]    Culture   Final    NO GROWTH 5 DAYS Performed at [facility], [address] [place], [place]    Report Status 08/19/2019 FINAL  Final  Blood Culture (routine x 2)     Status: None   Collection Time: 08/14/19  1:25 PM   Specimen: BLOOD  Result Value Ref Range Status   Specimen Description   Final    BLOOD RIGHT ANTECUBITAL Performed at [facility], [address] [place], [place]    Special Requests   Final  BOTTLES DRAWN AEROBIC AND ANAEROBIC Blood Culture adequate volume Performed at [facility], [address] [place], [place] [address]    Culture   Final    NO GROWTH 5 DAYS Performed at [facility], [address], [place], [place] [address]    Report Status 08/19/2019 FINAL  Final     Labs:  COVID-19 Labs  Recent Labs    08/17/19 0340 08/18/19 0400  DDIMER 0.77* 0.86*  FERRITIN 1,127* 1,157*  CRP 3.4* 1.9*     Basic Metabolic Panel: Recent Labs  Lab 08/14/19 1242 08/15/19 0044 08/16/19 0214 08/17/19 0340 08/18/19 0400  NA 130* 135 137 140 139  K 3.2* 3.9 4.1 4.3 4.3  CL 95* 101 101 108 106  CO2 23 23 19* 22 22  GLUCOSE 119* 128* 153* 117* 109*  BUN 17 19 27* 25* 28*  CREATININE 1.14 1.06 0.84 0.81 0.83  CALCIUM 8.1* 8.2* 8.2* 8.4* 8.6*  MG  --  1.9 2.1 2.3 2.3   Liver Function Tests: Recent Labs  Lab 08/14/19 1242 08/15/19 0044 08/16/19 0214 08/17/19 0340 08/18/19 0400  AST 55* 49* 44* 39 44*  ALT 43 39 38 40 52*  ALKPHOS 38 36* 41 42 44  BILITOT 1.2 1.1 0.8 0.9 0.7  PROT 7.4 7.2 7.3 6.9 6.8  ALBUMIN 3.5 3.1* 3.3* 3.0* 3.0*   CBC: Recent Labs  Lab 08/14/19 1242 08/15/19 0044 08/16/19 0214 08/17/19 0340 08/18/19 0400  WBC 4.8 3.9* 7.3 8.7 10.5  NEUTROABS 3.6 3.3 6.3 7.3 8.4*  HGB 13.5 13.3 14.2 14.0 14.0  HCT 40.0 39.4 42.6 41.7 41.5  MCV 89.1 88.3 88.8 89.9 89.1  PLT 145* 162 213 236 275     IMAGING STUDIES DG Chest Port 1 View  Result Date: 08/14/2019 CLINICAL DATA:  COVID-19 positive yesterday, short of breath, body aches, diarrhea EXAM: PORTABLE CHEST 1 VIEW COMPARISON:  09/21/2017 FINDINGS: Single frontal view of the chest demonstrates multifocal bilateral interstitial and ground-glass opacities greatest in the right perihilar and left infrahilar regions. Overall, findings are consistent with COVID-19 pneumonia. No effusion or pneumothorax. Cardiac silhouette is unremarkable. IMPRESSION: Findings consistent with COVID-19 pneumonia. Electronically Signed   By: Sharlet Salina M.D.   On: 08/14/2019 13:39    DISCHARGE EXAMINATION: Vitals:   08/18/19 1700 08/18/19 1946 08/19/19 0410 08/19/19 0726  BP: 133/88 (!) 147/83 (!) 139/97 (!)  142/87  Pulse:  (!) 53 61 (!) 53  Resp: 20 20 18 20   Temp: 97.7 F (36.5 C) 97.8 F (36.6 C) (!) 97.5 F (36.4 C) 97.8 F (36.6 C)  TempSrc: Oral Oral Oral Oral  SpO2:  93% 91% 96%  Weight:      Height:       General appearance: Awake alert.  In no distress Resp: Improved effort.  Few crackles bilateral bases.  No wheezing or rhonchi. Cardio: S1-S2 is normal regular.  No S3-S4.  No rubs murmurs or bruit GI: Abdomen is soft.  Nontender nondistended.  Bowel sounds are present normal.  No masses organomegaly Extremities: No edema.  Full range of motion of lower extremities. Neurologic: Alert and oriented x3.  No focal neurological deficits.    DISPOSITION: Home  Discharge Instructions    Ambulatory referral to Pulmonology   Complete by: As directed    Follow-up for COVID-19.  In 3 to 4 weeks.  Home oxygen.   Call MD for:  difficulty breathing, headache or visual disturbances   Complete by: As directed  Call MD for:  extreme fatigue   Complete by: As directed    Call MD for:  persistant dizziness or light-headedness   Complete by: As directed    Call MD for:  persistant nausea and vomiting   Complete by: As directed    Call MD for:  severe uncontrolled pain   Complete by: As directed    Call MD for:  temperature >100.4   Complete by: As directed    Discharge instructions   Complete by: As directed    Please take your medications as prescribed.  Follow-up with your primary care provider.  COVID 19 INSTRUCTIONS  - You are felt to be stable enough to no longer require inpatient monitoring, testing, and treatment, though you will need to follow the recommendations below: - Based on the CDC's non-test criteria for ending self-isolation: You may not return to work/leave the home until at least 21 days since symptom onset AND 3 days without a fever (without taking tylenol, ibuprofen, etc.) AND have improvement in respiratory symptoms. - Do not take NSAID medications (including,  but not limited to, ibuprofen, advil, motrin, naproxen, aleve, goody's powder, etc.) - Follow up with your doctor in the next week via telehealth or seek medical attention right away if your symptoms get WORSE.  - Consider donating plasma after you have recovered (either 14 days after a negative test or 28 days after symptoms have completely resolved) because your antibodies to this virus may be helpful to give to others with life-threatening infections. Please go to the website www.oneblood.org if you would like to consider volunteering for plasma donation.    Directions for you at home:  Wear a facemask You should wear a facemask that covers your nose and mouth when you are in the same room with other people and when you visit a healthcare provider. People who live with or visit you should also wear a facemask while they are in the same room with you.  Separate yourself from other people in your home As much as possible, you should stay in a different room from other people in your home. Also, you should use a separate bathroom, if available.  Avoid sharing household items You should not share dishes, drinking glasses, cups, eating utensils, towels, bedding, or other items with other people in your home. After using these items, you should wash them thoroughly with soap and water.  Cover your coughs and sneezes Cover your mouth and nose with a tissue when you cough or sneeze, or you can cough or sneeze into your sleeve. Throw used tissues in a lined trash can, and immediately wash your hands with soap and water for at least 20 seconds or use an alcohol-based hand rub.  Wash your [employer] your hands often and thoroughly with soap and water for at least 20 seconds. You can use an alcohol-based hand sanitizer if soap and water are not available and if your hands are not visibly dirty. Avoid touching your eyes, nose, and mouth with unwashed hands.  Directions for those who live with, or  provide care at home for you:  Limit the number of people who have contact with the patient If possible, have only one caregiver for the patient. Other household members should stay in another home or place of residence. If this is not possible, they should stay in another room, or be separated from the patient as much as possible. Use a separate bathroom, if available. Restrict visitors who do not have  an essential need to be in the home.  Ensure good ventilation Make sure that shared spaces in the home have good air flow, such as from an air conditioner or an opened window, weather permitting.  Wash your hands often Wash your hands often and thoroughly with soap and water for at least 20 seconds. You can use an alcohol based hand sanitizer if soap and water are not available and if your hands are not visibly dirty. Avoid touching your eyes, nose, and mouth with unwashed hands. Use disposable paper towels to dry your hands. If not available, use dedicated cloth towels and replace them when they become wet.  Wear a facemask and gloves Wear a disposable facemask at all times in the room and gloves when you touch or have contact with the patient's blood, body fluids, and/or secretions or excretions, such as sweat, saliva, sputum, nasal mucus, vomit, urine, or feces.  Ensure the mask fits over your nose and mouth tightly, and do not touch it during use. Throw out disposable facemasks and gloves after using them. Do not reuse. Wash your hands immediately after removing your facemask and gloves. If your personal clothing becomes contaminated, carefully remove clothing and launder. Wash your hands after handling contaminated clothing. Place all used disposable facemasks, gloves, and other waste in a lined container before disposing them with other household waste. Remove gloves and wash your hands immediately after handling these items.  Do not share dishes, glasses, or other household items with  the patient Avoid sharing household items. You should not share dishes, drinking glasses, cups, eating utensils, towels, bedding, or other items with a patient who is confirmed to have, or being evaluated for, COVID-19 infection. After the person uses these items, you should wash them thoroughly with soap and water.  Wash laundry thoroughly Immediately remove and wash clothes or bedding that have blood, body fluids, and/or secretions or excretions, such as sweat, saliva, sputum, nasal mucus, vomit, urine, or feces, on them. Wear gloves when handling laundry from the patient. Read and follow directions on labels of laundry or clothing items and detergent. In general, wash and dry with the warmest temperatures recommended on the label.  Clean all areas the individual has used often Clean all touchable surfaces, such as counters, tabletops, doorknobs, bathroom fixtures, toilets, phones, keyboards, tablets, and bedside tables, every day. Also, clean any surfaces that may have blood, body fluids, and/or secretions or excretions on them. Wear gloves when cleaning surfaces the patient has come in contact with. Use a diluted bleach solution (e.g., dilute bleach with 1 part bleach and 10 parts water) or a household disinfectant with a label that says EPA-registered for coronaviruses. To make a bleach solution at home, add 1 tablespoon of bleach to 1 quart (4 cups) of water. For a larger supply, add  cup of bleach to 1 gallon (16 cups) of water. Read labels of cleaning products and follow recommendations provided on product labels. Labels contain instructions for safe and effective use of the cleaning product including precautions you should take when applying the product, such as wearing gloves or eye protection and making sure you have good ventilation during use of the product. Remove gloves and wash hands immediately after cleaning.  Monitor yourself for signs and symptoms of illness Caregivers and  household members are considered close contacts, should monitor their health, and will be asked to limit movement outside of the home to the extent possible. Follow the monitoring steps for close contacts listed on  the symptom monitoring form.   If you have additional questions, contact your local health department or call the epidemiologist on call at [phone] (available 24/7). This guidance is subject to change. For the most up-to-date guidance from [facility], please refer to their website: YouBlogs.pl   You were cared for by a hospitalist during your hospital stay. If you have any questions about your discharge medications or the care you received while you were in the hospital after you are discharged, you can call the unit and asked to speak with the hospitalist on call if the hospitalist that took care of you is not available. Once you are discharged, your primary care physician will handle any further medical issues. Please note that NO REFILLS for any discharge medications will be authorized once you are discharged, as it is imperative that you return to your primary care physician (or establish a relationship with a primary care physician if you do not have one) for your aftercare needs so that they can reassess your need for medications and monitor your lab values. If you do not have a primary care physician, you can call [phone] for a physician referral.   Increase activity slowly   Complete by: As directed         Allergies as of 08/19/2019   No Known Allergies     Medication List    STOP taking these medications   colchicine 0.6 MG tablet   HYDROcodone-acetaminophen 5-325 MG tablet Commonly known as: NORCO/VICODIN   indomethacin 50 MG capsule Commonly known as: INDOCIN   LORazepam 1 MG tablet Commonly known as: Ativan   meclizine 25 MG tablet Commonly known as: ANTIVERT   methocarbamol 500 MG  tablet Commonly known as: ROBAXIN     TAKE these medications   allopurinol 300 MG tablet Commonly known as: ZYLOPRIM Take 300 mg by mouth daily.   amLODipine 10 MG tablet Commonly known as: NORVASC Take 1 tablet (10 mg total) by mouth daily.   ascorbic acid 500 MG tablet Commonly known as: VITAMIN C Take 1 tablet (500 mg total) by mouth daily for 10 days.   cetirizine 10 MG tablet Commonly known as: ZYRTEC Take 10 mg by mouth daily as needed.   dexamethasone 2 MG tablet Commonly known as: DECADRON Take 2 tablets once daily for 3 days, then 1 tablet once daily for 3 days, then STOP.   famotidine 20 MG tablet Commonly known as: PEPCID Take 1 tablet (20 mg total) by mouth daily for 10 days.   finasteride 5 MG tablet Commonly known as: PROSCAR Take 1 tablet (5 mg total) by mouth daily.   hydrOXYzine 10 MG tablet Commonly known as: ATARAX/VISTARIL Take 10 mg by mouth 2 (two) times daily.   lisinopril 40 MG tablet Commonly known as: ZESTRIL Take 1 tablet (40 mg total) by mouth daily.   rosuvastatin 20 MG tablet Commonly known as: CRESTOR Take 20 mg by mouth daily.   traMADol 50 MG tablet Commonly known as: ULTRAM Take 50 mg by mouth 2 (two) times daily as needed for moderate pain.   zinc sulfate 220 (50 Zn) MG capsule Take 1 capsule (220 mg total) by mouth daily for 10 days.            Durable Medical Equipment  (From admission, onward)         Start     Ordered   08/19/19 1015  For home use only DME 3 n 1  Once  08/19/19 1014   08/18/19 1157  For home use only DME oxygen  Once    Question Answer Comment  Length of Need 6 Months   Mode or (Route) Nasal cannula   Liters per Minute 2   Frequency Continuous (stationary and portable oxygen unit needed)   Oxygen conserving device Yes   Oxygen delivery system Gas      08/18/19 1157           Follow-up Information    Achreja, Youlanda MightyManjeet Kaur, MD. Schedule an appointment as soon as possible for a  visit in 2 week(s).   Specialty: Family Medicine Contact information: [address] [place] [address] [phone]        [facility] Follow up.   Why: agency will provide home oxygen Contact information: [address] [place] [address] [phone]           TOTAL DISCHARGE TIME: 35 minutes  Garcia Dalzell Rito EhrlichKrishnan  [facility] Pager on [email or web address]  08/19/2019, 1:04 PM

## 2019-08-19 NOTE — Progress Notes (Signed)
1630Discharged pt to care of Linsi his daughter.  Pt verbalized understanding of his discharge instructions and medication changes  IV removed cannula in tact.

## 2019-08-19 NOTE — Progress Notes (Signed)
Spoke to pt daughter Arnoldo Morale.  She is aware of pt pending discharge  Pt ambulated in the hallway, No SOB or CP reported.  Per Case Management we are waiting on Oxygen to be delivered and pt can be discharged

## 2019-08-19 NOTE — TOC Progression Note (Signed)
Transition of Care [facility]) - Progression Note    Patient Details  Name: Ricky Bailey MRN: [id number] Date of Birth: 1956-03-30  [facility] CM/SW Contact  Armanda Heritage, RN Phone Number: 08/19/2019, 12:10 PM  Clinical Narrative:    Patient set up with [facility] for home oxygen. [facility] to deliver concentrator to home, [facility] to delivery portable tank to bedside.     Expected Discharge Plan: Home/Self Care Barriers to Discharge: No Barriers Identified  Expected Discharge Plan and Services Expected Discharge Plan: Home/Self Care   Discharge Planning Services: CM Consult Post Acute Care Choice: Durable Medical Equipment Living arrangements for the past 2 months: Single Family Home Expected Discharge Date: 08/19/19               DME Arranged: Oxygen DME Agency: Patsy Lager Date DME Agency Contacted: 08/19/19 Time DME Agency Contacted: 1210 Representative spoke with at DME Agency: Morrie Sheldon stocks HH Arranged: NA HH Agency: NA         Social Determinants of Health (SDOH) Interventions    Readmission Risk Interventions No flowsheet data found.

## 2019-08-19 NOTE — Progress Notes (Signed)
Occupational Therapy Treatment Patient Details Name: Ricky Bailey MRN: [id number] DOB: 06/21/1956 Today's Date: 08/19/2019    History of present illness Pt is a 64 y.o. male dx with COVID-19 on 08/11/19, admitted 08/14/19 with fever, cough and worsening SOB. Worked up for acute respiratory failure with hypoxia secondary to COVID PNA. PMH includes HTN, HLD, BPH.   OT comments  Pt making progress in therapy, demonstrating improved activity tolerance and independence with self-care and mobility tasks. Pt able to ambulate to/from bathroom with supervision and without an assistive device. Noted 0 instances of loss of balance. Pt completed toileting task with use of grab bar for support. Pt tolerated standing 1 x 13 min at the sink to complete grooming and sponge bathing tasks. SpO2 dropped to 89% on room air during activity with quick return back to 90s following seated rest break. Pt reported min to mod shortness of breath during activity. Pt demo good recall and follow through of pursed lip breathing technique. Continued education/instruction with pt on energy conservation strategies with good understanding. OT will continue to follow acutely.    Follow Up Recommendations  No OT follow up;Supervision - Intermittent    Equipment Recommendations  3 in 1 bedside commode(for use in shower)    Recommendations for Other Services      Precautions / Restrictions Precautions Precautions: Other (comment) Precaution Comments: Monitor SpO2 Restrictions Weight Bearing Restrictions: No       Mobility Bed Mobility Overal bed mobility: Modified Independent             General bed mobility comments: HOB elevated, use of bedrail  Transfers Overall transfer level: Needs assistance Equipment used: None Transfers: Sit to/from Stand;Stand Pivot Transfers Sit to Stand: Supervision Stand pivot transfers: Supervision       General transfer comment: to ensure balance and safety    Balance Overall  balance assessment: Mild deficits observed, not formally tested                                         ADL either performed or assessed with clinical judgement   ADL Overall ADL's : Needs assistance/impaired     Grooming: Set up;Standing   Upper Body Bathing: Set up;Standing   Lower Body Bathing: Set up;Sit to/from stand   Upper Body Dressing : Independent;Sitting       Toilet Transfer: Supervision/safety;Ambulation;Regular Toilet;Grab bars   Toileting- Clothing Manipulation and Hygiene: Supervision/safety;Sit to/from stand       Functional mobility during ADLs: Supervision/safety General ADL Comments: Pt able to ambulate to/from bathroom without an assistive device. Noted 0 instances of LOB.      Vision       Perception     Praxis      Cognition Arousal/Alertness: Awake/alert Behavior During Therapy: WFL for tasks assessed/performed;Flat affect Overall Cognitive Status: Within Functional Limits for tasks assessed                                          Exercises     Shoulder Instructions       General Comments Pt on 1L [place] with SpO2 96% at rest. SpO2 dropped to 89% on room air during activity with quick return once seated. Pt demo good use of pursed lip breathing strategies.    Pertinent Vitals/ Pain  Pain Assessment: No/denies pain  Home Living                                          Prior Functioning/Environment              Frequency           Progress Toward Goals  OT Goals(current goals can now be found in the care plan section)  Progress towards OT goals: Progressing toward goals  ADL Goals Additional ADL Goal #1: Pt to complete ADLs independently with SpO2 maintaining in 90s on room air. Additional ADL Goal #2: Pt to tolerate standing up to 10 min independently, in preparation for ADLs. Additional ADL Goal #3: Pt to recall and verbalize 3 energy conservation strategies  with 0 verbal cues. Additional ADL Goal #4: Pt to recall and demonstrate breathing exercises with 0 verbal cues on technique.  Plan Discharge plan remains appropriate    Co-evaluation                 AM-PAC OT "6 Clicks" Daily Activity     Outcome Measure   Help from another person eating meals?: None Help from another person taking care of personal grooming?: A Little Help from another person toileting, which includes using toliet, bedpan, or urinal?: A Little Help from another person bathing (including washing, rinsing, drying)?: A Little Help from another person to put on and taking off regular upper body clothing?: None Help from another person to put on and taking off regular lower body clothing?: A Little 6 Click Score: 20    End of Session    OT Visit Diagnosis: Unsteadiness on feet (R26.81);Muscle weakness (generalized) (M62.81)   Activity Tolerance Patient tolerated treatment well   Patient Left in chair;with call bell/phone within reach   Nurse Communication Mobility status        Time: 0917-0950 OT Time Calculation (min): 33 min  Charges: OT General Charges $OT Visit: 1 Visit OT Treatments $Self Care/Home Management : 8-22 mins $Therapeutic Activity: 8-22 mins  Peterson Ao OTR/L [phone]   Peterson Ao 08/19/2019, 9:54 AM

## 2019-09-14 IMAGING — DX DG CHEST 2V
2 series · 2 of 2 positions shown · non-contrast
Comparison: Radiographs June 19, 2012.

CLINICAL DATA: Chest pain.

EXAM:
CHEST - 2 VIEW

[chest pa]
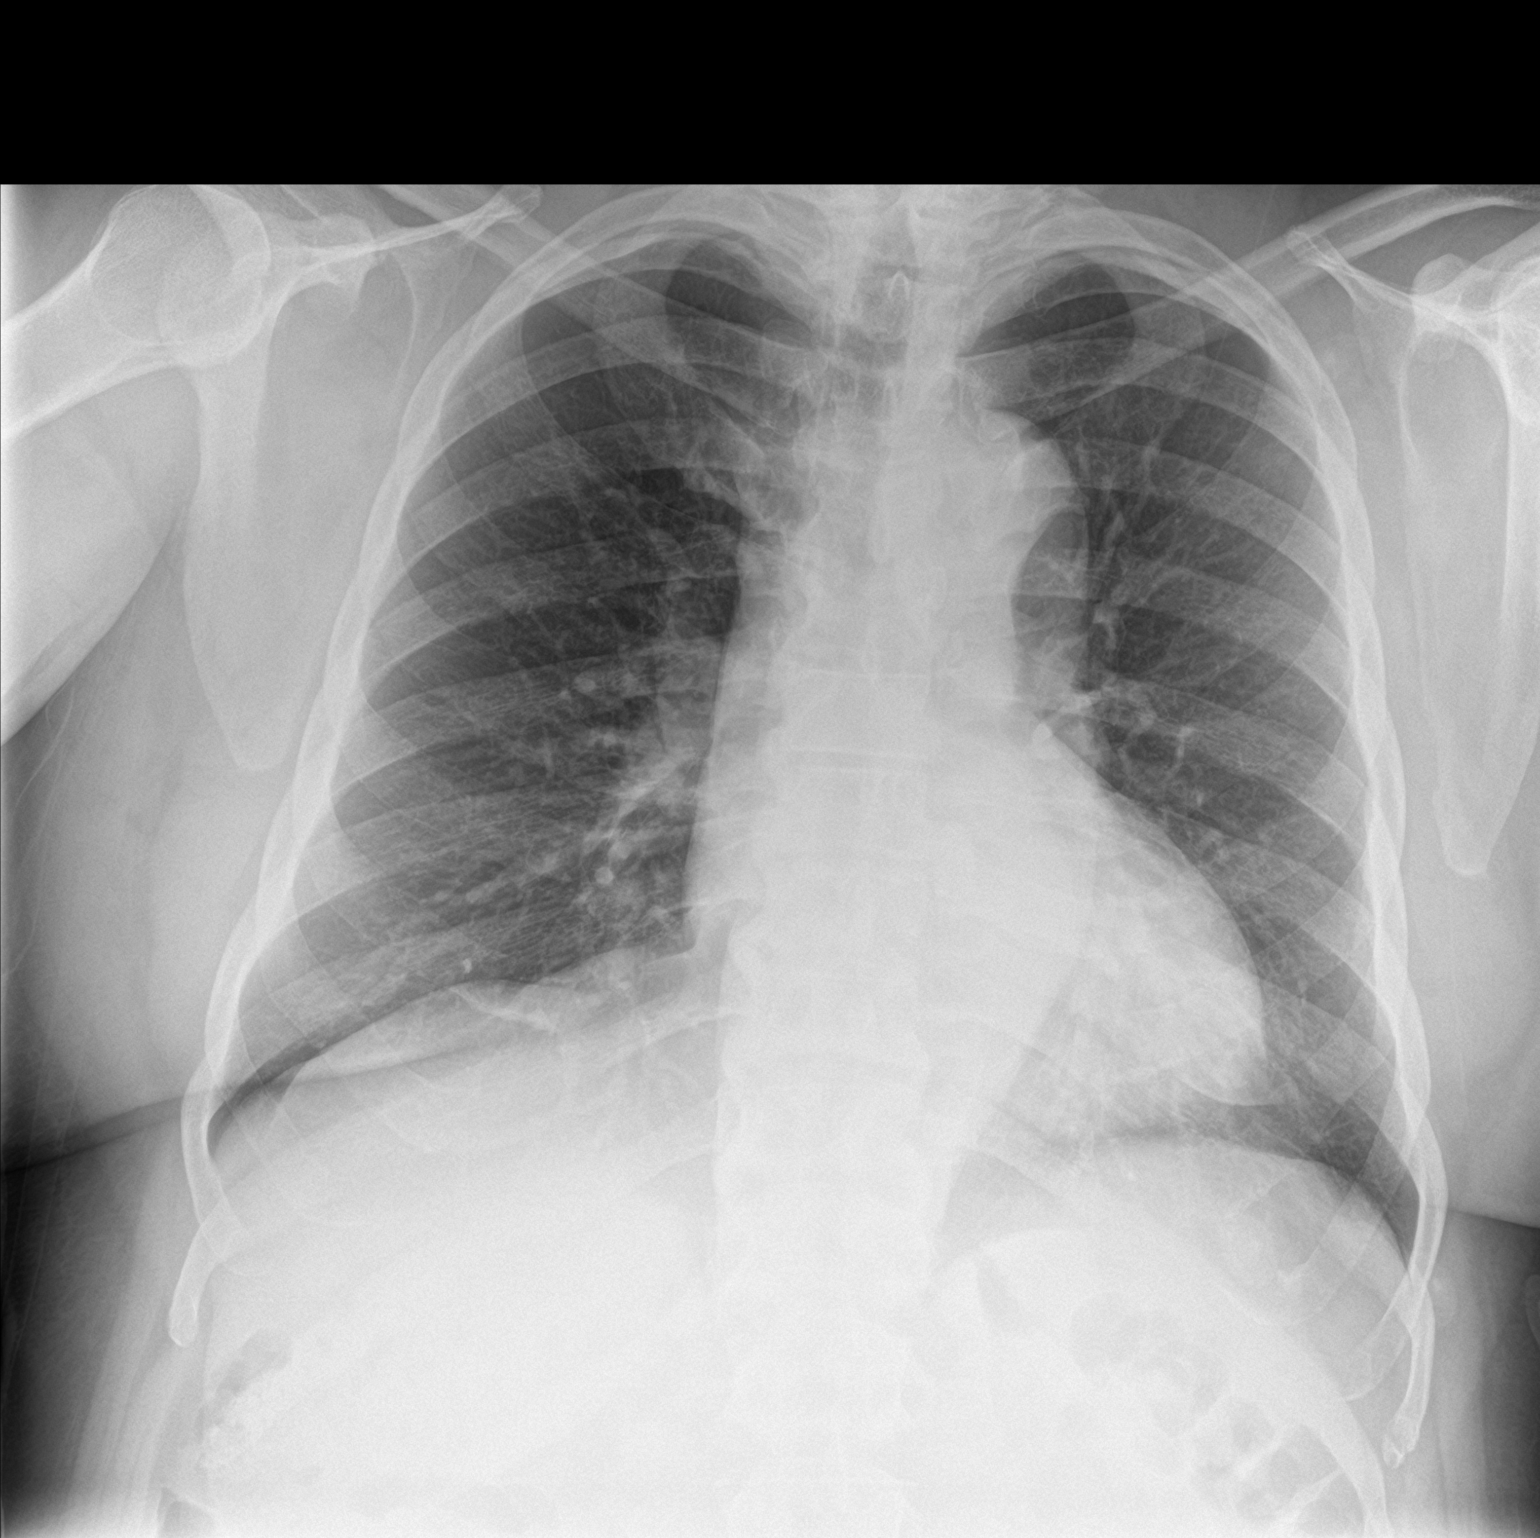

[chest lat]
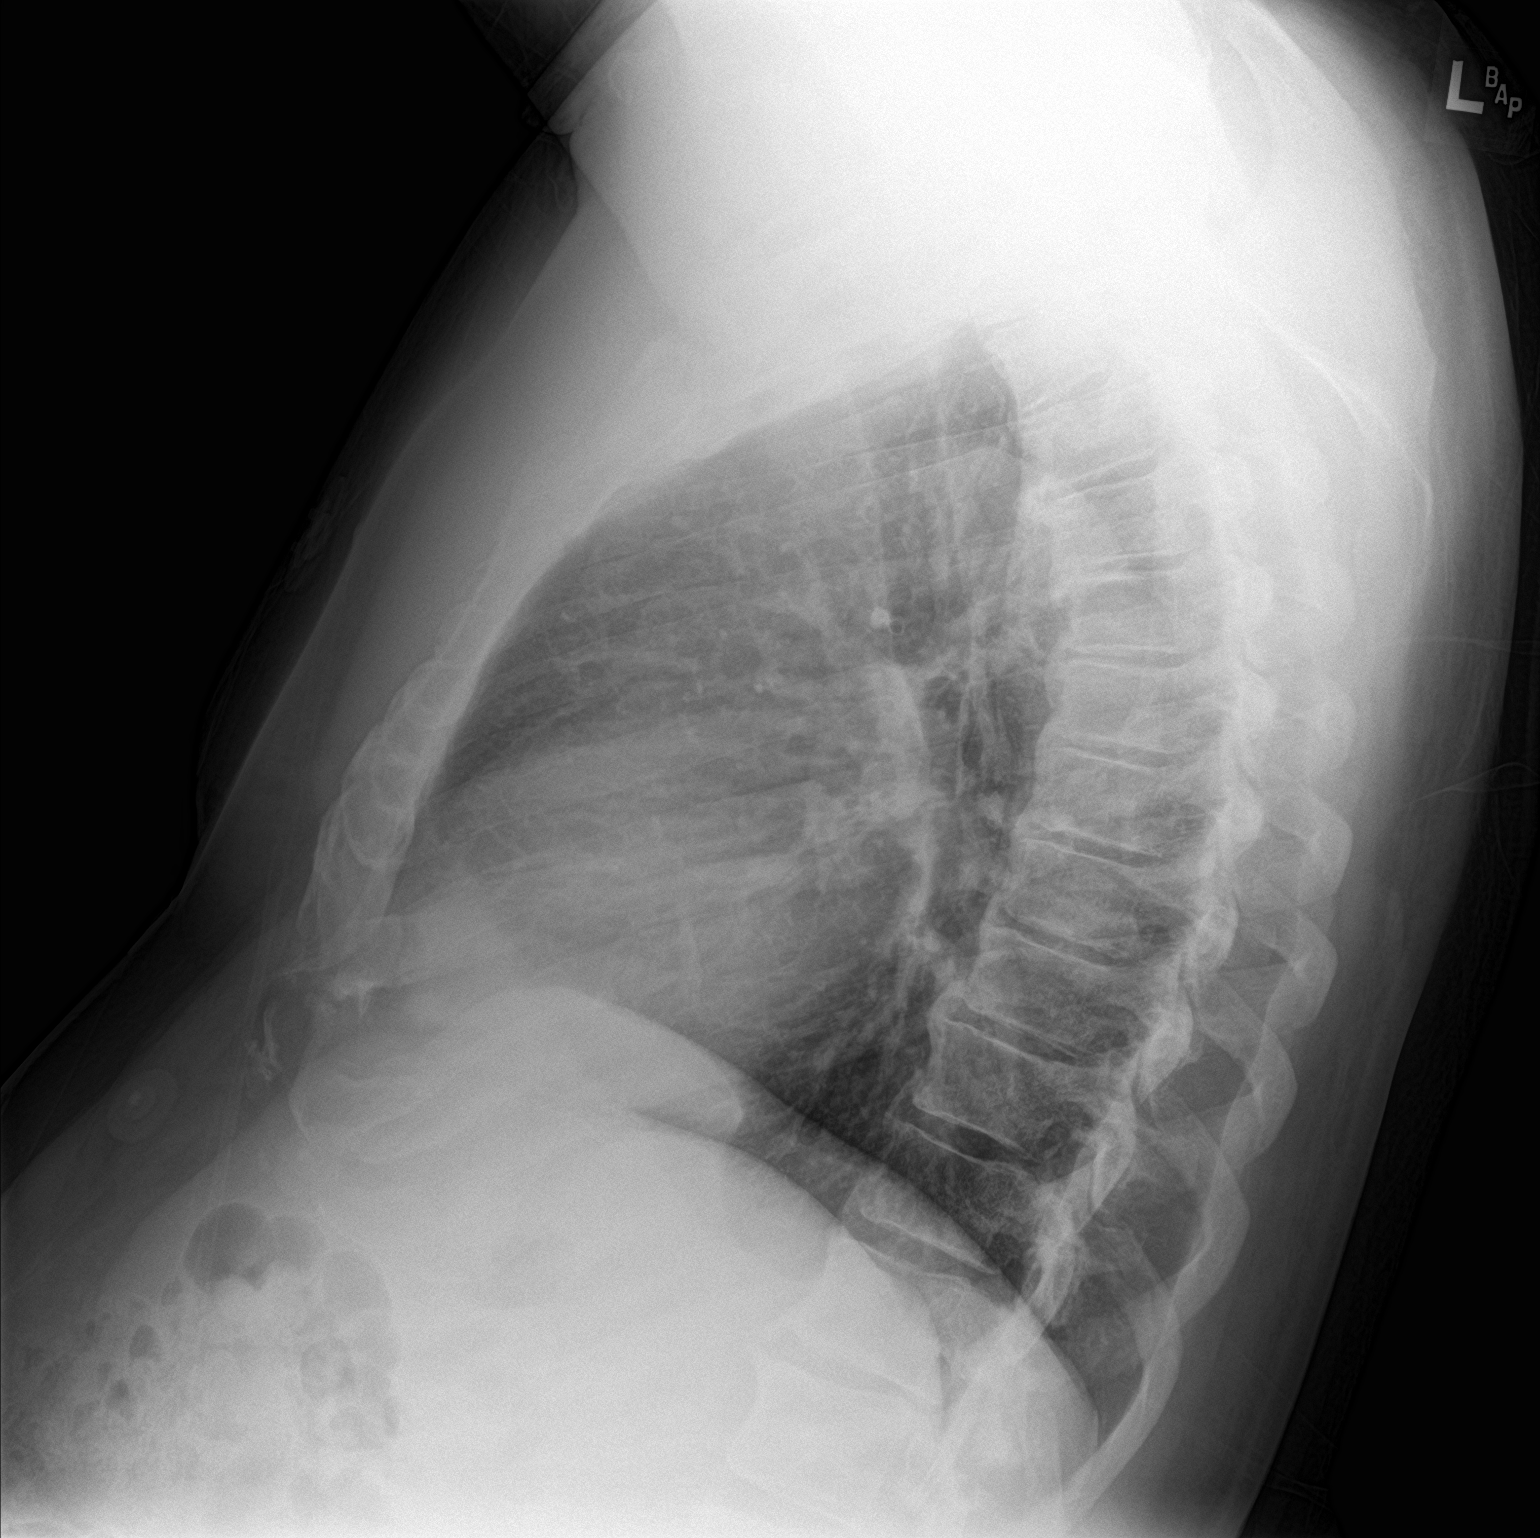

[2 of 2 positions shown; findings below may reference images not displayed]

FINDINGS: The heart size and mediastinal contours are within normal limits.
Both lungs are clear. No pneumothorax or pleural effusion is noted.
The visualized skeletal structures are unremarkable.
IMPRESSION: No active cardiopulmonary disease.

## 2019-09-23 ENCOUNTER — Other Ambulatory Visit: Payer: Self-pay

## 2020-11-07 ENCOUNTER — Other Ambulatory Visit: Payer: Self-pay

## 2020-11-07 ENCOUNTER — Emergency Department (HOSPITAL_COMMUNITY)
Admission: EM | Admit: 2020-11-07 | Discharge: 2020-11-07 | Disposition: A | Discharge status: Home / Self Care | Attending: Emergency Medicine | Admitting: Emergency Medicine

## 2020-11-07 ENCOUNTER — Encounter (HOSPITAL_COMMUNITY): Payer: Self-pay | Admitting: Emergency Medicine

## 2020-11-07 ENCOUNTER — Emergency Department (HOSPITAL_COMMUNITY)

## 2020-11-07 DIAGNOSIS — Z87891 Personal history of nicotine dependence: Secondary | ICD-10-CM | POA: Insufficient documentation

## 2020-11-07 DIAGNOSIS — G8929 Other chronic pain: Secondary | ICD-10-CM | POA: Diagnosis not present

## 2020-11-07 DIAGNOSIS — Z8616 Personal history of COVID-19: Secondary | ICD-10-CM | POA: Insufficient documentation

## 2020-11-07 DIAGNOSIS — I10 Essential (primary) hypertension: Secondary | ICD-10-CM | POA: Insufficient documentation

## 2020-11-07 DIAGNOSIS — M79605 Pain in left leg: Secondary | ICD-10-CM | POA: Insufficient documentation

## 2020-11-07 DIAGNOSIS — Z79899 Other long term (current) drug therapy: Secondary | ICD-10-CM | POA: Diagnosis not present

## 2020-11-07 DIAGNOSIS — M545 Low back pain, unspecified: Secondary | ICD-10-CM | POA: Insufficient documentation

## 2020-11-07 DIAGNOSIS — M5432 Sciatica, left side: Secondary | ICD-10-CM

## 2020-11-07 HISTORY — DX: Dorsalgia, unspecified: M54.9

## 2020-11-07 MED ORDER — OXYCODONE HCL 5 MG PO TABS: 2.5000 mg | ORAL_TABLET | Freq: Four times a day (QID) | ORAL | 0 refills | Status: DC | PRN

## 2020-11-07 MED ORDER — METHYLPREDNISOLONE 4 MG PO TBPK: ORAL_TABLET | ORAL | 0 refills | Status: DC

## 2020-11-07 MED ORDER — METHOCARBAMOL 500 MG PO TABS: 500.0000 mg | ORAL_TABLET | Freq: Two times a day (BID) | ORAL | 0 refills | Status: DC

## 2020-11-07 MED ORDER — MORPHINE SULFATE (PF) 4 MG/ML IV SOLN
4.0000 mg | Freq: Once | INTRAVENOUS | Status: AC
  Administered 2020-11-07: 4 mg via INTRAVENOUS
  Filled 2020-11-07: qty 1

## 2020-11-07 MED ORDER — KETOROLAC TROMETHAMINE 30 MG/ML IJ SOLN
15.0000 mg | Freq: Once | INTRAMUSCULAR | Status: AC
  Administered 2020-11-07: 15 mg via INTRAVENOUS
  Filled 2020-11-07: qty 1

## 2020-11-07 MED ORDER — DEXAMETHASONE SODIUM PHOSPHATE 10 MG/ML IJ SOLN
10.0000 mg | Freq: Once | INTRAMUSCULAR | Status: AC
  Administered 2020-11-07: 10 mg via INTRAVENOUS
  Filled 2020-11-07: qty 1

## 2020-11-07 NOTE — Discharge Instructions (Addendum)
SEEK IMMEDIATE MEDICAL ATTENTION IF: New numbness, tingling, weakness, or problem with the use of your arms or legs.  Severe back pain not relieved with medications.  Change in bowel or bladder control.  Increasing pain in any areas of the body (such as chest or abdominal pain).  Shortness of breath, dizziness or fainting.  Nausea (feeling sick to your stomach), vomiting, fever, or sweats.  

## 2020-11-07 NOTE — ED Notes (Signed)
ED RN reviewed discharge instructions w/ pt. Prescriptions and follow up care reviewed. Pt had no further questions

## 2020-11-07 NOTE — ED Triage Notes (Signed)
Pt to triage via [facility] from [facility].  Reports back pain x 3 days with radiation down L leg.  History of back surgery for bulging disc in 2017.  Called PCP on Friday and started Tramadol yesterday.  States Tramadol seemed to help yesterday but took it this morning without relief.

## 2020-11-07 NOTE — ED Provider Notes (Signed)
[facility] EMERGENCY DEPARTMENT Provider Note   CSN: [id number] Arrival date & time: 11/07/20  1011     History Chief Complaint  Patient presents with  . Back Pain    Ricky Bailey is a 65 y.o. male.  With a past medical history of chronic back pain and previous surgeries to his back who presents emergency department with chief complaint of low back pain and left leg pain.  Patient states that he has had this once before and ended up having surgery on his back back in 2017.  He states that he has intermittent low-grade back pain which is usually tolerable with Tylenol or Motrin if it gets irritated.  He states that on Wednesday he began having pain in his low back, left buttock and radiating down the back of his leg.  It has progressively worsened.  He called his primary care doctor 2 days ago who prescribed him meloxicam and tramadol.  He states that yesterday it helped some with his pain and he was able to get to sleep however this morning he took it and not only did not help his pain but his pain has gotten much worse.  He states that his pain is 10 out of 10 at rest and gets worse if he tries to change position.  He states that he has not had pain like this since before he had his surgery.  He denies any recent injuries, twisting, heavy lifting.  He denies saddle anesthesia, bowel or bladder incontinence, weakness of the lower extremities.  HPI     Past Medical History:  Diagnosis Date  . Back pain   . BPH (benign prostatic hyperplasia)   . Gout   . Hyperlipidemia   . Hypertension   . Obesity   . Vertigo 03/28/2018   with vommiting    Patient Active Problem List   Diagnosis Date Noted  . Acute respiratory failure due to COVID-19 (HCC) 08/14/2019  . Pneumonia due to COVID-19 virus 08/14/2019  . Hyponatremia 08/14/2019  . Hypokalemia 08/14/2019  . Hypercholesteremia 08/14/2019  . BPH (benign prostatic hyperplasia) 08/14/2019  . Acute respiratory failure  with hypoxia (HCC) 08/14/2019  . 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 BPPV (benign paroxysmal positional vertigo) 03/28/2018  . Vomiting 03/28/2018  . Benign essential HTN 03/28/2018  . Obesity (BMI 30-39.9) 03/28/2018    Past Surgical History:  Procedure Laterality Date  . BACK SURGERY         Family History  Problem Relation Age of Onset  . Heart disease Mother   . Lung cancer Father     Social History   Tobacco Use  . Smoking status: Former Smoker    Quit date: 07/21/1977    Years since quitting: 43.3  . Smokeless tobacco: Never Used  Vaping Use  . Vaping Use: Never used  Substance Use Topics  . Alcohol use: Yes    Alcohol/week: 6.0 standard drinks    Types: 6 Cans of beer per week  . Drug use: No    Home Medications Prior to Admission medications   Medication Sig Start Date End Date Taking? Authorizing Provider  allopurinol (ZYLOPRIM) 300 MG tablet Take 300 mg by mouth daily. 03/07/18   [provider]  amLODipine (NORVASC) 10 MG tablet Take 1 tablet (10 mg total) by mouth daily. 03/31/18   Lahoma Crocker, MD  cetirizine (ZYRTEC) 10 MG tablet Take 10 mg by mouth daily as needed.  03/21/18   [provider]  dexamethasone (DECADRON) 2 MG tablet Take 2 tablets once daily for 3 days, then 1 tablet once daily for 3 days, then STOP. 08/19/19   Osvaldo Shipper, MD  famotidine (PEPCID) 20 MG tablet Take 1 tablet (20 mg total) by mouth daily for 10 days. 08/19/19 08/29/19  Osvaldo Shipper, MD  finasteride (PROSCAR) 5 MG tablet Take 1 tablet (5 mg total) by mouth daily. 03/31/18   Lahoma Crocker, MD  hydrOXYzine (ATARAX/VISTARIL) 10 MG tablet Take 10 mg by mouth 2 (two) times daily. 03/21/18   [provider]  lisinopril (PRINIVIL,ZESTRIL) 40 MG tablet Take 1 tablet (40 mg total) by mouth daily. 03/31/18   Lahoma Crocker, MD  rosuvastatin (CRESTOR) 20 MG tablet Take 20 mg by mouth daily.    [provider]  traMADol (ULTRAM) 50 MG tablet  Take 50 mg by mouth 2 (two) times daily as needed for moderate pain.  03/07/18   [provider]    Allergies    Patient has no known allergies.  Review of Systems   Review of Systems Ten systems reviewed and are negative for acute change, except as noted in the HPI.   Physical Exam Updated Vital Signs BP (!) 148/99 (BP Location: Left Arm)   Pulse 100   Temp 98 F (36.7 C) (Oral)   Resp 20   SpO2 94%   Physical Exam Vitals and nursing note reviewed.  Constitutional:      General: He is not in acute distress.    Appearance: He is well-developed. He is obese. He is not diaphoretic.     Comments: Patient appears very uncomfortable  HENT:     Head: Normocephalic and atraumatic.  Eyes:     General: No scleral icterus.    Conjunctiva/sclera: Conjunctivae normal.  Cardiovascular:     Rate and Rhythm: Normal rate and regular rhythm.     Heart sounds: Normal heart sounds.  Pulmonary:     Effort: Pulmonary effort is normal. No respiratory distress.     Breath sounds: Normal breath sounds.  Abdominal:     Palpations: Abdomen is soft.     Tenderness: There is no abdominal tenderness.  Musculoskeletal:     Cervical back: Normal range of motion and neck supple.     Right lower leg: No edema.     Left lower leg: No edema.     Comments:  No midline tenderness.  +ttp to lumbar paraspinals Left side Normal strength with dorsi and plantar flexion of the left ankle.  Positive straight leg test at about 10 degrees.  Skin:    General: Skin is warm and dry.  Neurological:     Mental Status: He is alert.  Psychiatric:        Behavior: Behavior normal.     ED Results / Procedures / Treatments   Labs (all labs ordered are listed, but only abnormal results are displayed) Labs Reviewed - No data to display  EKG None  Radiology No results found.  Procedures Procedures   Medications Ordered in ED Medications - No data to display  ED Course  I have reviewed the  triage vital signs and the nursing notes.  Pertinent labs & imaging results that were available during my care of the patient were reviewed by me and considered in my medical decision making (see chart for details).    MDM Rules/Calculators/A&P  65 year old male here with acute on chronic back pain issues.  I ordered and reviewed a back x-ray that shows no evidence of hardware abnormality.  He does have degenerative findings.  I ordered medications including morphine, Toradol and Decadron patient is significant improvement in his pain.  I will discharge with pain medications Medrol Dosepak, anti-inflammatories and close follow-up with neurosurgery.     Final Clinical Impression(s) / ED Diagnoses Final diagnoses:  None    Rx / DC Orders ED Discharge Orders    None       Arthor Captain, PA-C 11/07/20 1636    Milagros Loll, MD 11/08/20 [phone]

## 2021-03-12 ENCOUNTER — Encounter (HOSPITAL_COMMUNITY): Payer: Self-pay | Admitting: Emergency Medicine

## 2021-03-12 ENCOUNTER — Other Ambulatory Visit: Payer: Self-pay

## 2021-03-12 ENCOUNTER — Emergency Department (HOSPITAL_COMMUNITY)
Admission: EM | Admit: 2021-03-12 | Discharge: 2021-03-13 | Disposition: A | Discharge status: Home / Self Care | Payer: Medicare Other | Attending: Emergency Medicine | Admitting: Emergency Medicine

## 2021-03-12 DIAGNOSIS — Z8616 Personal history of COVID-19: Secondary | ICD-10-CM | POA: Diagnosis not present

## 2021-03-12 DIAGNOSIS — R109 Unspecified abdominal pain: Secondary | ICD-10-CM | POA: Diagnosis present

## 2021-03-12 DIAGNOSIS — N281 Cyst of kidney, acquired: Secondary | ICD-10-CM | POA: Diagnosis not present

## 2021-03-12 DIAGNOSIS — I10 Essential (primary) hypertension: Secondary | ICD-10-CM | POA: Diagnosis not present

## 2021-03-12 DIAGNOSIS — Z79899 Other long term (current) drug therapy: Secondary | ICD-10-CM | POA: Diagnosis not present

## 2021-03-12 DIAGNOSIS — Z87891 Personal history of nicotine dependence: Secondary | ICD-10-CM | POA: Diagnosis not present

## 2021-03-12 LAB — COMPREHENSIVE METABOLIC PANEL
ALT: 25 U/L (ref 0–44)
AST: 22 U/L (ref 15–41)
Albumin: 3.9 g/dL (ref 3.5–5.0)
Alkaline Phosphatase: 47 U/L (ref 38–126)
Anion gap: 8 (ref 5–15)
BUN: 7 mg/dL — ABNORMAL LOW (ref 8–23)
CO2: 24 mmol/L (ref 22–32)
Calcium: 9.5 mg/dL (ref 8.9–10.3)
Chloride: 106 mmol/L (ref 98–111)
Creatinine, Ser: 0.88 mg/dL (ref 0.61–1.24)
GFR, Estimated: >60 mL/min (ref >60)
Glucose, Bld: 113 mg/dL — ABNORMAL HIGH (ref 70–99)
Potassium: 3.8 mmol/L (ref 3.5–5.1)
Sodium: 138 mmol/L (ref 135–145)
Total Bilirubin: 1.1 mg/dL (ref 0.3–1.2)
Total Protein: 6.7 g/dL (ref 6.5–8.1)

## 2021-03-12 LAB — CBC WITH DIFFERENTIAL/PLATELET
Abs Immature Granulocytes: 0.01 10*3/uL (ref 0.00–0.07)
Basophils Absolute: 0 10*3/uL (ref 0.0–0.1)
Basophils Relative: 0 %
Eosinophils Absolute: 0.2 10*3/uL (ref 0.0–0.5)
Eosinophils Relative: 3 %
HCT: 43.3 % (ref 39.0–52.0)
Hemoglobin: 14.9 g/dL (ref 13.0–17.0)
Immature Granulocytes: 0 %
Lymphocytes Relative: 51 %
Lymphs Abs: 3.4 10*3/uL (ref 0.7–4.0)
MCH: 30.7 pg (ref 26.0–34.0)
MCHC: 34.4 g/dL (ref 30.0–36.0)
MCV: 89.3 fL (ref 80.0–100.0)
Monocytes Absolute: 0.5 10*3/uL (ref 0.1–1.0)
Monocytes Relative: 8 %
Neutro Abs: 2.5 10*3/uL (ref 1.7–7.7)
Neutrophils Relative %: 38 %
Platelets: 224 10*3/uL (ref 150–400)
RBC: 4.85 MIL/uL (ref 4.22–5.81)
RDW: 12.6 % (ref 11.5–15.5)
WBC: 6.6 10*3/uL (ref 4.0–10.5)
nRBC: 0 % (ref 0.0–0.2)

## 2021-03-12 LAB — LIPASE, BLOOD: Lipase: 45 U/L (ref 11–51)

## 2021-03-12 LAB — URINALYSIS, ROUTINE W REFLEX MICROSCOPIC
Bilirubin Urine: NEGATIVE
Glucose, UA: NEGATIVE mg/dL
Hgb urine dipstick: NEGATIVE
Ketones, ur: NEGATIVE mg/dL
Leukocytes,Ua: NEGATIVE
Nitrite: NEGATIVE
Protein, ur: NEGATIVE mg/dL
Specific Gravity, Urine: 1.01 (ref 1.005–1.030)
pH: 7 (ref 5.0–8.0)

## 2021-03-12 NOTE — ED Provider Notes (Signed)
Emergency Medicine Provider Triage Evaluation Note  Ricky Bailey , a 65 y.o. male  was evaluated in triage.  Pt complains of L flank pain.  Review of Systems  Positive: L flank Negative: Fever, chills, n/v/d, dysuria, hematuria, cp, sob  Physical Exam  BP (!) 135/91 (BP Location: Right Arm)   Pulse 67   Temp 98.3 F (36.8 C) (Oral)   Resp 18   SpO2 100%  Gen:   Awake, no distress   Resp:  Normal effort  MSK:   Moves extremities without difficulty  Other:    Medical Decision Making  Medically screening exam initiated at 8:09 PM.  Appropriate orders placed.  Ollivander See was informed that the remainder of the evaluation will be completed by another provider, this initial triage assessment does not replace that evaluation, and the importance of remaining in the ED until their evaluation is complete.  Intermittent sharp shooting pain to L flank to abdomen x 3 days, worse with movement.  No other sxs.    Fayrene Helper, PA-C 03/12/21 2010    Derwood Kaplan, MD 03/13/21 1140

## 2021-03-12 NOTE — ED Triage Notes (Signed)
Pt reports left back and abd pain X3 days.  No other symptoms at this time.

## 2021-03-13 ENCOUNTER — Emergency Department (HOSPITAL_COMMUNITY): Payer: Medicare Other

## 2021-03-13 MED ORDER — ACETAMINOPHEN 325 MG PO TABS
650.0000 mg | ORAL_TABLET | Freq: Once | ORAL | Status: AC
  Administered 2021-03-13: 650 mg via ORAL
  Filled 2021-03-13: qty 2

## 2021-03-13 MED ORDER — METHOCARBAMOL 500 MG PO TABS: 500.0000 mg | ORAL_TABLET | Freq: Two times a day (BID) | ORAL | 0 refills | Status: DC

## 2021-03-13 NOTE — ED Provider Notes (Signed)
[facility] EMERGENCY DEPARTMENT Provider Note   CSN: [id number] Arrival date & time: 03/12/21  1936     History Chief Complaint  Patient presents with   Abdominal Pain    Ricky Bailey is a 65 y.o. male.  The history is provided by the patient.  Abdominal Pain Pain location:  L flank Pain quality: aching and cramping   Pain radiates to:  Does not radiate Pain severity:  Moderate Onset quality:  Gradual Duration:  3 days Timing:  Intermittent Progression:  Unchanged Chronicity:  New Relieved by: rest. Worsened by:  Movement Associated symptoms: no chest pain, no dysuria, no fever, no shortness of breath and no vomiting      Patient with history of hypertension, hyperlipidemia presents with left flank pain for about 3 days. He reports it is worse with movement and twisting of his torso.  Patient reports he is a [employer] and wanted to get checked out before his next trip.  No heavy lifting or falls Denies fever/vomiting/chest pain/shortness of breath.  No urinary symptoms. 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   BPPV (benign paroxysmal positional vertigo) 03/28/2018   Vomiting 03/28/2018   Benign essential HTN 03/28/2018   Obesity (BMI 30-39.9) 03/28/2018    Past Surgical History:  Procedure Laterality Date   BACK SURGERY         Family History  Problem Relation Age of Onset   Heart disease Mother    Lung  cancer Father     Social History   Tobacco Use   Smoking status: Former    Types: Cigarettes    Quit date: 07/21/1977    Years since quitting: 43.6   Smokeless tobacco: Never  Vaping Use   Vaping Use: Never used  Substance Use Topics   Alcohol use: Yes    Alcohol/week: 6.0 standard drinks    Types: 6 Cans of beer per week   Drug use: No    Home Medications Prior to Admission medications   Medication Sig Start Date End Date Taking? Authorizing Provider  allopurinol (ZYLOPRIM) 300 MG tablet Take 300 mg by mouth daily. 03/07/18  Yes [provider]  amLODipine (NORVASC) 10 MG tablet Take 1 tablet (10 mg total) by mouth daily. 03/31/18  Yes Lahoma Crocker, MD  cetirizine (ZYRTEC) 10 MG tablet Take 10 mg by mouth daily as needed for allergies. 03/21/18  Yes [provider]  finasteride (PROSCAR) 5 MG tablet Take 1 tablet (5 mg total) by mouth daily. 03/31/18  Yes Lahoma Crocker, MD  hydrOXYzine (ATARAX/VISTARIL) 10 MG tablet Take 10 mg by mouth at bedtime. 03/21/18  Yes [provider]  lisinopril (ZESTRIL) 20 MG tablet Take 20 mg by mouth daily.   Yes [provider]  methocarbamol (ROBAXIN) 500 MG tablet Take 1 tablet (500 mg total) by mouth 2 (two) times daily. Patient taking differently: Take 500 mg by mouth  every 6 (six) hours as needed for muscle spasms. 11/07/20  Yes Harris, Abigail, PA-C  rosuvastatin (CRESTOR) 20 MG tablet Take 20 mg by mouth daily.   Yes [provider]  traMADol (ULTRAM) 50 MG tablet Take 50 mg by mouth 2 (two) times daily as needed for moderate pain.  03/07/18  Yes [provider]  Vitamin D, Ergocalciferol, (DRISDOL) 1.25 MG (50000 UNIT) CAPS capsule Take 50,000 Units by mouth once a week. 01/15/21  Yes [provider]  dexamethasone (DECADRON) 2 MG tablet Take 2 tablets once daily for 3 days, then 1 tablet once daily for 3 days, then STOP. Patient not taking: No sig reported 08/19/19   Osvaldo Shipper, MD  famotidine (PEPCID) 20 MG tablet Take 1 tablet (20 mg total) by mouth daily for 10 days. Patient not taking: Reported on 03/13/2021 08/19/19 08/29/19  Osvaldo Shipper, MD  lisinopril (PRINIVIL,ZESTRIL) 40 MG tablet Take 1 tablet (40 mg total) by mouth daily. Patient not taking: Reported on 03/13/2021 03/31/18   Lahoma Crocker, MD  methylPREDNISolone (MEDROL DOSEPAK) 4 MG TBPK tablet Use as directed Patient not taking: No sig reported 11/07/20   Arthor Captain, PA-C  oxyCODONE (ROXICODONE) 5 MG immediate release tablet Take 0.5-1 tablets (2.5-5 mg total) by mouth every 6 (six) hours as needed for severe pain. Patient not taking: No sig reported 11/07/20   Arthor Captain, PA-C    Allergies    Patient has no known allergies.  Review of Systems   Review of Systems  Constitutional:  Negative for fever.  Respiratory:  Negative for shortness of breath.   Cardiovascular:  Negative for chest pain.  Gastrointestinal:  Positive for abdominal pain. Negative for vomiting.  Genitourinary:  Negative for dysuria.  Neurological:  Negative for weakness and numbness.  All other systems reviewed and are negative.  Physical Exam Updated Vital Signs BP (!) 149/96 (BP Location: Left Arm)   Pulse (!) 57   Temp 98.3 F (36.8 C) (Oral)   Resp 16   SpO2 100%   Physical Exam CONSTITUTIONAL: Well developed/well nourished HEAD: Normocephalic/atraumatic EYES: EOMI/PERRL ENMT: Mucous membranes moist NECK: supple no meningeal signs SPINE/BACK:entire spine nontender CV: S1/S2 noted, no murmurs/rubs/gallops noted LUNGS: Lungs are clear to auscultation bilaterally, no apparent distress ABDOMEN: soft, nontender, no rebound or guarding, bowel sounds noted throughout abdomen Obese GU:no cva tenderness NEURO: Pt is awake/alert/appropriate, moves all extremitiesx4.  No facial droop.  Patient moves all extremities without difficulty.  No focal weakness noted EXTREMITIES: pulses normal/equal, full ROM SKIN:  warm, color normal, no rash noted to his flank or abdomen PSYCH: no abnormalities of mood noted, alert and oriented to situation  ED Results / Procedures / Treatments   Labs (all labs ordered are listed, but only abnormal results are displayed) Labs Reviewed  COMPREHENSIVE METABOLIC PANEL - Abnormal; Notable for the following components:      Result Value   Glucose, Bld 113 (*)    BUN 7 (*)    All other components within normal limits  URINALYSIS, ROUTINE W REFLEX MICROSCOPIC - Abnormal; Notable for the following components:   Color, Urine STRAW (*)    All other components within normal limits  CBC WITH DIFFERENTIAL/PLATELET  LIPASE, BLOOD    EKG None  Radiology CT Renal Stone Study  Result Date: 03/13/2021 CLINICAL DATA:  Flank pain EXAM: CT ABDOMEN AND PELVIS WITHOUT CONTRAST TECHNIQUE: Multidetector CT imaging of the abdomen and pelvis was performed following the standard protocol without IV contrast. COMPARISON:  None. FINDINGS: Lower chest: Lung bases are clear. No effusions. Heart is normal size. Hepatobiliary: Scattered hypodensities throughout the liver, likely small cysts. Gallbladder unremarkable. No biliary ductal dilatation. Pancreas: No focal abnormality or ductal dilatation. Spleen: No focal abnormality.  Normal size. Adrenals/Urinary Tract: Numerous hypodensities in the kidneys bilaterally, likely cysts measuring up to 4.5 cm in the right parapelvic region and 5.1 cm in the left parapelvic region. No hydronephrosis or stones. Adrenal glands and urinary bladder unremarkable. Stomach/Bowel: Normal appendix. Stomach, large and small bowel grossly unremarkable. Vascular/Lymphatic: Aortic atherosclerosis. No evidence of aneurysm or adenopathy. Reproductive: No visible focal abnormality. Other: No free fluid or free air. Musculoskeletal: No acute bony abnormality. IMPRESSION: Bilateral renal parapelvic cysts. No renal or ureteral stones. No hydronephrosis. Aortic atherosclerosis.  Scattered hypodensities in the liver, likely cysts. No acute findings in the abdomen or pelvis. Electronically Signed   By: Charlett Nose M.D.   On: 03/13/2021 01:20    Procedures Procedures   Medications Ordered in ED Medications  acetaminophen (TYLENOL) tablet 650 mg (has no administration in time range)    ED Course  I have reviewed the triage vital signs and the nursing notes.  Pertinent labs & imaging results that were available during my care of the patient were reviewed by me and considered in my medical decision making (see chart for details).    MDM Rules/Calculators/A&P                           Patient well-appearing. Imaging performed without any acute findings except for renal cyst.  He will be referred to urology for follow-up on this. Patient requesting muscle relaxant, this will be prescribed but advised not to use while driving  Final Clinical Impression(s) / ED Diagnoses Final diagnoses:  Flank pain  Renal cyst    Rx / DC Orders ED Discharge Orders     None        Zadie Rhine, MD 03/13/21 [phone]

## 2022-05-25 ENCOUNTER — Emergency Department (HOSPITAL_COMMUNITY)
Admission: EM | Admit: 2022-05-25 | Discharge: 2022-05-25 | Disposition: A | Discharge status: Home / Self Care | Payer: BC Managed Care – PPO | Attending: Emergency Medicine | Admitting: Emergency Medicine

## 2022-05-25 ENCOUNTER — Encounter (HOSPITAL_COMMUNITY): Payer: Self-pay | Admitting: Emergency Medicine

## 2022-05-25 ENCOUNTER — Emergency Department (HOSPITAL_COMMUNITY): Payer: BC Managed Care – PPO

## 2022-05-25 ENCOUNTER — Other Ambulatory Visit: Payer: Self-pay

## 2022-05-25 DIAGNOSIS — R109 Unspecified abdominal pain: Secondary | ICD-10-CM

## 2022-05-25 LAB — CBC
HCT: 41.7 % (ref 39.0–52.0)
Hemoglobin: 14.4 g/dL (ref 13.0–17.0)
MCH: 30.7 pg (ref 26.0–34.0)
MCHC: 34.5 g/dL (ref 30.0–36.0)
MCV: 88.9 fL (ref 80.0–100.0)
Platelets: 231 10*3/uL (ref 150–400)
RBC: 4.69 MIL/uL (ref 4.22–5.81)
RDW: 13.2 % (ref 11.5–15.5)
WBC: 5.3 10*3/uL (ref 4.0–10.5)
nRBC: 0 % (ref 0.0–0.2)

## 2022-05-25 LAB — URINALYSIS, ROUTINE W REFLEX MICROSCOPIC
Bilirubin Urine: NEGATIVE
Glucose, UA: NEGATIVE mg/dL
Hgb urine dipstick: NEGATIVE
Ketones, ur: NEGATIVE mg/dL
Leukocytes,Ua: NEGATIVE
Nitrite: NEGATIVE
Protein, ur: NEGATIVE mg/dL
Specific Gravity, Urine: 1.018 (ref 1.005–1.030)
pH: 5 (ref 5.0–8.0)

## 2022-05-25 LAB — BASIC METABOLIC PANEL
Anion gap: 10 (ref 5–15)
BUN: 12 mg/dL (ref 8–23)
CO2: 21 mmol/L — ABNORMAL LOW (ref 22–32)
Calcium: 9.1 mg/dL (ref 8.9–10.3)
Chloride: 110 mmol/L (ref 98–111)
Creatinine, Ser: 0.85 mg/dL (ref 0.61–1.24)
GFR, Estimated: >60 mL/min (ref >60)
Glucose, Bld: 98 mg/dL (ref 70–99)
Potassium: 3.8 mmol/L (ref 3.5–5.1)
Sodium: 141 mmol/L (ref 135–145)

## 2022-05-25 MED ORDER — ACETAMINOPHEN 500 MG PO TABS
1000.0000 mg | ORAL_TABLET | Freq: Once | ORAL | Status: AC
  Administered 2022-05-25: 1000 mg via ORAL
  Filled 2022-05-25: qty 2

## 2022-05-25 MED ORDER — METHOCARBAMOL 500 MG PO TABS
500.0000 mg | ORAL_TABLET | Freq: Once | ORAL | Status: AC
  Administered 2022-05-25: 500 mg via ORAL
  Filled 2022-05-25: qty 1

## 2022-05-25 MED ORDER — METHOCARBAMOL 500 MG PO TABS: 500.0000 mg | ORAL_TABLET | Freq: Two times a day (BID) | ORAL | 0 refills | Status: DC

## 2022-05-25 NOTE — ED Triage Notes (Incomplete)
Per [facility] pt coming from home c/o left flank pain x 5 days. Reports pain has been intermittent over the past 5 years.

## 2022-05-25 NOTE — ED Provider Triage Note (Signed)
Emergency Medicine Provider Triage Evaluation Note  Ricky Bailey , a 66 y.o. male  was evaluated in triage.  Pt complains of L side pain x 1 week. Hx of similar and says that normally he takes some medicine and rests and it goes away. Continues to have sharp stabbing pain in his left side with any movement. Hx of pneumonia and respiratory failure.   Review of Systems  Positive: L side pain Negative: CP, abd pain, urinary sx, N/V/D  Physical Exam  BP 136/83   Pulse 69   Temp 98.2 F (36.8 C) (Oral)   Resp 16   Ht 5\' 11"  (1.803 m)   Wt 115.7 kg   SpO2 100%   BMI 35.57 kg/m  Gen:   Awake, no distress   Resp:  Normal effort  MSK:   Moves extremities without difficulty  Other:    Medical Decision Making  Medically screening exam initiated at 12:46 PM.  Appropriate orders placed.  Nyxon Strupp was informed that the remainder of the evaluation will be completed by another provider, this initial triage assessment does not replace that evaluation, and the importance of remaining in the ED until their evaluation is complete.  Workup initiated   Selinda Orion, PA-C 05/25/22 1247

## 2022-05-25 NOTE — ED Notes (Signed)
Patient alerts and oriented x4 at this time and able to stand by self. Patient taken all his belongings with him and denies missing anything upon leaving the ED.

## 2022-05-25 NOTE — ED Provider Notes (Signed)
[facility] EMERGENCY DEPARTMENT Provider Note   CSN: [id number] Arrival date & time: 05/25/22  1230     History  Chief Complaint  Patient presents with   Flank Pain    Ricky Bailey is a 66 y.o. male.  Patient presents with intermittent left side pain that occurs only with movement.  Patient's had this once in the past and it resolved with Robaxin and time.  No recent injuries or lifting heavy.  Patient's had a bulging disc in the past.  Patient denies any neurologic signs or symptoms.  Patient denies fevers chills or urinary symptoms.  No shortness of breath, no history of blood clot in the lungs.  No history of kidney stones.  Patient has mild symptoms currently.  No radiation of pain.       Home Medications Prior to Admission medications   Medication Sig Start Date End Date Taking? Authorizing Provider  allopurinol (ZYLOPRIM) 300 MG tablet Take 300 mg by mouth daily. 03/07/18   [provider]  amLODipine (NORVASC) 10 MG tablet Take 1 tablet (10 mg total) by mouth daily. 03/31/18   Lahoma Crocker, MD  cetirizine (ZYRTEC) 10 MG tablet Take 10 mg by mouth daily as needed for allergies. 03/21/18   [provider]  dexamethasone (DECADRON) 2 MG tablet Take 2 tablets once daily for 3 days, then 1 tablet once daily for 3 days, then STOP. Patient not taking: No sig reported 08/19/19   Osvaldo Shipper, MD  famotidine (PEPCID) 20 MG tablet Take 1 tablet (20 mg total) by mouth daily for 10 days. Patient not taking: Reported on 03/13/2021 08/19/19 08/29/19  Osvaldo Shipper, MD  finasteride (PROSCAR) 5 MG tablet Take 1 tablet (5 mg total) by mouth daily. 03/31/18   Lahoma Crocker, MD  hydrOXYzine (ATARAX/VISTARIL) 10 MG tablet Take 10 mg by mouth at bedtime. 03/21/18   [provider]  lisinopril (PRINIVIL,ZESTRIL) 40 MG tablet Take 1 tablet (40 mg total) by mouth daily. Patient not taking: Reported on 03/13/2021 03/31/18   Lahoma Crocker, MD   lisinopril (ZESTRIL) 20 MG tablet Take 20 mg by mouth daily.    [provider]  methocarbamol (ROBAXIN) 500 MG tablet Take 1 tablet (500 mg total) by mouth 2 (two) times daily. 05/25/22   Blane Ohara, MD  rosuvastatin (CRESTOR) 20 MG tablet Take 20 mg by mouth daily.    [provider]  traMADol (ULTRAM) 50 MG tablet Take 50 mg by mouth 2 (two) times daily as needed for moderate pain.  03/07/18   [provider]  Vitamin D, Ergocalciferol, (DRISDOL) 1.25 MG (50000 UNIT) CAPS capsule Take 50,000 Units by mouth once a week. 01/15/21   [provider]      Allergies    Patient has no known allergies.    Review of Systems   Review of Systems  Constitutional:  Negative for chills and fever.  HENT:  Negative for congestion.   Eyes:  Negative for visual disturbance.  Respiratory:  Negative for shortness of breath.   Cardiovascular:  Negative for chest pain.  Gastrointestinal:  Negative for abdominal pain and vomiting.  Genitourinary:  Positive for flank pain. Negative for dysuria, hematuria and urgency.  Musculoskeletal:  Negative for back pain, neck pain and neck stiffness.  Skin:  Negative for rash.  Neurological:  Negative for light-headedness and headaches.    Physical Exam Updated Vital Signs BP 137/86 (BP Location: Left Arm)   Pulse 61   Temp  98.1 F (36.7 C) (Oral)   Resp 18   Ht 5\' 11"  (1.803 m)   Wt 115.7 kg   SpO2 99%   BMI 35.57 kg/m  Physical Exam Vitals and nursing note reviewed.  Constitutional:      General: He is not in acute distress.    Appearance: He is well-developed.  HENT:     Head: Normocephalic and atraumatic.     Mouth/Throat:     Mouth: Mucous membranes are moist.  Eyes:     General:        Right eye: No discharge.        Left eye: No discharge.     Conjunctiva/sclera: Conjunctivae normal.  Neck:     Trachea: No tracheal deviation.  Cardiovascular:     Rate and Rhythm: Normal rate and regular rhythm.   Pulmonary:     Effort: Pulmonary effort is normal.     Breath sounds: Normal breath sounds.  Abdominal:     General: There is no distension.     Palpations: Abdomen is soft.     Tenderness: There is no abdominal tenderness. There is no guarding.  Musculoskeletal:        General: No swelling or tenderness.     Cervical back: Normal range of motion and neck supple. No rigidity.     Comments: Patient has no midline tenderness lumbar thoracic spine, no reproducible left flank pain, no rash, no swelling.  Skin:    General: Skin is warm.     Capillary Refill: Capillary refill takes less than 2 seconds.     Findings: No rash.  Neurological:     General: No focal deficit present.     Mental Status: He is alert.     Cranial Nerves: No cranial nerve deficit.  Psychiatric:        Mood and Affect: Mood normal.     ED Results / Procedures / Treatments   Labs (all labs ordered are listed, but only abnormal results are displayed) Labs Reviewed  BASIC METABOLIC PANEL - Abnormal; Notable for the following components:      Result Value   CO2 21 (*)    All other components within normal limits  URINALYSIS, ROUTINE W REFLEX MICROSCOPIC  CBC    EKG None  Radiology DG Chest 2 View  Result Date: 05/25/2022 CLINICAL DATA:  Left-sided chest pain. History of respiratory failure. EXAM: CHEST - 2 VIEW COMPARISON:  Radiographs 08/14/2019.  CT 06/26/2008. FINDINGS: The heart size and mediastinal contours are normal. The lungs are clear. There is no pleural effusion or pneumothorax. No acute osseous findings are identified. Stable findings of probable diffuse idiopathic skeletal hyperostosis in the thoracic spine. IMPRESSION: No active cardiopulmonary process. Stable findings of probable diffuse idiopathic skeletal hyperostosis in the thoracic spine. Electronically Signed   By: 06/28/2008 M.D.   On: 05/25/2022 13:30    Procedures Procedures    Medications Ordered in ED Medications   acetaminophen (TYLENOL) tablet 1,000 mg (1,000 mg Oral Given 05/25/22 1717)  methocarbamol (ROBAXIN) tablet 500 mg (500 mg Oral Given 05/25/22 1717)    ED Course/ Medical Decision Making/ A&P                           Medical Decision Making Risk OTC drugs. Prescription drug management.   Patient presents with intermittent stabbing flank pain that occurs only with movement.  Primary differential includes musculoskeletal given history, other differentials include kidney  stone, kidney infection, lower lobe lung pathology such as pleural effusion, other.  No traumatic history.  Patient's pain overall mild, Tylenol and Robaxin ordered as patient's done well with this in the past.  General blood work showed normal kidney function, normal hemoglobin and normal white blood cell count.  Urinalysis reviewed no signs of blood or infection.  We will hold on CT scan at this time and instructed patient detailed reasons to return he is comfortable this plan.        Final Clinical Impression(s) / ED Diagnoses Final diagnoses:  Acute left flank pain    Rx / DC Orders ED Discharge Orders          Ordered    methocarbamol (ROBAXIN) 500 MG tablet  2 times daily        05/25/22 1738              Blane Ohara, MD 05/25/22 1741

## 2022-05-25 NOTE — Discharge Instructions (Signed)
Use Tylenol every 4 hours for pain and Robaxin up to twice a day for muscle spasm. Remember Robaxin can make you sleepy so be careful. Return to the ER for uncontrolled pain, blood in urine, fevers, abdominal pain or new concerns.

## 2022-05-28 ENCOUNTER — Emergency Department (HOSPITAL_COMMUNITY): Payer: BC Managed Care – PPO

## 2022-05-28 ENCOUNTER — Other Ambulatory Visit: Payer: Self-pay

## 2022-05-28 ENCOUNTER — Encounter (HOSPITAL_COMMUNITY): Payer: Self-pay

## 2022-05-28 ENCOUNTER — Emergency Department (HOSPITAL_COMMUNITY)
Admission: EM | Admit: 2022-05-28 | Discharge: 2022-05-28 | Disposition: A | Discharge status: Home / Self Care | Payer: BC Managed Care – PPO | Attending: Emergency Medicine | Admitting: Emergency Medicine

## 2022-05-28 DIAGNOSIS — R9431 Abnormal electrocardiogram [ECG] [EKG]: Secondary | ICD-10-CM | POA: Diagnosis not present

## 2022-05-28 DIAGNOSIS — I10 Essential (primary) hypertension: Secondary | ICD-10-CM | POA: Insufficient documentation

## 2022-05-28 DIAGNOSIS — R109 Unspecified abdominal pain: Secondary | ICD-10-CM | POA: Insufficient documentation

## 2022-05-28 LAB — CBC WITH DIFFERENTIAL/PLATELET
Abs Immature Granulocytes: 0.01 10*3/uL (ref 0.00–0.07)
Basophils Absolute: 0 10*3/uL (ref 0.0–0.1)
Basophils Relative: 1 %
Eosinophils Absolute: 0.2 10*3/uL (ref 0.0–0.5)
Eosinophils Relative: 4 %
HCT: 46.1 % (ref 39.0–52.0)
Hemoglobin: 16.2 g/dL (ref 13.0–17.0)
Immature Granulocytes: 0 %
Lymphocytes Relative: 39 %
Lymphs Abs: 2.4 10*3/uL (ref 0.7–4.0)
MCH: 30.7 pg (ref 26.0–34.0)
MCHC: 35.1 g/dL (ref 30.0–36.0)
MCV: 87.3 fL (ref 80.0–100.0)
Monocytes Absolute: 0.5 10*3/uL (ref 0.1–1.0)
Monocytes Relative: 8 %
Neutro Abs: 3 10*3/uL (ref 1.7–7.7)
Neutrophils Relative %: 48 %
Platelets: 239 10*3/uL (ref 150–400)
RBC: 5.28 MIL/uL (ref 4.22–5.81)
RDW: 13.2 % (ref 11.5–15.5)
WBC: 6.2 10*3/uL (ref 4.0–10.5)
nRBC: 0 % (ref 0.0–0.2)

## 2022-05-28 LAB — URINALYSIS, ROUTINE W REFLEX MICROSCOPIC
Bilirubin Urine: NEGATIVE
Glucose, UA: NEGATIVE mg/dL
Hgb urine dipstick: NEGATIVE
Ketones, ur: NEGATIVE mg/dL
Leukocytes,Ua: NEGATIVE
Nitrite: NEGATIVE
Protein, ur: NEGATIVE mg/dL
Specific Gravity, Urine: 1.014 (ref 1.005–1.030)
pH: 5 (ref 5.0–8.0)

## 2022-05-28 LAB — LIPASE, BLOOD: Lipase: 38 U/L (ref 11–51)

## 2022-05-28 LAB — COMPREHENSIVE METABOLIC PANEL
ALT: 32 U/L (ref 0–44)
AST: 26 U/L (ref 15–41)
Albumin: 4.5 g/dL (ref 3.5–5.0)
Alkaline Phosphatase: 47 U/L (ref 38–126)
Anion gap: 10 (ref 5–15)
BUN: 13 mg/dL (ref 8–23)
CO2: 22 mmol/L (ref 22–32)
Calcium: 9.7 mg/dL (ref 8.9–10.3)
Chloride: 105 mmol/L (ref 98–111)
Creatinine, Ser: 0.87 mg/dL (ref 0.61–1.24)
GFR, Estimated: >60 mL/min (ref >60)
Glucose, Bld: 106 mg/dL — ABNORMAL HIGH (ref 70–99)
Potassium: 3.7 mmol/L (ref 3.5–5.1)
Sodium: 137 mmol/L (ref 135–145)
Total Bilirubin: 1.2 mg/dL (ref 0.3–1.2)
Total Protein: 7.6 g/dL (ref 6.5–8.1)

## 2022-05-28 MED ORDER — HYDROCODONE-ACETAMINOPHEN 5-325 MG PO TABS
1.0000 | ORAL_TABLET | Freq: Once | ORAL | Status: AC
  Administered 2022-05-28: 1 via ORAL
  Filled 2022-05-28: qty 1

## 2022-05-28 MED ORDER — LIDOCAINE 5 % EX PTCH
1.0000 | MEDICATED_PATCH | CUTANEOUS | Status: DC
  Administered 2022-05-28: 1 via TRANSDERMAL
  Filled 2022-05-28: qty 1

## 2022-05-28 MED ORDER — HYDROCODONE-ACETAMINOPHEN 5-325 MG PO TABS: 2.0000 | ORAL_TABLET | Freq: Four times a day (QID) | ORAL | 0 refills | Status: DC | PRN

## 2022-05-28 MED ORDER — HYDROCODONE-ACETAMINOPHEN 5-325 MG PO TABS: 2.0000 | ORAL_TABLET | Freq: Four times a day (QID) | ORAL | 0 refills | Status: AC | PRN

## 2022-05-28 MED ORDER — LIDOCAINE 5 % EX PTCH: 1.0000 | MEDICATED_PATCH | CUTANEOUS | 0 refills | Status: DC

## 2022-05-28 MED ORDER — KETOROLAC TROMETHAMINE 15 MG/ML IJ SOLN
15.0000 mg | Freq: Once | INTRAMUSCULAR | Status: AC
  Administered 2022-05-28: 15 mg via INTRAVENOUS
  Filled 2022-05-28: qty 1

## 2022-05-28 NOTE — ED Provider Notes (Signed)
[facility] EMERGENCY DEPARTMENT Provider Note   CSN: [id number] Arrival date & time: 05/28/22  1622     History  Chief Complaint  Patient presents with   Flank Pain    Ricky Bailey is a 66 year old male with a history of hypertension who presents to the emergency department for left flank pain.  The patient states that the pain has been present for the past 4 to 5 days.  He was seen in our emergency department 3 days ago, where laboratory studies and urinalysis were reassuring, and he was discharged home with muscle relaxants.  He states that the muscle relaxants and Tylenol have helped with his pain, however today after getting up from a nap he had significant worsening of the pain and came back to get reevaluated.  He states that it is worse with movement, radiates around from the right CVA to around his left side.  He denies any dysuria, hematuria, or prior kidney stone.  He has no abdominal pain, chest pain, or shortness of breath.  No nausea or vomiting.  The history is provided by the patient and medical records.       Home Medications Prior to Admission medications   Medication Sig Start Date End Date Taking? Authorizing Provider  lidocaine (LIDODERM) 5 % Place 1 patch onto the skin daily. Remove & Discard patch within 12 hours or as directed by MD 05/28/22  Yes Stephanie Coup, MD  allopurinol (ZYLOPRIM) 300 MG tablet Take 300 mg by mouth daily. 03/07/18   [provider]  amLODipine (NORVASC) 10 MG tablet Take 1 tablet (10 mg total) by mouth daily. 03/31/18   Lahoma Crocker, MD  cetirizine (ZYRTEC) 10 MG tablet Take 10 mg by mouth daily as needed for allergies. 03/21/18   [provider]  dexamethasone (DECADRON) 2 MG tablet Take 2 tablets once daily for 3 days, then 1 tablet once daily for 3 days, then STOP. Patient not taking: No sig reported 08/19/19   Osvaldo Shipper, MD  famotidine (PEPCID) 20 MG tablet Take 1 tablet (20 mg total)  by mouth daily for 10 days. Patient not taking: Reported on 03/13/2021 08/19/19 08/29/19  Osvaldo Shipper, MD  finasteride (PROSCAR) 5 MG tablet Take 1 tablet (5 mg total) by mouth daily. 03/31/18   Lahoma Crocker, MD  HYDROcodone-acetaminophen (NORCO/VICODIN) 5-325 MG tablet Take 2 tablets by mouth every 6 (six) hours as needed for up to 3 days for severe pain. 05/28/22 05/31/22  Stephanie Coup, MD  hydrOXYzine (ATARAX/VISTARIL) 10 MG tablet Take 10 mg by mouth at bedtime. 03/21/18   [provider]  lisinopril (PRINIVIL,ZESTRIL) 40 MG tablet Take 1 tablet (40 mg total) by mouth daily. Patient not taking: Reported on 03/13/2021 03/31/18   Lahoma Crocker, MD  lisinopril (ZESTRIL) 20 MG tablet Take 20 mg by mouth daily.    [provider]  methocarbamol (ROBAXIN) 500 MG tablet Take 1 tablet (500 mg total) by mouth 2 (two) times daily. 05/25/22   Blane Ohara, MD  rosuvastatin (CRESTOR) 20 MG tablet Take 20 mg by mouth daily.    [provider]  traMADol (ULTRAM) 50 MG tablet Take 50 mg by mouth 2 (two) times daily as needed for moderate pain.  03/07/18   [provider]  Vitamin D, Ergocalciferol, (DRISDOL) 1.25 MG (50000 UNIT) CAPS capsule Take 50,000 Units by mouth once a week. 01/15/21   [provider]      Allergies    Patient has  no known allergies.    Review of Systems   Review of Systems  See HPI.   Physical Exam Updated Vital Signs BP (!) 135/96   Pulse 79   Temp (!) 97.3 F (36.3 C) (Axillary)   Resp 14   Ht 5\' 11"  (1.803 m)   Wt 117.9 kg   SpO2 98%   BMI 36.26 kg/m   Physical Exam Vitals and nursing note reviewed.  Constitutional:      General: He is not in acute distress.    Appearance: He is not toxic-appearing.  HENT:     Head: Normocephalic and atraumatic.  Eyes:     Extraocular Movements: Extraocular movements intact.  Cardiovascular:     Rate and Rhythm: Normal rate and regular rhythm.     Pulses: Normal  pulses.     Heart sounds: Normal heart sounds.  Pulmonary:     Effort: Pulmonary effort is normal.     Breath sounds: Normal breath sounds.  Abdominal:     General: There is no distension.     Palpations: Abdomen is soft.     Tenderness: There is left CVA tenderness. There is no guarding or rebound.  Musculoskeletal:        General: No deformity. Normal range of motion.     Cervical back: Normal range of motion.  Skin:    General: Skin is warm and dry.  Neurological:     General: No focal deficit present.     Mental Status: He is alert and oriented to person, place, and time.     Sensory: No sensory deficit.     Motor: No weakness.     ED Results / Procedures / Treatments   Labs (all labs ordered are listed, but only abnormal results are displayed) Labs Reviewed  COMPREHENSIVE METABOLIC PANEL - Abnormal; Notable for the following components:      Result Value   Glucose, Bld 106 (*)    All other components within normal limits  URINALYSIS, ROUTINE W REFLEX MICROSCOPIC - Abnormal; Notable for the following components:   Color, Urine STRAW (*)    All other components within normal limits  CBC WITH DIFFERENTIAL/PLATELET  LIPASE, BLOOD    EKG EKG Interpretation  Date/Time:  Sunday May 28 2022 16:39:11 EST Ventricular Rate:  80 PR Interval:  230 QRS Duration: 91 QT Interval:  389 QTC Calculation: 449 R Axis:   49 Text Interpretation: Sinus rhythm Prolonged PR interval Low voltage, precordial leads Borderline repolarization abnormality No acute changes No significant change since last tracing Confirmed by [id number] [phone]) on 05/28/2022 5:58:06 PM  Radiology CT ABDOMEN PELVIS WO CONTRAST  Result Date: 05/28/2022 CLINICAL DATA:  Abdomen/flank pain. Left lower flank pain for 4 days. EXAM: CT ABDOMEN AND PELVIS WITHOUT CONTRAST TECHNIQUE: Multidetector CT imaging of the abdomen and pelvis was performed following the standard protocol without IV contrast.  RADIATION DOSE REDUCTION: This exam was performed according to the departmental dose-optimization program which includes automated exposure control, adjustment of the mA and/or kV according to patient size and/or use of iterative reconstruction technique. COMPARISON:  CT scan of the abdomen pelvis without contrast March 13, 2021 FINDINGS: Lower chest: There is a small hiatal hernia. Lung bases are otherwise normal. Hepatobiliary: Hepatic steatosis. Multiple low-attenuation lesions in the liver are stable since September 2022, most consistent with cysts or hemangiomas. The gallbladder is normal. No other abnormalities. Pancreas: Unremarkable. No pancreatic ductal dilatation or surrounding inflammatory changes. Spleen: Normal in size without focal abnormality.  Adrenals/Urinary Tract: Adrenal glands are normal. Bilateral parapelvic cysts are again identified, also described on the previous study. The cysts are unchanged in size. No hydronephrosis. No solid mass. Minimal posterior perinephric stranding is stable. The ureters are normal in caliber with no ureteral stones. The bladder is normal. Stomach/Bowel: Other than the small hiatal hernia, the stomach is normal. The small bowel is normal. The sigmoid colon is tortuous but otherwise normal. A few scattered colonic diverticuli are identified without diverticulitis. The appendix is normal. Vascular/Lymphatic: Calcified atherosclerotic changes identified in the nonaneurysmal aorta. Adenopathy. Reproductive: Prostate is unremarkable. Other: A fat containing umbilical hernia is noted. Musculoskeletal: Degenerative changes are identified in the lumbar spine. IMPRESSION: 1. No cause for the patient's left lower quadrant pain identified. 2. Hepatic steatosis. 3. Multiple low-attenuation lesions in the liver are stable since September 2022, most consistent with cysts or hemangiomas. 4. Bilateral parapelvic renal cysts. No follow-up imaging recommended for the parapelvic  cysts. No renal stones or obstruction. 5. Calcified atherosclerotic change in the nonaneurysmal aorta. 6. A few scattered colonic diverticuli are identified without diverticulitis. 7. Fat containing umbilical hernia. 8. Degenerative changes in the lumbar spine. 9. Aortic atherosclerosis. Aortic Atherosclerosis (ICD10-I70.0). Electronically Signed   By: Gerome Sam III M.D.   On: 05/28/2022 17:26    Procedures Procedures    Medications Ordered in ED Medications  lidocaine (LIDODERM) 5 % 1 patch (has no administration in time range)  HYDROcodone-acetaminophen (NORCO/VICODIN) 5-325 MG per tablet 1 tablet (has no administration in time range)  ketorolac (TORADOL) 15 MG/ML injection 15 mg (15 mg Intravenous Given 05/28/22 1727)    ED Course/ Medical Decision Making/ A&P                           Medical Decision Making Problems Addressed: Abnormal ECG: complicated acute illness or injury Left flank pain: acute illness or injury that poses a threat to life or bodily functions  Amount and/or Complexity of Data Reviewed External Data Reviewed: notes. Labs: ordered. Decision-making details documented in ED Course. Radiology: ordered and independent interpretation performed. Decision-making details documented in ED Course. ECG/medicine tests: ordered and independent interpretation performed. Decision-making details documented in ED Course.  Risk Prescription drug management.   Ricky Bailey is a 66 year old male with a history of hypertension who presents to the emergency department for left flank pain x4-5 days as above.   On initial exam, patient well-appearing and hemodynamically stable.  Exam notable for left CVA tenderness, left flank tenderness.  Pain does seem musculoskeletal in nature, as it is worse with movement and improved with muscle relaxants, however as he patient does have persistent CVA tenderness and CT scan was discussed but not performed at last ED visit, will obtain  CT stone study today to evaluate for possible nephrolithiasis.  Patient has a nontender abdomen that is soft on exam, do not suspect other acute intra-abdominal pathology such as pancreatitis, bowel obstruction, biliary disease, mesenteric ischemia, or other emergent intra-abdominal pathology.  Workup: Work-up today overall reassuring.  CBC without leukocytosis to suggest significant infection, hemoglobin within normal limits.  CMP with normal creatinine, 0.87.  No elevated LFTs or electrolyte derangements.  No elevation in anion gap to suggest significant lactic acidosis.  Lipase within normal limits.  UA with negative nitrites, negative leukocytes, no blood noted, no evidence of UTI or nephrolithiasis.  CT abdomen pelvis does not demonstrate evidence of nephrolithiasis or other acute pathology to explain patient's symptoms.  EKG  was notable for Q waves, patient was made aware of this and instructed to follow-up with his PCP.  No evidence of STEMI.  Patient made hemodynamically stable and well-appearing on multiple reassessments.  Patient's pain was controlled with Toradol, also given Norco and lidocaine patch.  Suspect that symptoms are secondary to musculoskeletal strain.  This was discussed with the patient, and strict return precautions were given.  Patient sent home with prescription for short course of Norco for breakthrough pain in addition to his current pain regimen, and lidocaine patches.  Patient voiced understanding of and agreement with this plan, and was discharged in stable condition.        Final Clinical Impression(s) / ED Diagnoses Final diagnoses:  Abnormal ECG  Left flank pain    Rx / DC Orders ED Discharge Orders          Ordered    HYDROcodone-acetaminophen (NORCO/VICODIN) 5-325 MG tablet  Every 6 hours PRN,   Status:  Discontinued        05/28/22 1837    lidocaine (LIDODERM) 5 %  Every 24 hours        05/28/22 1837    HYDROcodone-acetaminophen (NORCO/VICODIN) 5-325  MG tablet  Every 6 hours PRN        05/28/22 1837              Stephanie CoupGelmann, Aryah Doering, MD 05/28/22 [id number]    Derwood KaplanNanavati, Ankit, MD 05/28/22 2338

## 2022-05-28 NOTE — Discharge Instructions (Addendum)
We are sending you home with a prescription for lidocaine patches and Norco.  You can take the Norco for breakthrough severe pain once every 6 hours.  Do not drive or operate heavy machinery after taking this medication, as it can make you sleepy.  Your EKG did show some changes of possible heart disease, but no heart attack today. Please be sure to follow up with your primary doctor about further testing for this.  Your CT scan showed a spots in the liver which have been stable since your last CT scan in September 2022.  This is likely nothing to be concerned about, however you should discuss this with your primary doctor for long-term follow-up as you may need repeat imaging in the future.  Please return to the emergency department if you have new chest pain, difficulty breathing, worsening abdominal pain, fevers, recurrent vomiting, blood in your stool, passing out, or if you have any other reason to think you need emergency care.  We hope that you feel better soon.

## 2022-05-28 NOTE — ED Triage Notes (Signed)
Patient coming from home via [facility] with complaints of  lower left flank pain for the past 4 days. Patient was seen in ED on Nov 16th and has had Increased pain sine last here. Patient reports No history of kidney issues but history of HTN.

## 2022-05-28 NOTE — ED Notes (Signed)
Patient transported to CT 

## 2022-06-19 ENCOUNTER — Encounter: Payer: Self-pay | Admitting: Gastroenterology

## 2022-08-07 ENCOUNTER — Encounter

## 2022-08-07 ENCOUNTER — Telehealth: Payer: Self-pay

## 2022-08-07 NOTE — Telephone Encounter (Signed)
Multiple attempts made to reach patient; unable to leave message as "mailbox full";  Will attempt to reach patient at a later time; if unable to reach patient- PV and procedure will be cancelled; a no show letter will be sent to the patient;

## 2022-08-09 NOTE — Telephone Encounter (Signed)
No show letter sent to the patient.

## 2022-08-14 ENCOUNTER — Encounter: Payer: Self-pay | Admitting: Gastroenterology

## 2022-08-28 ENCOUNTER — Encounter: Admitting: Gastroenterology

## 2022-09-25 ENCOUNTER — Ambulatory Visit (AMBULATORY_SURGERY_CENTER): Payer: BC Managed Care – PPO | Admitting: *Deleted

## 2022-09-25 ENCOUNTER — Encounter: Payer: Self-pay | Admitting: Gastroenterology

## 2022-09-25 VITALS — Ht 71.0 in | Wt 255.0 lb

## 2022-09-25 DIAGNOSIS — Z1211 Encounter for screening for malignant neoplasm of colon: Secondary | ICD-10-CM

## 2022-09-25 MED ORDER — NA SULFATE-K SULFATE-MG SULF 17.5-3.13-1.6 GM/177ML PO SOLN: 1.0000 | Freq: Once | ORAL | 0 refills | Status: AC

## 2022-09-25 NOTE — Progress Notes (Addendum)
No egg or soy allergy known to patient  No issues known to pt with past sedation with any surgeries or procedures Patient denies ever being told they had issues or difficulty with intubation  No FH of Malignant Hyperthermia Pt is not on diet pills Pt is not on  home 02  Pt is not on blood thinners  Pt denies issues with constipation  Pt is not on dialysis Pt denies any upcoming cardiac testing Pt encouraged to use to use Singlecare or Goodrx to reduce cost  Patient's chart reviewed by Osvaldo Angst CNRA prior to previsit and patient appropriate for the [place].  Previsit completed and red dot placed by patient's name on their procedure day (on provider's schedule).  . Instructions reviewed  Visit by phone Pt states weght is 255 lb Instructions reviewed with pt and pt states understanding. Instructed to review again prior to procedure. Pt states they will.  Instructions sent by mail with coupon Pt transferred to [facility] for insurance information update

## 2022-10-16 ENCOUNTER — Ambulatory Visit: Payer: BC Managed Care – PPO | Admitting: Gastroenterology

## 2022-10-16 ENCOUNTER — Encounter: Payer: Self-pay | Admitting: Gastroenterology

## 2022-10-16 VITALS — BP 174/103 | HR 60 | Temp 97.1°F | Resp 14 | Ht 71.0 in | Wt 255.0 lb

## 2022-10-16 DIAGNOSIS — Z1211 Encounter for screening for malignant neoplasm of colon: Secondary | ICD-10-CM | POA: Diagnosis present

## 2022-10-16 MED ORDER — SODIUM CHLORIDE 0.9 % IV SOLN: 500.0000 mL | Freq: Once | INTRAVENOUS | Status: DC

## 2022-10-16 NOTE — Progress Notes (Signed)
Pt's states no medical or surgical changes since previsit or office visit. 

## 2022-10-16 NOTE — Patient Instructions (Signed)
   Handouts on diverticulosis & hemorrhoids given to you today   Recommend repeat Colonoscopy in 1 year with a 2 day prep    YOU HAD AN ENDOSCOPIC PROCEDURE TODAY AT THE [facility]:   Refer to the procedure report that was given to you for any specific questions about what was found during the examination.  If the procedure report does not answer your questions, please call your gastroenterologist to clarify.  If you requested that your care partner not be given the details of your procedure findings, then the procedure report has been included in a sealed envelope for you to review at your convenience later.  YOU SHOULD EXPECT: Some feelings of bloating in the abdomen. Passage of more gas than usual.  Walking can help get rid of the air that was put into your GI tract during the procedure and reduce the bloating. If you had a lower endoscopy (such as a colonoscopy or flexible sigmoidoscopy) you may notice spotting of blood in your stool or on the toilet paper. If you underwent a bowel prep for your procedure, you may not have a normal bowel movement for a few days.  Please Note:  You might notice some irritation and congestion in your nose or some drainage.  This is from the oxygen used during your procedure.  There is no need for concern and it should clear up in a day or so.  SYMPTOMS TO REPORT IMMEDIATELY:  Following lower endoscopy (colonoscopy or flexible sigmoidoscopy):  Excessive amounts of blood in the stool  Significant tenderness or worsening of abdominal pains  Swelling of the abdomen that is new, acute  Fever of 100F or higher    For urgent or emergent issues, a gastroenterologist can be reached at any hour by calling [phone]. Do not use MyChart messaging for urgent concerns.    DIET:  We do recommend a small meal at first, but then you may proceed to your regular diet.  Drink plenty of fluids but you should avoid alcoholic beverages for 24  hours.  ACTIVITY:  You should plan to take it easy for the rest of today and you should NOT DRIVE or use heavy machinery until tomorrow (because of the sedation medicines used during the test).    FOLLOW UP: Our staff will call the number listed on your records the next business day following your procedure.  We will call around 7:15- 8:00 am to check on you and address any questions or concerns that you may have regarding the information given to you following your procedure. If we do not reach you, we will leave a message.     If any biopsies were taken you will be contacted by phone or by letter within the next 1-3 weeks.  Please call us at [phone] if you have not heard about the biopsies in 3 weeks.    SIGNATURES/CONFIDENTIALITY: You and/or your care partner have signed paperwork which will be entered into your electronic medical record.  These signatures attest to the fact that that the information above on your After Visit Summary has been reviewed and is understood.  Full responsibility of the confidentiality of this discharge information lies with you and/or your care-partner.

## 2022-10-16 NOTE — Progress Notes (Signed)
[facility] History and Physical   Primary Care Physician:  Anselmo Pickler, MD   Reason for Procedure:  CRC screening  Plan:    Colonoscopy     HPI: Ricky Bailey is a 67 y.o. male    Past Medical History:  Diagnosis Date   Back pain    BPH (benign prostatic hyperplasia)    Gout    Hyperlipidemia    Hypertension    Obesity    Sleep apnea    Vertigo 03/28/2018   with vommiting    Past Surgical History:  Procedure Laterality Date   BACK SURGERY      Prior to Admission medications   Medication Sig Start Date End Date Taking? Authorizing Provider  allopurinol (ZYLOPRIM) 300 MG tablet Take 300 mg by mouth daily. 03/07/18  Yes [provider]  amLODipine (NORVASC) 10 MG tablet Take 1 tablet (10 mg total) by mouth daily. 03/31/18  Yes Lahoma Crocker, MD  finasteride (PROSCAR) 5 MG tablet Take 1 tablet (5 mg total) by mouth daily. 03/31/18  Yes Lahoma Crocker, MD  hydrOXYzine (ATARAX/VISTARIL) 10 MG tablet Take 10 mg by mouth at bedtime. 03/21/18  Yes [provider]  lisinopril (ZESTRIL) 20 MG tablet Take 20 mg by mouth daily.   Yes [provider]  rosuvastatin (CRESTOR) 20 MG tablet Take 20 mg by mouth daily.   Yes [provider]  Vitamin D, Ergocalciferol, (DRISDOL) 1.25 MG (50000 UNIT) CAPS capsule Take 50,000 Units by mouth once a week. 01/15/21  Yes [provider]  cetirizine (ZYRTEC) 10 MG tablet Take 10 mg by mouth daily as needed for allergies. Patient not taking: Reported on 09/25/2022 03/21/18   [provider]  cyclobenzaprine (FLEXERIL) 10 MG tablet 10 mg 2 (two) times daily. Patient not taking: Reported on 09/25/2022 09/01/22   [provider]  famotidine (PEPCID) 20 MG tablet Take 1 tablet (20 mg total) by mouth daily for 10 days. Patient not taking: Reported on 03/13/2021 08/19/19 08/29/19  Osvaldo Shipper, MD  methocarbamol (ROBAXIN) 500 MG tablet Take 1 tablet (500 mg total) by  mouth 2 (two) times daily. 05/25/22   Blane Ohara, MD  traMADol (ULTRAM) 50 MG tablet Take 50 mg by mouth 2 (two) times daily as needed for moderate pain.  Patient not taking: Reported on 09/25/2022 03/07/18   [provider]    Current Outpatient Medications  Medication Sig Dispense Refill   allopurinol (ZYLOPRIM) 300 MG tablet Take 300 mg by mouth daily.  0   amLODipine (NORVASC) 10 MG tablet Take 1 tablet (10 mg total) by mouth daily. 30 tablet 0   finasteride (PROSCAR) 5 MG tablet Take 1 tablet (5 mg total) by mouth daily. 30 tablet 1   hydrOXYzine (ATARAX/VISTARIL) 10 MG tablet Take 10 mg by mouth at bedtime.  0   lisinopril (ZESTRIL) 20 MG tablet Take 20 mg by mouth daily.     rosuvastatin (CRESTOR) 20 MG tablet Take 20 mg by mouth daily.     Vitamin D, Ergocalciferol, (DRISDOL) 1.25 MG (50000 UNIT) CAPS capsule Take 50,000 Units by mouth once a week.     cetirizine (ZYRTEC) 10 MG tablet Take 10 mg by mouth daily as needed for allergies. (Patient not taking: Reported on 09/25/2022)  0   cyclobenzaprine (FLEXERIL) 10 MG tablet 10 mg 2 (two) times daily. (Patient not taking: Reported on 09/25/2022)     famotidine (PEPCID) 20 MG tablet Take 1 tablet (20 mg total) by mouth  daily for 10 days. (Patient not taking: Reported on 03/13/2021) 10 tablet 0   methocarbamol (ROBAXIN) 500 MG tablet Take 1 tablet (500 mg total) by mouth 2 (two) times daily. 10 tablet 0   traMADol (ULTRAM) 50 MG tablet Take 50 mg by mouth 2 (two) times daily as needed for moderate pain.  (Patient not taking: Reported on 09/25/2022)  0   No current facility-administered medications for this visit.    Allergies as of 10/16/2022   (No Known Allergies)    Family History  Problem Relation Age of Onset   Heart disease Mother    Lung cancer Father    Colon polyps Neg Hx    Colon cancer Neg Hx    Esophageal cancer Neg Hx    Rectal cancer Neg Hx    Stomach cancer Neg Hx     Social History   Socioeconomic  History   Marital status: Married    Spouse name: Not on file   Number of children: Not on file   Years of education: Not on file   Highest education level: Not on file  Occupational History   Not on file  Tobacco Use   Smoking status: Former    Types: Cigarettes    Quit date: 07/21/1977    Years since quitting: 45.2   Smokeless tobacco: Never  Vaping Use   Vaping Use: Never used  Substance and Sexual Activity   Alcohol use: Yes    Alcohol/week: 6.0 standard drinks of alcohol    Types: 6 Cans of beer per week   Drug use: No   Sexual activity: Not on file  Other Topics Concern   Not on file  Social History Narrative   Not on file   Social Determinants of Health   Financial Resource Strain: Not on file  Food Insecurity: Not on file  Transportation Needs: Not on file  Physical Activity: Not on file  Stress: Not on file  Social Connections: Not on file  Intimate Partner Violence: Not on file    Review of Systems: Positive for none All other review of systems negative except as mentioned in the HPI.  Physical Exam: Vital signs in last 24 hours: @VSRANGES @   General:   Alert,  Well-developed, well-nourished, pleasant and cooperative in NAD Lungs:  Clear throughout to auscultation.   Heart:  Regular rate and rhythm; no murmurs, clicks, rubs,  or gallops. Abdomen:  Soft, nontender and nondistended. Normal bowel sounds.   Neuro/Psych:  Alert and cooperative. Normal mood and affect. A and O x 3    No significant changes were identified.  The patient continues to be an appropriate candidate for the planned procedure and anesthesia.   Edman Circle, MD. [facility] 10/16/2022 12:22 PM@

## 2022-10-16 NOTE — Op Note (Signed)
[facility] Patient Name: Ricky Bailey Procedure Date: 10/16/2022 9:07 AM MRN: [id number] Endoscopist: Lynann Bologna , MD, [id number] Age: 67 Referring MD:  Date of Birth: Jun 24, 1956 Gender: Male Account #: [id number] Procedure:                Colonoscopy Indications:              Screening for colorectal malignant neoplasm Medicines:                Monitored Anesthesia Care Procedure:                Pre-Anesthesia Assessment:                           - Prior to the procedure, a History and Physical                            was performed, and patient medications and                            allergies were reviewed. The patient's tolerance of                            previous anesthesia was also reviewed. The risks                            and benefits of the procedure and the sedation                            options and risks were discussed with the patient.                            All questions were answered, and informed consent                            was obtained. Prior Anticoagulants: The patient has                            taken no anticoagulant or antiplatelet agents. ASA                            Grade Assessment: II - A patient with mild systemic                            disease. After reviewing the risks and benefits,                            the patient was deemed in satisfactory condition to                            undergo the procedure.                           After obtaining informed consent, the colonoscope  was passed under direct vision. Throughout the                            procedure, the patient's blood pressure, pulse, and                            oxygen saturations were monitored continuously. The                            CF [id number] #[id number] was introduced through the anus                            and advanced to the the cecum, identified by                            appendiceal orifice  and ileocecal valve. The                            colonoscopy was performed without difficulty. The                            patient tolerated the procedure well. The quality                            of the bowel preparation was fair. The terminal                            ileum, ileocecal valve, appendiceal orifice, and                            rectum were photographed. Scope In: 9:20:06 AM Scope Out: 9:30:14 AM Scope Withdrawal Time: 0 hours 6 minutes 28 seconds  Total Procedure Duration: 0 hours 10 minutes 8 seconds  Findings:                 A few medium-mouthed diverticula were found in the                            sigmoid colon and ascending colon.                           Non-bleeding internal hemorrhoids were found during                            retroflexion. The hemorrhoids were moderate and                            Grade I (internal hemorrhoids that do not prolapse).                           The terminal ileum appeared normal.                           The exam was otherwise without abnormality on  direct and retroflexion views. Complications:            No immediate complications. Estimated Blood Loss:     Estimated blood loss: none. Impression:               - Preparation of the colon was fair.                           - Diverticulosis in the sigmoid colon and in the                            ascending colon.                           - Non-bleeding internal hemorrhoids.                           - The examined portion of the ileum was normal.                           - The examination was otherwise normal on direct                            and retroflexion views.                           - No specimens collected. Recommendation:           - Patient has a contact number available for                            emergencies. The signs and symptoms of potential                            delayed complications were discussed with  the                            patient. Return to normal activities tomorrow.                            Written discharge instructions were provided to the                            patient.                           - Resume previous diet.                           - Continue present medications.                           - Repeat colonoscopy in 1 year for screening                            purposes with 2 day prep.                           -  The findings and recommendations were discussed                            with the patient's family. Lynann Bologna, MD 10/16/2022 9:38:53 AM This report has been signed electronically.

## 2022-10-16 NOTE — Progress Notes (Signed)
Uneventful anesthetic. Report to pacu rn. Vss. Care resumed by rn. 

## 2022-10-17 ENCOUNTER — Telehealth: Payer: Self-pay | Admitting: *Deleted

## 2022-10-17 NOTE — Telephone Encounter (Signed)
Mailbox full ,unable to leave message on f/u call 

## 2022-10-31 IMAGING — DX DG LUMBAR SPINE COMPLETE 4+V
6 series · 6 of 6 positions shown · non-contrast
Comparison: None.

CLINICAL DATA: Back pain for 3 days

EXAM:
LUMBAR SPINE - COMPLETE 4+ VIEW

[l-spine ap]
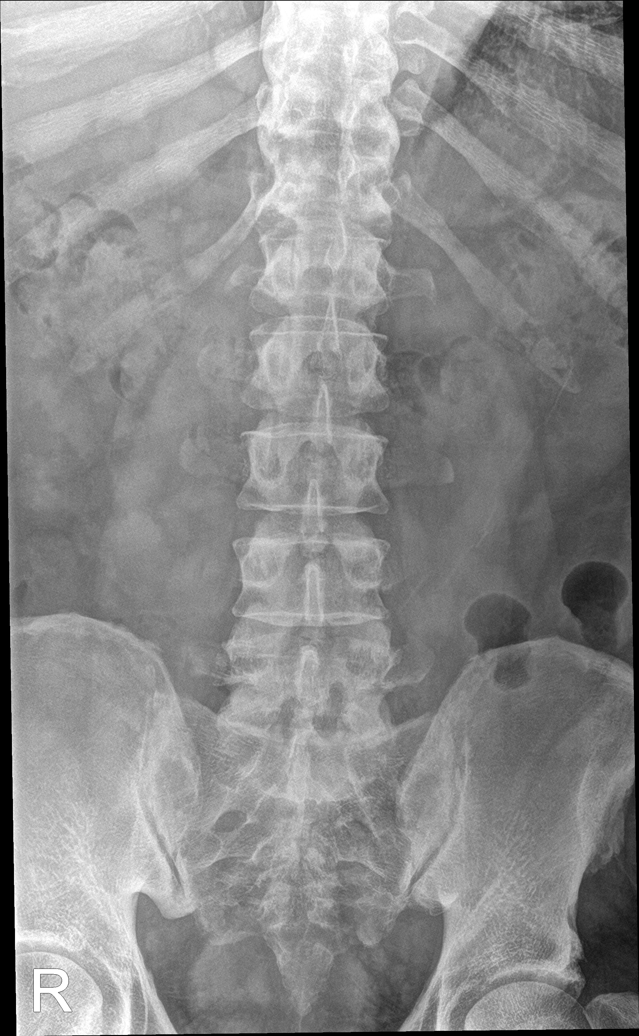

[l-spine obl (1 of 2)]
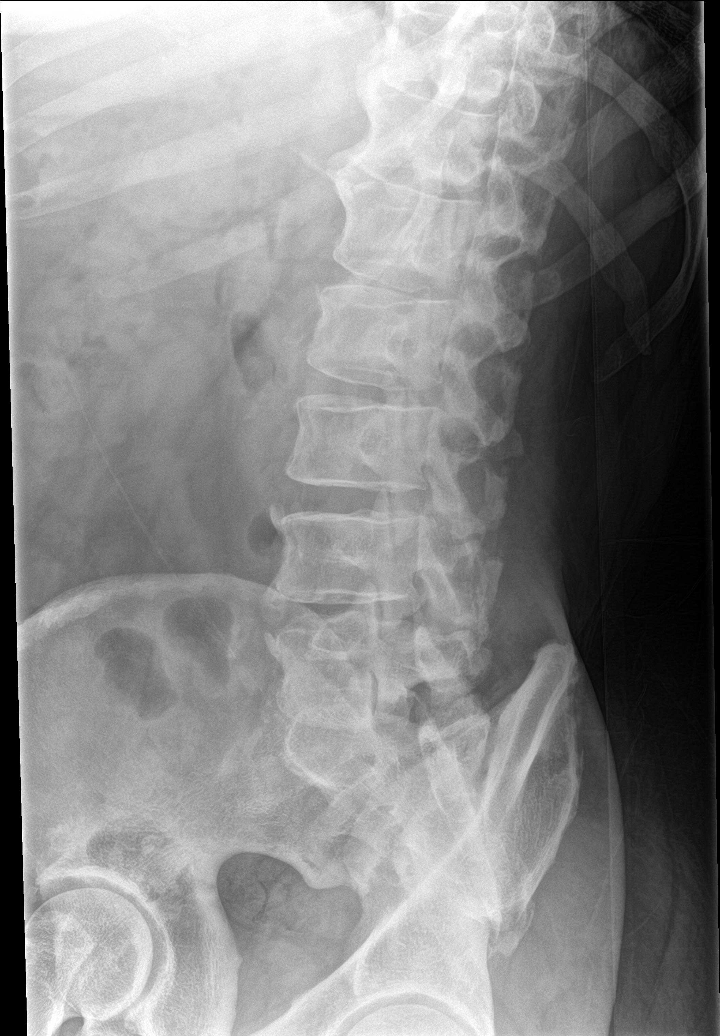

[l-spine obl (2 of 2)]
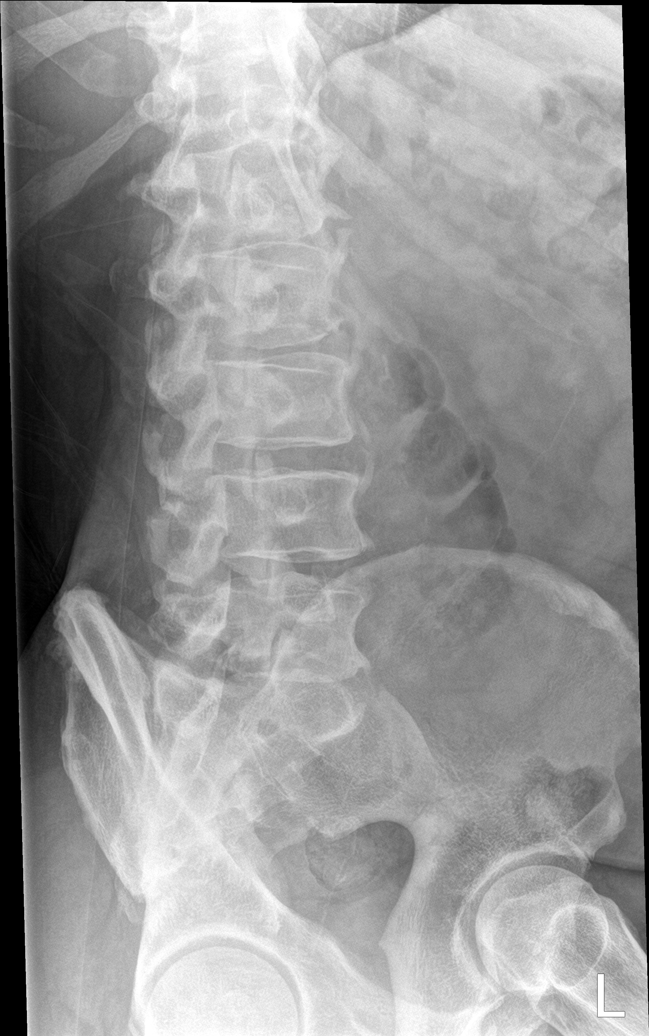

[l-spine lat]
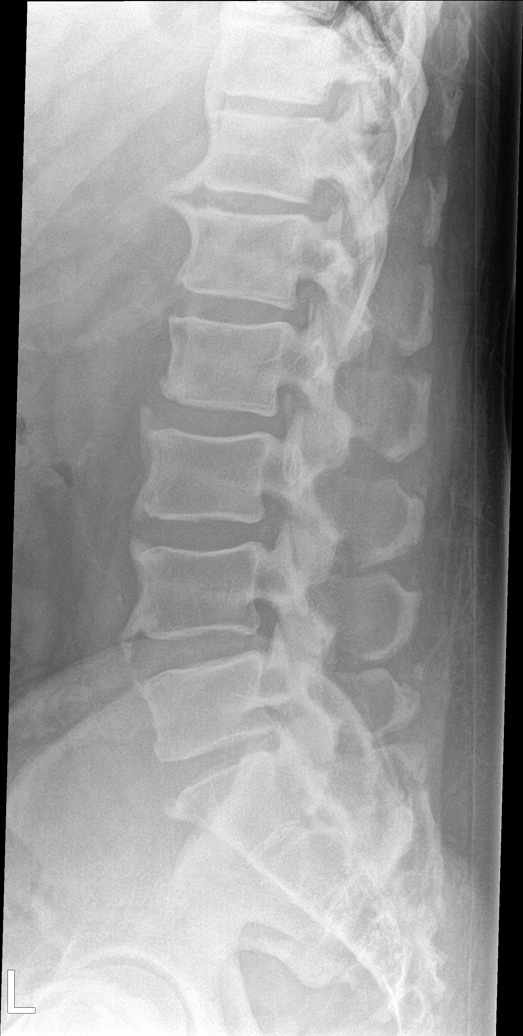

[l-spine spot (1 of 2)]
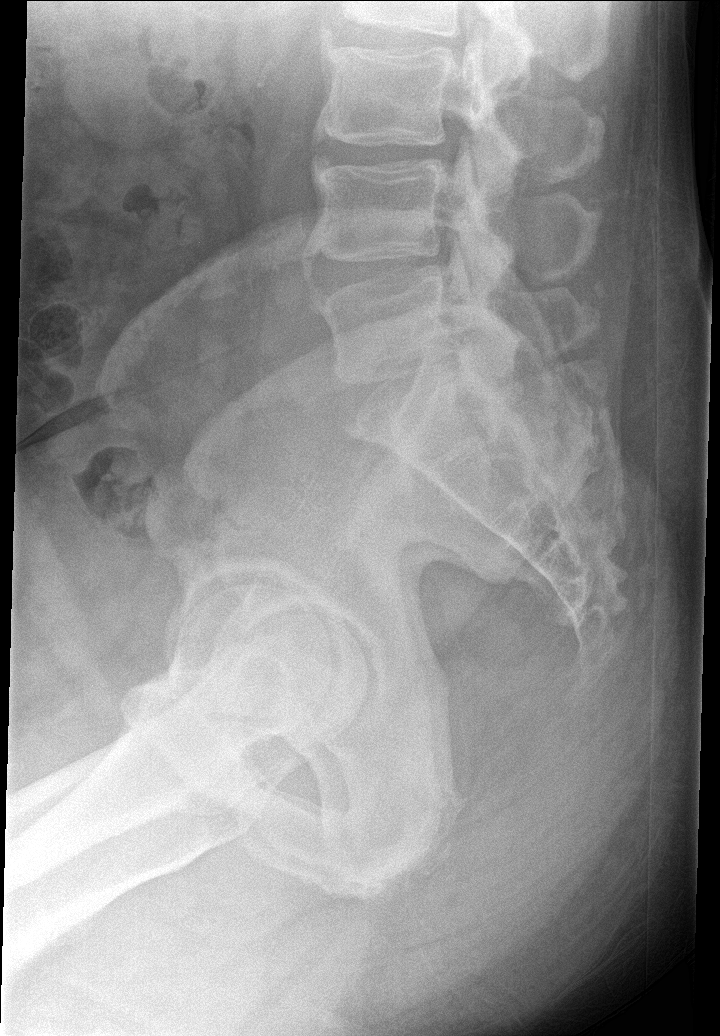

[l-spine spot (2 of 2)]
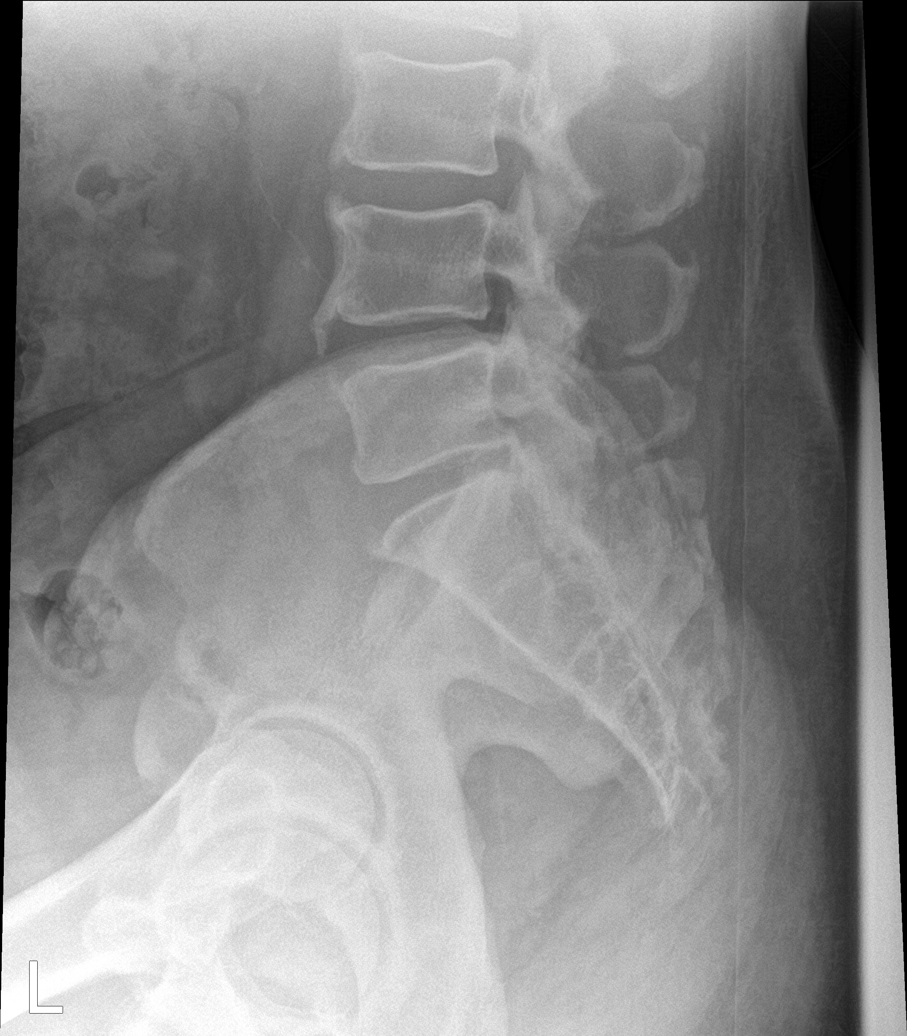

[6 of 6 positions shown; findings below may reference images not displayed]

FINDINGS: 5 nonrib bearing lumbar-type vertebral bodies.

Vertebral body heights are maintained. No acute fracture.

No static listhesis. No spondylolysis.

Degenerative disease with disc height loss at L5-S1. Small anterior
bridging osteophytes at T11-12, T12-L1, L3-4 and L4-5.

SI joints are unremarkable.
IMPRESSION: No acute osseous injury of the lumbar spine.

## 2023-02-08 ENCOUNTER — Emergency Department (HOSPITAL_COMMUNITY)
Admission: EM | Admit: 2023-02-08 | Discharge: 2023-02-09 | Disposition: A | Discharge status: Other Acute Inpatient Hospital | Payer: BC Managed Care – PPO | Source: Home / Self Care | Attending: Emergency Medicine | Admitting: Emergency Medicine

## 2023-02-08 ENCOUNTER — Other Ambulatory Visit: Payer: Self-pay

## 2023-02-08 ENCOUNTER — Encounter (HOSPITAL_COMMUNITY): Payer: Self-pay

## 2023-02-08 DIAGNOSIS — R451 Restlessness and agitation: Secondary | ICD-10-CM | POA: Insufficient documentation

## 2023-02-08 DIAGNOSIS — F1010 Alcohol abuse, uncomplicated: Secondary | ICD-10-CM | POA: Diagnosis present

## 2023-02-08 DIAGNOSIS — Z5900 Homelessness unspecified: Secondary | ICD-10-CM | POA: Insufficient documentation

## 2023-02-08 DIAGNOSIS — Z79899 Other long term (current) drug therapy: Secondary | ICD-10-CM | POA: Insufficient documentation

## 2023-02-08 DIAGNOSIS — R440 Auditory hallucinations: Secondary | ICD-10-CM | POA: Insufficient documentation

## 2023-02-08 DIAGNOSIS — F322 Major depressive disorder, single episode, severe without psychotic features: Secondary | ICD-10-CM | POA: Diagnosis present

## 2023-02-08 LAB — COMPREHENSIVE METABOLIC PANEL
ALT: 40 U/L (ref 0–44)
AST: 27 U/L (ref 15–41)
Albumin: 4.3 g/dL (ref 3.5–5.0)
Alkaline Phosphatase: 46 U/L (ref 38–126)
Anion gap: 10 (ref 5–15)
BUN: 10 mg/dL (ref 8–23)
CO2: 21 mmol/L — ABNORMAL LOW (ref 22–32)
Calcium: 9.4 mg/dL (ref 8.9–10.3)
Chloride: 107 mmol/L (ref 98–111)
Creatinine, Ser: 0.92 mg/dL (ref 0.61–1.24)
GFR, Estimated: >60 mL/min (ref >60)
Glucose, Bld: 94 mg/dL (ref 70–99)
Potassium: 3.6 mmol/L (ref 3.5–5.1)
Sodium: 138 mmol/L (ref 135–145)
Total Bilirubin: 1.3 mg/dL — ABNORMAL HIGH (ref 0.3–1.2)
Total Protein: 7.5 g/dL (ref 6.5–8.1)

## 2023-02-08 LAB — CBC
HCT: 45.4 % (ref 39.0–52.0)
Hemoglobin: 14.8 g/dL (ref 13.0–17.0)
MCH: 29.4 pg (ref 26.0–34.0)
MCHC: 32.6 g/dL (ref 30.0–36.0)
MCV: 90.3 fL (ref 80.0–100.0)
Platelets: 216 10*3/uL (ref 150–400)
RBC: 5.03 MIL/uL (ref 4.22–5.81)
RDW: 13.2 % (ref 11.5–15.5)
WBC: 5.9 10*3/uL (ref 4.0–10.5)
nRBC: 0 % (ref 0.0–0.2)

## 2023-02-08 LAB — ETHANOL: Alcohol, Ethyl (B): <10 mg/dL (ref <10)

## 2023-02-08 LAB — RAPID URINE DRUG SCREEN, HOSP PERFORMED
Amphetamines: NOT DETECTED
Barbiturates: NOT DETECTED
Benzodiazepines: NOT DETECTED
Cocaine: NOT DETECTED
Opiates: NOT DETECTED
Tetrahydrocannabinol: NOT DETECTED

## 2023-02-08 LAB — ACETAMINOPHEN LEVEL: Acetaminophen (Tylenol), Serum: <10 ug/mL — ABNORMAL LOW (ref 10–30)

## 2023-02-08 LAB — SALICYLATE LEVEL: Salicylate Lvl: <7 mg/dL — ABNORMAL LOW (ref 7.0–30.0)

## 2023-02-08 MED ORDER — METHOCARBAMOL 500 MG PO TABS: 500.0000 mg | ORAL_TABLET | Freq: Two times a day (BID) | ORAL | Status: DC

## 2023-02-08 MED ORDER — LOPERAMIDE HCL 2 MG PO CAPS: 2.0000 mg | ORAL_CAPSULE | ORAL | Status: DC | PRN

## 2023-02-08 MED ORDER — FINASTERIDE 5 MG PO TABS
5.0000 mg | ORAL_TABLET | Freq: Every day | ORAL | Status: DC
  Administered 2023-02-08: 5 mg via ORAL
  Filled 2023-02-08: qty 1

## 2023-02-08 MED ORDER — THIAMINE MONONITRATE 100 MG PO TABS: 100.0000 mg | ORAL_TABLET | Freq: Every day | ORAL | Status: DC

## 2023-02-08 MED ORDER — HYDROXYZINE HCL 25 MG PO TABS: 25.0000 mg | ORAL_TABLET | Freq: Four times a day (QID) | ORAL | Status: DC | PRN

## 2023-02-08 MED ORDER — ONDANSETRON 4 MG PO TBDP: 4.0000 mg | ORAL_TABLET | Freq: Four times a day (QID) | ORAL | Status: DC | PRN

## 2023-02-08 MED ORDER — THIAMINE HCL 100 MG/ML IJ SOLN
100.0000 mg | Freq: Once | INTRAMUSCULAR | Status: AC
  Administered 2023-02-08: 100 mg via INTRAMUSCULAR
  Filled 2023-02-08: qty 2

## 2023-02-08 MED ORDER — LISINOPRIL 20 MG PO TABS
20.0000 mg | ORAL_TABLET | Freq: Every day | ORAL | Status: DC
  Administered 2023-02-08: 20 mg via ORAL
  Filled 2023-02-08: qty 1

## 2023-02-08 MED ORDER — ROSUVASTATIN CALCIUM 5 MG PO TABS
20.0000 mg | ORAL_TABLET | Freq: Every day | ORAL | Status: DC
  Administered 2023-02-08: 20 mg via ORAL
  Filled 2023-02-08: qty 4

## 2023-02-08 MED ORDER — AMLODIPINE BESYLATE 5 MG PO TABS
10.0000 mg | ORAL_TABLET | Freq: Every day | ORAL | Status: DC
  Administered 2023-02-08: 10 mg via ORAL
  Filled 2023-02-08: qty 2

## 2023-02-08 MED ORDER — SERTRALINE HCL 50 MG PO TABS
50.0000 mg | ORAL_TABLET | Freq: Every day | ORAL | Status: DC
  Administered 2023-02-08: 50 mg via ORAL
  Filled 2023-02-08: qty 1

## 2023-02-08 MED ORDER — HYDROXYZINE HCL 10 MG PO TABS: 10.0000 mg | ORAL_TABLET | Freq: Every day | ORAL | Status: DC

## 2023-02-08 MED ORDER — ADULT MULTIVITAMIN W/MINERALS CH
1.0000 | ORAL_TABLET | Freq: Every day | ORAL | Status: DC
  Administered 2023-02-08: 1 via ORAL
  Filled 2023-02-08: qty 1

## 2023-02-08 MED ORDER — ALLOPURINOL 100 MG PO TABS
300.0000 mg | ORAL_TABLET | Freq: Every day | ORAL | Status: DC
  Administered 2023-02-08: 300 mg via ORAL
  Filled 2023-02-08: qty 3

## 2023-02-08 MED ORDER — LORAZEPAM 1 MG PO TABS: 1.0000 mg | ORAL_TABLET | Freq: Four times a day (QID) | ORAL | Status: DC | PRN

## 2023-02-08 NOTE — ED Provider Notes (Signed)
[place] [facility] Provider Note   CSN: [id number] Arrival date & time: 02/08/23  1450     History  Chief Complaint  Patient presents with   Homicidal    Branndon Divin is a 67 y.o. male.  HPI Patient presents with malevolent towards individuals as well as some auditory hallucination. Patient has no diagnosed history of psychiatric disease, but notes that he has been feeling particularly paranoid recently.  After an episode with a state office he felt more agitated than usual, has malevolent towards specific individuals, no thoughts of suicide, no physical pain.  He notes that he has been take his medication mostly as directed.     Home Medications Prior to Admission medications   Medication Sig Start Date End Date Taking? Authorizing Provider  allopurinol (ZYLOPRIM) 300 MG tablet Take 300 mg by mouth daily. 03/07/18   [provider]  amLODipine (NORVASC) 10 MG tablet Take 1 tablet (10 mg total) by mouth daily. 03/31/18   Lahoma Crocker, MD  cetirizine (ZYRTEC) 10 MG tablet Take 10 mg by mouth daily as needed for allergies. Patient not taking: Reported on 09/25/2022 03/21/18   [provider]  cyclobenzaprine (FLEXERIL) 10 MG tablet 10 mg 2 (two) times daily. Patient not taking: Reported on 09/25/2022 09/01/22   [provider]  famotidine (PEPCID) 20 MG tablet Take 1 tablet (20 mg total) by mouth daily for 10 days. Patient not taking: Reported on 03/13/2021 08/19/19 08/29/19  Osvaldo Shipper, MD  finasteride (PROSCAR) 5 MG tablet Take 1 tablet (5 mg total) by mouth daily. 03/31/18   Lahoma Crocker, MD  hydrOXYzine (ATARAX/VISTARIL) 10 MG tablet Take 10 mg by mouth at bedtime. 03/21/18   [provider]  lisinopril (ZESTRIL) 20 MG tablet Take 20 mg by mouth daily.    [provider]  methocarbamol (ROBAXIN) 500 MG tablet Take 1 tablet (500 mg total) by mouth 2 (two) times daily. 05/25/22   Blane Ohara, MD  rosuvastatin (CRESTOR) 20 MG tablet Take 20 mg by mouth daily.    [provider]  traMADol (ULTRAM) 50 MG tablet Take 50 mg by mouth 2 (two) times daily as needed for moderate pain.  Patient not taking: Reported on 09/25/2022 03/07/18   [provider]  Vitamin D, Ergocalciferol, (DRISDOL) 1.25 MG (50000 UNIT) CAPS capsule Take 50,000 Units by mouth once a week. 01/15/21   [provider]      Allergies    Patient has no known allergies.    Review of Systems   Review of Systems  All other systems reviewed and are negative.   Physical Exam Updated Vital Signs BP (!) 145/94 (BP Location: Right Arm)   Pulse 75   Temp 98.8 F (37.1 C) (Oral)   Resp 17   SpO2 98%  Physical Exam Vitals and nursing note reviewed.  Constitutional:      General: He is not in acute distress.    Appearance: He is well-developed.  HENT:     Head: Normocephalic and atraumatic.  Eyes:     Conjunctiva/sclera: Conjunctivae normal.  Cardiovascular:     Rate and Rhythm: Normal rate and regular rhythm.  Pulmonary:     Effort: Pulmonary effort is normal. No respiratory distress.     Breath sounds: No stridor.  Abdominal:     General: There is no distension.  Skin:    General: Skin is warm and dry.  Neurological:     Mental  Status: He is alert and oriented to person, place, and time.  Psychiatric:        Mood and Affect: Mood normal.     Comments: Agitation and malevolence towards specific individuals.     ED Results / Procedures / Treatments   Labs (all labs ordered are listed, but only abnormal results are displayed) Labs Reviewed  COMPREHENSIVE METABOLIC PANEL - Abnormal; Notable for the following components:      Result Value   CO2 21 (*)    Total Bilirubin 1.3 (*)    All other components within normal limits  SALICYLATE LEVEL - Abnormal; Notable for the following components:   Salicylate Lvl <7.0 (*)    All other components within normal limits   ACETAMINOPHEN LEVEL - Abnormal; Notable for the following components:   Acetaminophen (Tylenol), Serum <10 (*)    All other components within normal limits  ETHANOL  CBC  RAPID URINE DRUG SCREEN, HOSP PERFORMED    EKG None  Radiology No results found.  Procedures Procedures    Medications Ordered in ED Medications  amLODipine (NORVASC) tablet 10 mg (has no administration in time range)  allopurinol (ZYLOPRIM) tablet 300 mg (has no administration in time range)  finasteride (PROSCAR) tablet 5 mg (has no administration in time range)  hydrOXYzine (ATARAX) tablet 10 mg (has no administration in time range)  lisinopril (ZESTRIL) tablet 20 mg (has no administration in time range)  methocarbamol (ROBAXIN) tablet 500 mg (has no administration in time range)  rosuvastatin (CRESTOR) tablet 20 mg (has no administration in time range)    ED Course/ Medical Decision Making/ A&P                                 Medical Decision Making Adult male awake and alert hemodynamically unremarkable presents with malevolent thoughts towards a specific individuals, possibly after an uncomfortable office visit.  Patient has no history of psychosis, nor violence as far as discernible.  Patient medically cleared for behavioral health assistance with further efforts.  Amount and/or Complexity of Data Reviewed Labs: ordered. Decision-making details documented in ED Course.  Risk Prescription drug management. Decision regarding hospitalization. Diagnosis or treatment significantly limited by social determinants of health.  Final Clinical Impression(s) / ED Diagnoses Final diagnoses:  Agitation     Gerhard Munch, MD 02/08/23 1740

## 2023-02-08 NOTE — ED Notes (Signed)
Shift report received, assumed care of patient at this time 

## 2023-02-08 NOTE — ED Notes (Signed)
Pt up to use bathroom 

## 2023-02-08 NOTE — Consult Note (Addendum)
BH ED ASSESSMENT   Reason for Consult:  Psych Consult Referring Physician:   Gerhard Munch, MD   Patient Identification: Ricky Bailey MRN:  [id number] ED Chief Complaint: MDD (major depressive disorder), single episode, severe , no psychosis (HCC)  Diagnosis:  Principal Problem:   MDD (major depressive disorder), single episode, severe , no psychosis (HCC) Active Problems:   Alcohol abuse   ED Assessment Time Calculation: Start Time: 1745 Stop Time: 1816 Total Time in Minutes (Assessment Completion): 31   Subjective:    Ricky Bailey is a 67 y.o. AA male with a past brief and short psychiatric history of adjustment disorder with mixed emotional features that required subsequent hospitalization at [facility] in 2007, with pertinent medical comorbidities that include hypertension, obesity, BPPV, hypercholesteremia, and BPH, who presented this encounter by way of self for endorsements of worsening homicidal ideations and depression over the last several months. Patient currently voluntary and endorsed to be medically clear at this time per EDP team for psychiatric evaluation.  HPI:    Patient seen at [facility] emergency department for psychiatric face-to-face evaluation.  Upon evaluation, patient tells me that over the last approximate 4 months he has been becoming increasingly more depressed, anxious, and stressed due to overwhelming psychosocial stressors in his life.  Patient tells me that in April of this year at his job working as a Camera operator, he was given a drug test like usual that he is required to take that is random, only during this drug test he states that he was wrongfully given a false positive for the substance hydrocodone.   Patient endorses that because of testing positive for hydrocodone in his system during random drug screen, he was immediately by his company requested to provide records from his medical chart to support why this substance was in his system,  and when he did not get these records to his employer quick enough, he states that they fired him on the spot "wrongfully". 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   He endorses that specifically if he is not able to fight and win an appeal, he faces a continuation of his 3-year DOT ban on his ability to drive and provide for himself financially.  Patient endorses that because of losing his job and not being able to get another job other places, despite putting in many applications, he endorses that he has been becoming progressively more and more depressed over the last several months.  Patient describes that since the end of April he has been not eating well at all, hardly sleeping most nights due to stress, feeling lethargic, fatigued, more irritable, increasing his EtOH use, and having progressing and now overwhelming homicidal ideations towards his previous employer i.e. [employer].   Expanding on his homicidal ideations, he endorses that he has very frequently, and now more often, been having active homicidal ideations with a plan  to utilize his firearms to "shoot up the whole establishment" at his previous employer's location. Patient states that he is not specifically mad at any individual person, but he wants all of the people at the company to "feel my pain". Patient describes that because of losing his job, he  faces homelessness, increasing debt, difficulties with providing food and other necessities that he needs, and pay for his medications that he takes regularly for his medical comorbidities.    Patient endorses that despite how he has been feeling, he has not been having any suicidal ideations, denies any past history of suicide attempts and/or self injures behavior.  Patient endorses firmly and adamantly that he does not have any history of drug use and/or tobacco use, but does drink occasionally, and states that since all of this has been happening, he has been drinking most days during the week since about May. Patient endorses that most days because he is not working, he is often lately spending his time having a couple beers (24 pack a week), "maybe a glass of vodka", and drinking " a glass of wine" regularly.   Patient feels that he has not drastically increased his drinking, as he states that he has not drank over the last 3 to 4 days and is not having any withdrawals, but that he does agree that it has picked up pretty significantly into risky use and or abuse. Patient endorses that he has never had any alcohol withdraw symptoms and/or delirium tremens. Patient endorses one previous mental health hospitalization in 2007 for stresses related to dealing with his son when he was younger.  Patient endorses no outpatient therapy and/or mental health services and/or mental health medications utilized over the years.  Patient endorses that due to the stress he has been feeling, and utilizing alcohol, sometimes he has been feeling like he has been having very intense internal dialogue of trying to cope with his job loss and not doing "something stupid" like shoot up his previous employers location. Patient states through evaluation that he is not having auditory visual hallucinations, and objectively, does not appear to be presenting with psychotic features and/or responding to internal stimuli.  Patient endorses  that he is amenable to treatment, wants medications to help with his depression and the stress he has been feeling.  Patient reports that he wants inpatient hospitalization to "pull myself together".  Past Psychiatric History: [facility] (2007) adjustment disorder with mixed emotional features  Risk to Self or Others: Is the patient at risk to self? No Has the patient been a risk to self in the past 6 months? No Has the patient been a risk to self within the distant past? No Is the patient a risk to others? Yes Has the patient been a risk to others in the past 6 months? No Has the patient been a risk to others within the distant past? Yes  [place] Scale:  Flowsheet Row ED from 02/08/2023 in [facility] Emergency Department at [facility] ED from 05[facility]28/2022 in [facility] Emergency Department at [facility] ED from 05/25/2022 in [facility] Emergency Department at [facility]  C-SSRS RISK CATEGORY No Risk No Risk No Risk       Substance Abuse: ETOH, denies other drug or tobacco use  Past Medical History:  Past Medical History:  Diagnosis Date   Back pain    BPH (benign prostatic hyperplasia)    Gout    Hyperlipidemia    Hypertension  Obesity    Sleep apnea    Vertigo 03/28/2018   with vommiting    Past Surgical History:  Procedure Laterality Date   BACK SURGERY     Family History:  Family History  Problem Relation Age of Onset   Heart disease Mother    Lung cancer Father    Colon polyps Neg Hx    Colon cancer Neg Hx    Esophageal cancer Neg Hx    Rectal cancer Neg Hx    Stomach cancer Neg Hx    Family Psychiatric  History: None endorsed  Social History:  Social History   Substance and Sexual Activity  Alcohol Use Yes   Alcohol/week: 6.0 standard drinks of alcohol   Types: 6 Cans of beer per week     Social History   Substance and Sexual Activity  Drug Use No    Social History   Socioeconomic History   Marital status: Married     Spouse name: Not on file   Number of children: Not on file   Years of education: Not on file   Highest education level: Not on file  Occupational History   Not on file  Tobacco Use   Smoking status: Former    Current packs/day: 0.00    Types: Cigarettes    Quit date: 07/21/1977    Years since quitting: 45.5   Smokeless tobacco: Never  Vaping Use   Vaping status: Never Used  Substance and Sexual Activity   Alcohol use: Yes    Alcohol/week: 6.0 standard drinks of alcohol    Types: 6 Cans of beer per week   Drug use: No   Sexual activity: Not on file  Other Topics Concern   Not on file  Social History Narrative   Not on file   Social Determinants of Health   Financial Resource Strain: Not on file  Food Insecurity: Not on file  Transportation Needs: Not on file  Physical Activity: Not on file  Stress: Not on file  Social Connections: Unknown (11/20/2021)   Received from [facility]   Social Network    Social Network: Not on file   Additional Social History:    Allergies:  No Known Allergies  Labs:  Results for orders placed or performed during the hospital encounter of 02/08/23 (from the past 48 hour(s))  Comprehensive metabolic panel     Status: Abnormal   Collection Time: 02/08/23  3:42 PM  Result Value Ref Range   Sodium 138 135 - 145 mmol/L   Potassium 3.6 3.5 - 5.1 mmol/L   Chloride 107 98 - 111 mmol/L   CO2 21 (L) 22 - 32 mmol/L   Glucose, Bld 94 70 - 99 mg/dL    Comment: Glucose reference range applies only to samples taken after fasting for at least 8 hours.   BUN 10 8 - 23 mg/dL   Creatinine, Ser 1.61 0.61 - 1.24 mg/dL   Calcium 9.4 8.9 - 09.6 mg/dL   Total Protein 7.5 6.5 - 8.1 g/dL   Albumin 4.3 3.5 - 5.0 g/dL   AST 27 15 - 41 U/L   ALT 40 0 - 44 U/L   Alkaline Phosphatase 46 38 - 126 U/L   Total Bilirubin 1.3 (H) 0.3 - 1.2 mg/dL   GFR, Estimated >04 >54 mL/min    Comment: (NOTE) Calculated using the CKD-EPI Creatinine Equation  (2021)    Anion gap 10 5 - 15    Comment: Performed at [facility], [address] [place], [place] [address]  Ethanol     Status: None   Collection Time: 02/08/23  3:42 PM  Result Value Ref Range   Alcohol, Ethyl (B) <10 <10 mg/dL    Comment: (NOTE) Lowest detectable limit for serum alcohol is 10 mg/dL.  For medical purposes only. Performed at [facility], [address], [place], [place] [address]   Salicylate level     Status: Abnormal   Collection Time: 02/08/23  3:42 PM  Result Value Ref Range   Salicylate Lvl <7.0 (L) 7.0 - 30.0 mg/dL    Comment: Performed at [facility], [address] [place], [place] [address]  Acetaminophen level     Status: Abnormal   Collection Time: 02/08/23  3:42 PM  Result Value Ref Range   Acetaminophen (Tylenol), Serum <10 (L) 10 - 30 ug/mL    Comment: (NOTE) Therapeutic concentrations vary significantly. A range of 10-30 ug/mL  may be an effective concentration for many patients. However, some  are best treated at concentrations outside of this range. Acetaminophen concentrations >150 ug/mL at 4 hours after ingestion  and >50 ug/mL at 12 hours after ingestion are often associated with  toxic reactions.  Performed at [facility], [address] [place], [place] [address]   cbc     Status: None   Collection Time: 02/08/23  3:42 PM  Result Value Ref Range   WBC 5.9 4.0 - 10.5 K/uL   RBC 5.03 4.22 - 5.81 MIL/uL   Hemoglobin 14.8 13.0 - 17.0 g/dL   HCT 44.0 10.2 - 72.5 %   MCV 90.3 80.0 - 100.0 fL   MCH 29.4 26.0 - 34.0 pg   MCHC 32.6 30.0 - 36.0 g/dL   RDW 36.6 44.0 - 34.7 %   Platelets 216 150 - 400 K/uL   nRBC 0.0 0.0 - 0.2 %    Comment: Performed at [facility], [address] [place], [place] [address]  Rapid urine drug screen (hospital performed)     Status: None   Collection Time: 02/08/23  4:22 PM  Result Value Ref Range   Opiates NONE DETECTED NONE DETECTED   Cocaine NONE DETECTED NONE  DETECTED   Benzodiazepines NONE DETECTED NONE DETECTED   Amphetamines NONE DETECTED NONE DETECTED   Tetrahydrocannabinol NONE DETECTED NONE DETECTED   Barbiturates NONE DETECTED NONE DETECTED    Comment: (NOTE) DRUG SCREEN FOR MEDICAL PURPOSES ONLY.  IF CONFIRMATION IS NEEDED FOR ANY PURPOSE, NOTIFY LAB WITHIN 5 DAYS.  LOWEST DETECTABLE LIMITS FOR URINE DRUG SCREEN Drug Class                     Cutoff (ng/mL) Amphetamine and metabolites    1000 Barbiturate and metabolites    200 Benzodiazepine                 200 Opiates and metabolites        300 Cocaine and metabolites        300 THC                            50 Performed at [facility], [address] [place], [place] [address]     Current Facility-Administered Medications  Medication Dose Route Frequency Provider Last Rate Last Admin   allopurinol (ZYLOPRIM) tablet 300 mg  300 mg Oral Daily Gerhard Munch, MD  amLODipine (NORVASC) tablet 10 mg  10 mg Oral Daily Gerhard Munch, MD       finasteride (PROSCAR) tablet 5 mg  5 mg Oral Daily Gerhard Munch, MD       hydrOXYzine (ATARAX) tablet 10 mg  10 mg Oral QHS Gerhard Munch, MD       lisinopril (ZESTRIL) tablet 20 mg  20 mg Oral Daily Gerhard Munch, MD       methocarbamol (ROBAXIN) tablet 500 mg  500 mg Oral BID Gerhard Munch, MD       rosuvastatin (CRESTOR) tablet 20 mg  20 mg Oral Daily Gerhard Munch, MD       Current Outpatient Medications  Medication Sig Dispense Refill   allopurinol (ZYLOPRIM) 300 MG tablet Take 300 mg by mouth daily.  0   amLODipine (NORVASC) 10 MG tablet Take 1 tablet (10 mg total) by mouth daily. 30 tablet 0   cetirizine (ZYRTEC) 10 MG tablet Take 10 mg by mouth daily as needed for allergies. (Patient not taking: Reported on 09/25/2022)  0   cyclobenzaprine (FLEXERIL) 10 MG tablet 10 mg 2 (two) times daily. (Patient not taking: Reported on 09/25/2022)     famotidine (PEPCID) 20 MG tablet Take 1 tablet (20 mg total) by  mouth daily for 10 days. (Patient not taking: Reported on 03/13/2021) 10 tablet 0   finasteride (PROSCAR) 5 MG tablet Take 1 tablet (5 mg total) by mouth daily. 30 tablet 1   hydrOXYzine (ATARAX/VISTARIL) 10 MG tablet Take 10 mg by mouth at bedtime.  0   lisinopril (ZESTRIL) 20 MG tablet Take 20 mg by mouth daily.     methocarbamol (ROBAXIN) 500 MG tablet Take 1 tablet (500 mg total) by mouth 2 (two) times daily. 10 tablet 0   rosuvastatin (CRESTOR) 20 MG tablet Take 20 mg by mouth daily.     traMADol (ULTRAM) 50 MG tablet Take 50 mg by mouth 2 (two) times daily as needed for moderate pain.  (Patient not taking: Reported on 09/25/2022)  0   Vitamin D, Ergocalciferol, (DRISDOL) 1.25 MG (50000 UNIT) CAPS capsule Take 50,000 Units by mouth once a week.      Musculoskeletal: Strength & Muscle Tone: within normal limits Gait & Station: normal Patient leans: N/A   Psychiatric Specialty Exam: Presentation  General Appearance:  Appropriate for Environment; Fairly Groomed  Eye Contact: Poor  Speech: Clear and Coherent; Normal Rate  Speech Volume: Normal  Handedness: Right   Mood and Affect  Mood: Dysphoric; Depressed; Angry  Affect: Other (comment) (Neutral if mildly constricted)   Thought Process  Thought Processes: Linear; Goal Directed; Coherent  Descriptions of Associations:Intact  Orientation:Full (Time, Place and Person)  Thought Content:Logical  History of Schizophrenia/Schizoaffective disorder:No data recorded Duration of Psychotic Symptoms:No data recorded Hallucinations:Hallucinations: None  Ideas of Reference:None  Suicidal Thoughts:Suicidal Thoughts: No  Homicidal Thoughts:Homicidal Thoughts: Yes, Active HI Active Intent and/or Plan: With Intent; With Plan; With Means to Carry Out; With Access to Means   Sensorium  Memory: Immediate Good; Recent Good; Remote Good  Judgment: Intact  Insight: Fair   Art therapist   Concentration: Fair  Attention Span: Fair  Recall: Jennelle Human of Knowledge: Fair  Language: Fair   Psychomotor Activity  Psychomotor Activity: Psychomotor Activity: Normal   Assets  Assets: Housing; Physical Health; Resilience; Leisure Time; Transportation; Vocational/Educational; Health and safety inspector; Desire for Improvement; Talents/Skills; Manufacturing systems engineer; Social Support    Sleep  Sleep: Sleep: Poor   Physical Exam: Physical Exam Vitals and  nursing note reviewed.  Constitutional:      General: He is not in acute distress.    Appearance: He is obese. He is not ill-appearing, toxic-appearing or diaphoretic.  Pulmonary:     Effort: Pulmonary effort is normal.  Neurological:     Mental Status: He is alert and oriented to person, place, and time.  Psychiatric:        Attention and Perception: Attention and perception normal. He is attentive. He does not perceive auditory or visual hallucinations.        Mood and Affect: Mood is depressed (Dysphoric, angry).        Speech: Speech normal.        Behavior: Behavior is agitated. Behavior is not slowed, aggressive, withdrawn, hyperactive or combative. Behavior is cooperative.        Thought Content: Thought content is not delusional. Thought content includes homicidal (Reports wanting to "shoot up" [employer]) ideation. Thought content does not include suicidal ideation.        Cognition and Memory: Cognition and memory normal.        Judgment: Judgment is impulsive.    Review of Systems  Neurological:  Negative for tremors.  Psychiatric/Behavioral:  Positive for depression and substance abuse (ETOH). Negative for hallucinations and suicidal ideas. The patient is nervous/anxious and has insomnia.   All other systems reviewed and are negative.  Blood pressure (!) 145/94, pulse 75, temperature 98.8 F (37.1 C), temperature source Oral, resp. rate 17, SpO2 98%. There is no height or weight on file  to calculate BMI.  Medical Decision Making:  Diagnostically, the patient presents with symptomology that is most consistent at this time with unipolar major depression without psychotic features. Given the intensity and active nature of the patient's homicidal ideations towards his previous employer, with active means i.e. guns, a plan, and intent, discussed with the patient that inpatient hospitalization would be recommended, but if he should choose to leave, involuntary commitment would be taken out for his safety and others. Discussed with patient pharmacological intervention to help with the symptoms that he is feeling, to which patient endorsed he was amenable, had no questions or concerns after review. Patient endorses significant increase in his EtOH use since about May, will implement CIWA scoring and protocols for safety. Psychiatry will continue to follow the patient until disposition is obtained, CSW team actively seeking disposition at this time.  Recommendations  #MDD (major depressive disorder), single episode, severe , no psychosis (HCC) #Alcohol abuse  -Zoloft 50 mg p.o. once daily -Inpatient hospitalization for mental health -Recommend CIWA monitoring and care measures -Recommend IVC, if patient should choose to leave   Disposition: Recommend psychiatric Inpatient admission when medically cleared.  Lenox Ponds, NP 02/08/2023 6:16 PM

## 2023-02-08 NOTE — ED Notes (Addendum)
Pt's belongings placed in locker 2. 

## 2023-02-08 NOTE — ED Triage Notes (Signed)
Pt arrives via POV. Pt states he has been having homicidal thoughts for the past few months. States he has moments he wants to take his guns and just start shooting everyone. Pt denies SI. Pt states he feels like he has two people on his shoulders, the devil and someone else, telling him what to do. Pt is currently calm and cooperative.

## 2023-02-08 NOTE — Progress Notes (Addendum)
Pt was accepted to [facility] TODAY   02/08/2023; Bed Assignment  L34. Address: [address] [place], [place], [place] [address]  EKG, and signed voluntary consent to [facility] faxed [phone].   Pt meets inpatient criteria per Shearon Stalls  Attending Physician will be Dr. Shellee Milo  Phone number for report: [phone]   Pt can arrive after: [facility] and [facility] Charge RN to coordinate after conformation on PENDING items.  Care Team notified: Night [facility] Edythe Clarity, RN, Day [facility] Danika Darden Palmer [place], Whitney King, Agapito Games, Alex Rohr,RN,[facility], LCSWA 02/08/2023 @ 9:37 PM

## 2023-02-09 ENCOUNTER — Inpatient Hospital Stay
Admission: AD | Admit: 2023-02-09 | Discharge: 2023-02-15 | DRG: 885 | Disposition: A | Discharge status: Home / Self Care | Payer: Medicare Other | Source: Intra-hospital | Attending: Psychiatry | Admitting: Psychiatry

## 2023-02-09 ENCOUNTER — Encounter: Payer: Self-pay | Admitting: Psychiatry

## 2023-02-09 DIAGNOSIS — Z8249 Family history of ischemic heart disease and other diseases of the circulatory system: Secondary | ICD-10-CM | POA: Diagnosis not present

## 2023-02-09 DIAGNOSIS — R45851 Suicidal ideations: Secondary | ICD-10-CM | POA: Diagnosis present

## 2023-02-09 DIAGNOSIS — N400 Enlarged prostate without lower urinary tract symptoms: Secondary | ICD-10-CM | POA: Diagnosis present

## 2023-02-09 DIAGNOSIS — I10 Essential (primary) hypertension: Secondary | ICD-10-CM | POA: Diagnosis present

## 2023-02-09 DIAGNOSIS — F322 Major depressive disorder, single episode, severe without psychotic features: Principal | ICD-10-CM | POA: Diagnosis present

## 2023-02-09 DIAGNOSIS — Z87891 Personal history of nicotine dependence: Secondary | ICD-10-CM

## 2023-02-09 MED ORDER — HALOPERIDOL LACTATE 5 MG/ML IJ SOLN: 5.0000 mg | Freq: Three times a day (TID) | INTRAMUSCULAR | Status: DC | PRN

## 2023-02-09 MED ORDER — LORAZEPAM 1 MG PO TABS: 1.0000 mg | ORAL_TABLET | Freq: Four times a day (QID) | ORAL | Status: AC | PRN

## 2023-02-09 MED ORDER — ALUM & MAG HYDROXIDE-SIMETH 200-200-20 MG/5ML PO SUSP: 30.0000 mL | ORAL | Status: DC | PRN

## 2023-02-09 MED ORDER — ONDANSETRON 4 MG PO TBDP: 4.0000 mg | ORAL_TABLET | Freq: Four times a day (QID) | ORAL | Status: AC | PRN

## 2023-02-09 MED ORDER — TRAZODONE HCL 50 MG PO TABS
50.0000 mg | ORAL_TABLET | Freq: Every evening | ORAL | Status: DC | PRN
  Administered 2023-02-09 – 2023-02-13 (×5): 50 mg via ORAL
  Filled 2023-02-09 (×5): qty 1

## 2023-02-09 MED ORDER — LOPERAMIDE HCL 2 MG PO CAPS: 2.0000 mg | ORAL_CAPSULE | ORAL | Status: AC | PRN

## 2023-02-09 MED ORDER — FINASTERIDE 5 MG PO TABS
5.0000 mg | ORAL_TABLET | Freq: Every day | ORAL | Status: DC
  Administered 2023-02-09 – 2023-02-15 (×7): 5 mg via ORAL
  Filled 2023-02-09 (×7): qty 1

## 2023-02-09 MED ORDER — ADULT MULTIVITAMIN W/MINERALS CH
1.0000 | ORAL_TABLET | Freq: Every day | ORAL | Status: DC
  Administered 2023-02-09 – 2023-02-15 (×7): 1 via ORAL
  Filled 2023-02-09 (×7): qty 1

## 2023-02-09 MED ORDER — ROSUVASTATIN CALCIUM 5 MG PO TABS
5.0000 mg | ORAL_TABLET | Freq: Every day | ORAL | Status: DC
  Administered 2023-02-09 – 2023-02-15 (×7): 5 mg via ORAL
  Filled 2023-02-09 (×7): qty 1

## 2023-02-09 MED ORDER — ACETAMINOPHEN 325 MG PO TABS: 650.0000 mg | ORAL_TABLET | Freq: Four times a day (QID) | ORAL | Status: DC | PRN

## 2023-02-09 MED ORDER — HALOPERIDOL 5 MG PO TABS: 5.0000 mg | ORAL_TABLET | Freq: Three times a day (TID) | ORAL | Status: DC | PRN

## 2023-02-09 MED ORDER — HYDROXYZINE HCL 25 MG PO TABS
25.0000 mg | ORAL_TABLET | Freq: Four times a day (QID) | ORAL | Status: AC | PRN
  Administered 2023-02-09 – 2023-02-10 (×2): 25 mg via ORAL
  Filled 2023-02-09 (×2): qty 1

## 2023-02-09 MED ORDER — MAGNESIUM HYDROXIDE 400 MG/5ML PO SUSP
30.0000 mL | Freq: Every day | ORAL | Status: DC | PRN
  Administered 2023-02-11: 30 mL via ORAL
  Filled 2023-02-09: qty 30

## 2023-02-09 MED ORDER — SERTRALINE HCL 25 MG PO TABS
25.0000 mg | ORAL_TABLET | Freq: Every day | ORAL | Status: DC
  Administered 2023-02-09 – 2023-02-10 (×2): 25 mg via ORAL
  Filled 2023-02-09 (×2): qty 1

## 2023-02-09 MED ORDER — ALLOPURINOL 100 MG PO TABS
150.0000 mg | ORAL_TABLET | Freq: Every day | ORAL | Status: DC
  Administered 2023-02-09 – 2023-02-15 (×7): 150 mg via ORAL
  Filled 2023-02-09 (×7): qty 2

## 2023-02-09 NOTE — Progress Notes (Signed)
Patient escorted to unit walking independently without use of assistive device. Skin checked performed with second RN, Darreld Mclean. One bag of personal possessions was with patient at time of arrival. Patient signed and verified stored items inventory sheet. Personal items stored in locker 5.

## 2023-02-09 NOTE — Tx Team (Signed)
Initial Treatment Plan 02/09/2023 5:48 AM Selinda Orion [id number]    PATIENT STRESSORS: Financial difficulties   Loss of work     PATIENT STRENGTHS: Ability for insight  Capable of independent living  Printmaker for treatment/growth  Supportive family/friends    PATIENT IDENTIFIED PROBLEMS: Homicidal ideation  Depression  Anxiety                 DISCHARGE CRITERIA:  Improved stabilization in mood, thinking, and/or behavior Verbal commitment to aftercare and medication compliance  PRELIMINARY DISCHARGE PLAN: Outpatient therapy Return to previous work or school arrangements  PATIENT/FAMILY INVOLVEMENT: This treatment plan has been presented to and reviewed with the patient, Ricky Bailey. The patient and family have been given the opportunity to ask questions and make suggestions.  Foster Simpson, RN 02/09/2023, 5:48 AM

## 2023-02-09 NOTE — Progress Notes (Signed)
Admission Note:  67 year old patient admitted to the unit voluntarily for HI and mental health. Upon approach, pt is calm and cooperative. Flat affect Speech clear. PH/O Gout, HTN, GERD, and Sleep Apnea. Reports getting fired 4 months ago due to positive drug testing at [employer]. Pt said "I haven't been able to find full time employment and completed 15 job applications been denied for work. States he's very angry with [employer] for firing him under false drug testing. Pt said "I never tested positive for drugs in my life!" Says those people ruined my life and I wanted to ruin there's that's why I'm here now. Support and encouragement provided. Reports drinking 4-5 12 oz beers every other day. CIWA protocol initiated. Skin checked with another nurse. Old scarring to left upper arm. Dry crusty heels and toes noted. Skin warm, dry and intact. Pt searched and no contraband found. POC and unit policies explained. Verbalized understanding. Consents obtained. Food and fluids offered and accepted. No voiced thoughts of hurting himself. No AV/H. Q 15 min checks maintained for safety. Pt had no questions/concerns.

## 2023-02-09 NOTE — Group Note (Signed)
Recreation Therapy Group Note   Group Topic:Other  Group Date: 02/09/2023 Start Time: 0900 End Time: 0955 Facilitators: Rosina Lowenstein, LRT, CTRS Location: Courtyard  Group Description: Music Reminisce. LRT encouraged patients to think of their favorite song(s) that reminded them of a positive memory or time in their life. LRT encouraged patient to talk about that memory aloud to the group. LRT played the song through a speaker for all to hear. LRT and patients discussed how thinking of a positive memory or time in their life can be used as a coping skill in everyday life post discharge.    Goal Area(s) Addressed: Patient will increase verbal communication by conversing with peers. Patient will contribute to group discussion with minimal prompting. Patient will reminisce a positive memory or moment in their life.    Affect/Mood: Flat   Participation Level: Minimal   Participation Quality: Independent   Behavior: Appropriate and Guarded   Speech/Thought Process: Coherent   Insight: Fair   Judgement: Fair    Modes of Intervention: Activity   Patient Response to Interventions:  Receptive   Education Outcome:  In group clarification offered    Clinical Observations/Individualized Feedback: Ricky Bailey was minimally active in their participation of session activities and group discussion. Pt identified "I like blues and jazz music". Pt sat with his back turned from the group during the whole session. Pt didn't interact with LRT or peers unless asked a direct question.    Plan: Continue to engage patient in RT group sessions 2-3x/week.   Rosina Lowenstein, LRT, CTRS 02/09/2023 11:57 AM

## 2023-02-09 NOTE — ED Notes (Signed)
Patient belongings not inventoried at time belongings were secured. This Rn inventoried belongings at this time.

## 2023-02-09 NOTE — Plan of Care (Signed)
  Problem: Coping: Goal: Level of anxiety will decrease Outcome: Progressing   Problem: Elimination: Goal: Will not experience complications related to bowel motility Outcome: Progressing Goal: Will not experience complications related to urinary retention Outcome: Progressing   

## 2023-02-09 NOTE — Progress Notes (Addendum)
Patient admitted this morning for homicidal thoughts towards employer after recent termination with a diagnosis of major depressive disorder. He denies SI/AVH. He endorses HI toward employer without a plan. He denies anxiety but endorses mild depression. Patient participated in 2/3 groups. Adherent to taking medications.  Denies pain. Patient has no complaints. He is on a CIWA scoring 0 twice.  Q15 minute unit checks in place.

## 2023-02-09 NOTE — H&P (Signed)
Psychiatric Admission Assessment Adult  Patient Identification: Ricky Bailey MRN:  [id number] Date of Evaluation:  02/09/2023 Chief Complaint:  MDD (major depressive disorder), single episode, severe , no psychosis (HCC) [F32.2] Principal Diagnosis: MDD (major depressive disorder), single episode, severe , no psychosis (HCC) Diagnosis:  Principal Problem:   MDD (major depressive disorder), single episode, severe , no psychosis (HCC)  History of Present Illness: Ricky Bailey is a 67 year old African-American male who was voluntarily admitted to inpatient psychiatry.  Ricky Bailey lost his job recently after testing positive for opioids.  He says that it was an inaccurate test and he had 4 subsequent tests that were negative.  He has been a Naval architect for 20 years and this has really made him angry.  Denies any depression or auditory or visual hallucinations.  He does not have any psychiatric history.  He denies any suicidal or homicidal ideation.  He states that he is just angry.  He says that the trazodone does help him sleep.  He was started on Zoloft and took it so I will just leave it on board. Associated Signs/Symptoms: Depression Symptoms:  psychomotor agitation, (Hypo) Manic Symptoms:   None Anxiety Symptoms:   None Psychotic Symptoms:   None PTSD Symptoms: NA Total Time spent with patient: 1 hour  Past Psychiatric History: None  Is the patient at risk to self? No.  Has the patient been a risk to self in the past 6 months? No.  Has the patient been a risk to self within the distant past? No.  Is the patient a risk to others? No.  Has the patient been a risk to others in the past 6 months? No.  Has the patient been a risk to others within the distant past? No.   [place] Scale:  Flowsheet Row Admission (Current) from 02/09/2023 in [facility] BEH[facility]ICINE ED from 02/08/2023 in [facility] Emergency Department at [facility] ED from 05/28/2022 in [facility] Emergency Department  at [facility]  C-SSRS RISK CATEGORY No Risk No Risk No Risk        Prior Inpatient Therapy: No. If yes, describe  Prior Outpatient Therapy: No. If yes, describe   Alcohol Screening: 1. How often do you have a drink containing alcohol?: 4 or more times a week 2. How many drinks containing alcohol do you have on a typical day when you are drinking?: 5 or 6 3. How often do you have six or more drinks on one occasion?: Daily or almost daily AUDIT-C Score: 10 4. How often during the last year have you found that you were not able to stop drinking once you had started?: Daily or almost daily 5. How often during the last year have you failed to do what was normally expected from you because of drinking?: Never 6. How often during the last year have you needed a first drink in the morning to get yourself going after a heavy drinking session?: Never 7. How often during the last year have you had a feeling of guilt of remorse after drinking?: Never 8. How often during the last year have you been unable to remember what happened the night before because you had been drinking?: Less than monthly 9. Have you or someone else been injured as a result of your drinking?: No 10. Has a relative or friend or a doctor or another health worker been concerned about your drinking or suggested you cut down?: No Alcohol Use Disorder Identification Test Final Score (AUDIT): 15  Alcohol Brief Interventions/Follow-up: Alcohol education/Brief advice Substance Abuse History in the last 12 months:  No. Consequences of Substance Abuse: NA Previous Psychotropic Medications: No  Psychological Evaluations: No  Past Medical History:  Past Medical History:  Diagnosis Date   Back pain    BPH (benign prostatic hyperplasia)    Gout    Hyperlipidemia    Hypertension    Obesity    Sleep apnea    Vertigo 03/28/2018   with vommiting    Past Surgical History:  Procedure Laterality Date   BACK SURGERY      Family History:  Family History  Problem Relation Age of Onset   Heart disease Mother    Lung cancer Father    Colon polyps Neg Hx    Colon cancer Neg Hx    Esophageal cancer Neg Hx    Rectal cancer Neg Hx    Stomach cancer Neg Hx    Family Psychiatric  History: Unremarkable Tobacco Screening:  Social History   Tobacco Use  Smoking Status Former   Current packs/day: 0.00   Types: Cigarettes   Quit date: 07/21/1977   Years since quitting: 45.5  Smokeless Tobacco Never    BH Tobacco Counseling     Are you interested in Tobacco Cessation Medications?  No value filed. Counseled patient on smoking cessation:  No value filed. Reason Tobacco Screening Not Completed: No value filed.       Social History:  Social History   Substance and Sexual Activity  Alcohol Use Yes   Alcohol/week: 6.0 standard drinks of alcohol   Types: 6 Cans of beer per week     Social History   Substance and Sexual Activity  Drug Use No    Additional Social History:                           Allergies:  No Known Allergies Lab Results:  Results for orders placed or performed during the hospital encounter of 02/08/23 (from the past 48 hour(s))  Comprehensive metabolic panel     Status: Abnormal   Collection Time: 02/08/23  3:42 PM  Result Value Ref Range   Sodium 138 135 - 145 mmol/L   Potassium 3.6 3.5 - 5.1 mmol/L   Chloride 107 98 - 111 mmol/L   CO2 21 (L) 22 - 32 mmol/L   Glucose, Bld 94 70 - 99 mg/dL    Comment: Glucose reference range applies only to samples taken after fasting for at least 8 hours.   BUN 10 8 - 23 mg/dL   Creatinine, Ser 8.41 0.61 - 1.24 mg/dL   Calcium 9.4 8.9 - 32.4 mg/dL   Total Protein 7.5 6.5 - 8.1 g/dL   Albumin 4.3 3.5 - 5.0 g/dL   AST 27 15 - 41 U/L   ALT 40 0 - 44 U/L   Alkaline Phosphatase 46 38 - 126 U/L   Total Bilirubin 1.3 (H) 0.3 - 1.2 mg/dL   GFR, Estimated >40 >10 mL/min    Comment: (NOTE) Calculated using the CKD-EPI Creatinine  Equation (2021)    Anion gap 10 5 - 15    Comment: Performed at [facility], [address] [place], [place] [address]  Ethanol     Status: None   Collection Time: 02/08/23  3:42 PM  Result Value Ref Range   Alcohol, Ethyl (B) <10 <10 mg/dL    Comment: (NOTE) Lowest detectable limit for serum alcohol is 10  mg/dL.  For medical purposes only. Performed at [facility], [address], [place], [place] [address]   Salicylate level     Status: Abnormal   Collection Time: 02/08/23  3:42 PM  Result Value Ref Range   Salicylate Lvl <7.0 (L) 7.0 - 30.0 mg/dL    Comment: Performed at [facility], [address] [place], [place] [address]  Acetaminophen level     Status: Abnormal   Collection Time: 02/08/23  3:42 PM  Result Value Ref Range   Acetaminophen (Tylenol), Serum <10 (L) 10 - 30 ug/mL    Comment: (NOTE) Therapeutic concentrations vary significantly. A range of 10-30 ug/mL  may be an effective concentration for many patients. However, some  are best treated at concentrations outside of this range. Acetaminophen concentrations >150 ug/mL at 4 hours after ingestion  and >50 ug/mL at 12 hours after ingestion are often associated with  toxic reactions.  Performed at [facility], [address] [place], [place] [address]   cbc     Status: None   Collection Time: 02/08/23  3:42 PM  Result Value Ref Range   WBC 5.9 4.0 - 10.5 K/uL   RBC 5.03 4.22 - 5.81 MIL/uL   Hemoglobin 14.8 13.0 - 17.0 g/dL   HCT 72.5 36.6 - 44.0 %   MCV 90.3 80.0 - 100.0 fL   MCH 29.4 26.0 - 34.0 pg   MCHC 32.6 30.0 - 36.0 g/dL   RDW 34.7 42.5 - 95.6 %   Platelets 216 150 - 400 K/uL   nRBC 0.0 0.0 - 0.2 %    Comment: Performed at [facility], [address], [place], [place] [address]  Rapid urine drug screen (hospital performed)     Status: None   Collection Time: 02/08/23  4:22 PM  Result Value Ref Range   Opiates NONE DETECTED NONE DETECTED   Cocaine NONE  DETECTED NONE DETECTED   Benzodiazepines NONE DETECTED NONE DETECTED   Amphetamines NONE DETECTED NONE DETECTED   Tetrahydrocannabinol NONE DETECTED NONE DETECTED   Barbiturates NONE DETECTED NONE DETECTED    Comment: (NOTE) DRUG SCREEN FOR MEDICAL PURPOSES ONLY.  IF CONFIRMATION IS NEEDED FOR ANY PURPOSE, NOTIFY LAB WITHIN 5 DAYS.  LOWEST DETECTABLE LIMITS FOR URINE DRUG SCREEN Drug Class                     Cutoff (ng/mL) Amphetamine and metabolites    1000 Barbiturate and metabolites    200 Benzodiazepine                 200 Opiates and metabolites        300 Cocaine and metabolites        300 THC                            50 Performed at [facility], [address] [place], [place] [address]     Blood Alcohol level:  Lab Results  Component Value Date   [facility] <10 02/08/2023   ETH <10 03/28/2018    Metabolic Disorder Labs:  Lab Results  Component Value Date   HGBA1C  06/25/2008    6.0 (NOTE)   The ADA recommends the following therapeutic goal for glycemic   control related to Hgb A1C measurement:   Goal of Therapy:   < 7.0% Hgb A1C   Reference: American Diabetes Association: Clinical  Practice   Recommendations 2008, Diabetes Care,  2008, 31:(Suppl 1).   MPG 126 06/25/2008   No results found for: "PROLACTIN" Lab Results  Component Value Date   CHOL  06/26/2008    142        ATP III CLASSIFICATION:  <200     mg/dL   Desirable  161-096  mg/dL   Borderline High  >=045    mg/dL   High   TRIG 55 40/98/1191   HDL 35 (L) 06/26/2008   CHOLHDL 4.1 06/26/2008   VLDL 24 06/26/2008   LDLCALC  06/26/2008    83        Total Cholesterol/HDL:CHD Risk Coronary Heart Disease Risk Table                     Men   Women  1/2 Average Risk   3.4   3.3    Current Medications: Current Facility-Administered Medications  Medication Dose Route Frequency Provider Last Rate Last Admin   acetaminophen (TYLENOL) tablet 650 mg  650 mg Oral Q6H PRN Onuoha, Chinwendu V, NP        allopurinol (ZYLOPRIM) tablet 150 mg  150 mg Oral Daily Onuoha, Chinwendu V, NP   150 mg at 02/09/23 1003   alum & mag hydroxide-simeth (MAALOX/MYLANTA) 200-200-20 MG/5ML suspension 30 mL  30 mL Oral Q4H PRN Onuoha, Chinwendu V, NP       finasteride (PROSCAR) tablet 5 mg  5 mg Oral Daily Onuoha, Chinwendu V, NP   5 mg at 02/09/23 1003   haloperidol (HALDOL) tablet 5 mg  5 mg Oral TID PRN Onuoha, Chinwendu V, NP       Or   haloperidol lactate (HALDOL) injection 5 mg  5 mg Intramuscular TID PRN Onuoha, Chinwendu V, NP       hydrOXYzine (ATARAX) tablet 25 mg  25 mg Oral Q6H PRN Onuoha, Chinwendu V, NP       loperamide (IMODIUM) capsule 2-4 mg  2-4 mg Oral PRN Onuoha, Chinwendu V, NP       LORazepam (ATIVAN) tablet 1 mg  1 mg Oral Q6H PRN Onuoha, Chinwendu V, NP       magnesium hydroxide (MILK OF MAGNESIA) suspension 30 mL  30 mL Oral Daily PRN Onuoha, Chinwendu V, NP       multivitamin with minerals tablet 1 tablet  1 tablet Oral Daily Onuoha, Chinwendu V, NP   1 tablet at 02/09/23 1003   ondansetron (ZOFRAN-ODT) disintegrating tablet 4 mg  4 mg Oral Q6H PRN Onuoha, Chinwendu V, NP       rosuvastatin (CRESTOR) tablet 5 mg  5 mg Oral Daily Onuoha, Chinwendu V, NP   5 mg at 02/09/23 1003   sertraline (ZOLOFT) tablet 25 mg  25 mg Oral Daily Onuoha, Chinwendu V, NP   25 mg at 02/09/23 1003   traZODone (DESYREL) tablet 50 mg  50 mg Oral QHS PRN Onuoha, Chinwendu V, NP   50 mg at 02/09/23 0459   PTA Medications: Medications Prior to Admission  Medication Sig Dispense Refill Last Dose   allopurinol (ZYLOPRIM) 300 MG tablet Take 300 mg by mouth daily.  0    amLODipine (NORVASC) 10 MG tablet Take 1 tablet (10 mg total) by mouth daily. 30 tablet 0    finasteride (PROSCAR) 5 MG tablet Take 1 tablet (5 mg total) by mouth daily. 30 tablet 1    lisinopril (ZESTRIL) 20 MG tablet Take 20 mg by mouth daily.  rosuvastatin (CRESTOR) 20 MG tablet Take 20 mg by mouth daily.      terazosin (HYTRIN) 1 MG  capsule Take 1 mg by mouth at bedtime.       Musculoskeletal: Strength & Muscle Tone: within normal limits Gait & Station: normal Patient leans: N/A            Psychiatric Specialty Exam:  Presentation  General Appearance:  Appropriate for Environment; Fairly Groomed  Eye Contact: Poor  Speech: Clear and Coherent; Normal Rate  Speech Volume: Normal  Handedness: Right   Mood and Affect  Mood: Dysphoric; Depressed; Angry  Affect: Other (comment) (Neutral if mildly constricted)   Thought Process  Thought Processes: Linear; Goal Directed; Coherent  Duration of Psychotic Symptoms:N/A Past Diagnosis of Schizophrenia or Psychoactive disorder: No data recorded Descriptions of Associations:Intact  Orientation:Full (Time, Place and Person)  Thought Content:Logical  Hallucinations:Hallucinations: None  Ideas of Reference:None  Suicidal Thoughts:Suicidal Thoughts: No  Homicidal Thoughts:Homicidal Thoughts: Yes, Active HI Active Intent and/or Plan: With Intent; With Plan; With Means to Carry Out; With Access to Means   Sensorium  Memory: Immediate Good; Recent Good; Remote Good  Judgment: Intact  Insight: Fair   Art therapist  Concentration: Fair  Attention Span: Fair  Recall: Jennelle Human of Knowledge: Fair  Language: Fair   Psychomotor Activity  Psychomotor Activity: Psychomotor Activity: Normal   Assets  Assets: Housing; Physical Health; Resilience; Leisure Time; Transportation; Vocational/Educational; Health and safety inspector; Desire for Improvement; Talents/Skills; Manufacturing systems engineer; Social Support   Sleep  Sleep: Sleep: Poor    Physical Exam: Physical Exam Vitals and nursing note reviewed.  Constitutional:      Appearance: Normal appearance. He is normal weight.  HENT:     Head: Normocephalic and atraumatic.     Nose: Nose normal.     Mouth/Throat:     Pharynx: Oropharynx is clear.  Eyes:      Extraocular Movements: Extraocular movements intact.     Pupils: Pupils are equal, round, and reactive to light.  Cardiovascular:     Rate and Rhythm: Normal rate and regular rhythm.     Pulses: Normal pulses.     Heart sounds: Normal heart sounds.  Pulmonary:     Effort: Pulmonary effort is normal.     Breath sounds: Normal breath sounds.  Abdominal:     General: Abdomen is flat. Bowel sounds are normal.     Palpations: Abdomen is soft.  Musculoskeletal:        General: Normal range of motion.     Cervical back: Normal range of motion and neck supple.  Skin:    General: Skin is warm and dry.  Neurological:     General: No focal deficit present.     Mental Status: He is alert and oriented to person, place, and time.  Psychiatric:        Attention and Perception: Attention and perception normal.        Mood and Affect: Mood normal. Affect is angry.        Speech: Speech normal.        Behavior: Behavior normal. Behavior is cooperative.        Thought Content: Thought content normal.        Cognition and Memory: Cognition and memory normal.        Judgment: Judgment normal.    Review of Systems  Constitutional: Negative.   HENT: Negative.    Eyes: Negative.   Respiratory: Negative.    Cardiovascular: Negative.  Gastrointestinal: Negative.   Genitourinary: Negative.   Musculoskeletal: Negative.   Skin: Negative.   Neurological: Negative.   Endo/Heme/Allergies: Negative.   Psychiatric/Behavioral: Negative.     Blood pressure (!) 137/90, pulse 71, temperature 97.9 F (36.6 C), temperature source Tympanic, resp. rate 17, height 5\' 11"  (1.803 m), weight 119.7 kg. Body mass index is 36.82 kg/m.  Treatment Plan Summary: Daily contact with patient to assess and evaluate symptoms and progress in treatment, Medication management, and Plan see orders  Observation Level/Precautions:  15 minute checks  Laboratory:  CBC Chemistry Profile  Psychotherapy:    Medications:     Consultations:    Discharge Concerns:    Estimated LOS:  Other:     Physician Treatment Plan for Primary Diagnosis: MDD (major depressive disorder), single episode, severe , no psychosis (HCC) Long Term Goal(s): Improvement in symptoms so as ready for discharge  Short Term Goals: Ability to identify changes in lifestyle to reduce recurrence of condition will improve  Physician Treatment Plan for Secondary Diagnosis: Principal Problem:   MDD (major depressive disorder), single episode, severe , no psychosis (HCC)     I certify that inpatient services furnished can reasonably be expected to improve the patient's condition.    Sarina Ill, DO 8/2/20241:37 PM

## 2023-02-09 NOTE — Group Note (Signed)
Date:  02/09/2023 Time:  3:50 PM  Group Topic/Focus:  Goals Group:   The focus of this group is to help patients establish daily goals to achieve during treatment and discuss how the patient can incorporate goal setting into their daily lives to aide in recovery.    Participation Level:  Did Not Attend    Ricky Bailey 02/09/2023, 3:50 PM

## 2023-02-09 NOTE — Plan of Care (Signed)
Patient new to the unit haven't time to progress.   Problem: Education: Goal: Knowledge of General Education information will improve Description: Including pain rating scale, medication(s)/side effects and non-pharmacologic comfort measures Outcome: Not Progressing   Problem: Nutrition: Goal: Adequate nutrition will be maintained Outcome: Not Progressing   Problem: Coping: Goal: Level of anxiety will decrease Outcome: Not Progressing   Problem: Safety: Goal: Ability to remain free from injury will improve Outcome: Not Progressing

## 2023-02-09 NOTE — BH IP Treatment Plan (Signed)
Interdisciplinary Treatment and Diagnostic Plan Update  02/09/2023 Time of Session: 10:21 AM  Ricky Bailey MRN: [id number]  Principal Diagnosis: MDD (major depressive disorder), single episode, severe , no psychosis (HCC)  Secondary Diagnoses: Principal Problem:   MDD (major depressive disorder), single episode, severe , no psychosis (HCC)   Current Medications:  Current Facility-Administered Medications  Medication Dose Route Frequency Provider Last Rate Last Admin   acetaminophen (TYLENOL) tablet 650 mg  650 mg Oral Q6H PRN Onuoha, Chinwendu V, NP       allopurinol (ZYLOPRIM) tablet 150 mg  150 mg Oral Daily Onuoha, Chinwendu V, NP   150 mg at 02/09/23 1003   alum & mag hydroxide-simeth (MAALOX/MYLANTA) 200-200-20 MG/5ML suspension 30 mL  30 mL Oral Q4H PRN Onuoha, Chinwendu V, NP       finasteride (PROSCAR) tablet 5 mg  5 mg Oral Daily Onuoha, Chinwendu V, NP   5 mg at 02/09/23 1003   haloperidol (HALDOL) tablet 5 mg  5 mg Oral TID PRN Onuoha, Chinwendu V, NP       Or   haloperidol lactate (HALDOL) injection 5 mg  5 mg Intramuscular TID PRN Onuoha, Chinwendu V, NP       hydrOXYzine (ATARAX) tablet 25 mg  25 mg Oral Q6H PRN Onuoha, Chinwendu V, NP       loperamide (IMODIUM) capsule 2-4 mg  2-4 mg Oral PRN Onuoha, Chinwendu V, NP       LORazepam (ATIVAN) tablet 1 mg  1 mg Oral Q6H PRN Onuoha, Chinwendu V, NP       magnesium hydroxide (MILK OF MAGNESIA) suspension 30 mL  30 mL Oral Daily PRN Onuoha, Chinwendu V, NP       multivitamin with minerals tablet 1 tablet  1 tablet Oral Daily Onuoha, Chinwendu V, NP   1 tablet at 02/09/23 1003   ondansetron (ZOFRAN-ODT) disintegrating tablet 4 mg  4 mg Oral Q6H PRN Onuoha, Chinwendu V, NP       rosuvastatin (CRESTOR) tablet 5 mg  5 mg Oral Daily Onuoha, Chinwendu V, NP   5 mg at 02/09/23 1003   sertraline (ZOLOFT) tablet 25 mg  25 mg Oral Daily Onuoha, Chinwendu V, NP   25 mg at 02/09/23 1003   traZODone (DESYREL) tablet 50 mg  50 mg Oral QHS  PRN Onuoha, Chinwendu V, NP   50 mg at 02/09/23 0459   PTA Medications: Medications Prior to Admission  Medication Sig Dispense Refill Last Dose   allopurinol (ZYLOPRIM) 300 MG tablet Take 300 mg by mouth daily.  0    amLODipine (NORVASC) 10 MG tablet Take 1 tablet (10 mg total) by mouth daily. 30 tablet 0    finasteride (PROSCAR) 5 MG tablet Take 1 tablet (5 mg total) by mouth daily. 30 tablet 1    lisinopril (ZESTRIL) 20 MG tablet Take 20 mg by mouth daily.      rosuvastatin (CRESTOR) 20 MG tablet Take 20 mg by mouth daily.      terazosin (HYTRIN) 1 MG capsule Take 1 mg by mouth at bedtime.       Patient Stressors: Financial difficulties   Loss of work    Patient Strengths: Ability for insight  Capable of independent living  Printmaker for treatment/growth  Supportive family/friends   Treatment Modalities: Medication Management, Group therapy, Case management,  1 to 1 session with clinician, Psychoeducation, Recreational therapy.   Physician Treatment Plan for Primary Diagnosis: MDD (major depressive disorder), single episode, severe ,  no psychosis (HCC) Long Term Goal(s):     Short Term Goals:    Medication Management: Evaluate patient's response, side effects, and tolerance of medication regimen.  Therapeutic Interventions: 1 to 1 sessions, Unit Group sessions and Medication administration.  Evaluation of Outcomes: Progressing  Physician Treatment Plan for Secondary Diagnosis: Principal Problem:   MDD (major depressive disorder), single episode, severe , no psychosis (HCC)  Long Term Goal(s):     Short Term Goals:       Medication Management: Evaluate patient's response, side effects, and tolerance of medication regimen.  Therapeutic Interventions: 1 to 1 sessions, Unit Group sessions and Medication administration.  Evaluation of Outcomes: Progressing   RN Treatment Plan for Primary Diagnosis: MDD (major depressive disorder), single  episode, severe , no psychosis (HCC) Long Term Goal(s): Knowledge of disease and therapeutic regimen to maintain health will improve  Short Term Goals: Ability to remain free from injury will improve, Ability to verbalize frustration and anger appropriately will improve, Ability to demonstrate self-control, Ability to participate in decision making will improve, Ability to verbalize feelings will improve, Ability to disclose and discuss suicidal ideas, Ability to identify and develop effective coping behaviors will improve, and Compliance with prescribed medications will improve  Medication Management: RN will administer medications as ordered by provider, will assess and evaluate patient's response and provide education to patient for prescribed medication. RN will report any adverse and/or side effects to prescribing provider.  Therapeutic Interventions: 1 on 1 counseling sessions, Psychoeducation, Medication administration, Evaluate responses to treatment, Monitor vital signs and CBGs as ordered, Perform/monitor CIWA, COWS, AIMS and Fall Risk screenings as ordered, Perform wound care treatments as ordered.  Evaluation of Outcomes: Progressing   LCSW Treatment Plan for Primary Diagnosis: MDD (major depressive disorder), single episode, severe , no psychosis (HCC) Long Term Goal(s): Safe transition to appropriate next level of care at discharge, Engage patient in therapeutic group addressing interpersonal concerns.  Short Term Goals: Engage patient in aftercare planning with referrals and resources, Increase social support, Increase ability to appropriately verbalize feelings, Increase emotional regulation, Facilitate acceptance of mental health diagnosis and concerns, and Increase skills for wellness and recovery  Therapeutic Interventions: Assess for all discharge needs, 1 to 1 time with Social worker, Explore available resources and support systems, Assess for adequacy in community support  network, Educate family and significant other(s) on suicide prevention, Complete Psychosocial Assessment, Interpersonal group therapy.  Evaluation of Outcomes: Progressing   Progress in Treatment: Attending groups: Yes. Participating in groups: Yes. Taking medication as prescribed: Yes. Toleration medication: Yes. Family/Significant other contact made: No, will contact:  CSW will contact if given permission  Patient understands diagnosis: Yes. Discussing patient identified problems/goals with staff: Yes. Medical problems stabilized or resolved: Yes. Denies suicidal/homicidal ideation: Yes. Issues/concerns per patient self-inventory: No. Other: None   New problem(s) identified: No, Describe:  None identified   New Short Term/Long Term Goal(s): elimination of symptoms of psychosis, medication management for mood stabilization; elimination of SI thoughts; development of comprehensive mental wellness plan.   Patient Goals:  "I shouldn't even be here, if I stay the hell away from people I'll be alright"  Discharge Plan or Barriers: CSW will assist with appropriate discharge planning   Reason for Continuation of Hospitalization: Depression Medication stabilization  Estimated Length of Stay: 1 to 7 days   Last 3 [place] Suicide Severity Risk Score: Flowsheet Row Admission (Current) from 02/09/2023 in [facility] ED from 02/08/2023 in [facility] Emergency Department at  [facility] ED from 05/28/2022 in [facility] Department at [facility]  C-SSRS RISK CATEGORY No Risk No Risk No Risk       Last [facility] 2/9 Scores:     No data to display          Scribe for Treatment Team: Elza Rafter, Theresia Majors 02/09/2023 12:14 PM

## 2023-02-09 NOTE — Plan of Care (Signed)
  Problem: Activity: Goal: Risk for activity intolerance will decrease Outcome: Progressing   Problem: Nutrition: Goal: Adequate nutrition will be maintained Outcome: Progressing   Problem: Coping: Goal: Level of anxiety will decrease Outcome: Progressing  Patient endorsing anxiety this Shift isolative to his room most to the shift c/o sleep disturbance PRN Vistaril for anxiety given and Trazodone for sleep. Q 15 minutes safety checks ongoing Patient remains safe. Denies SI/HI/A/VH and verbally contracts for safety.

## 2023-02-09 NOTE — BHH Counselor (Signed)
Adult Comprehensive Assessment  Patient ID: Ricky Bailey, male   DOB: 05-Jul-1956, 67 y.o.   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 Family Relationships: None reported Surveyor, quantity / Lack of resources (include bankruptcy): Pt reports he may have some issues in September, but for right now his bills are caught up Housing / Lack of housing: None reported Physical health (include injuries & life threatening diseases): Pt reports blood pressure issues since this situation happened Social relationships: "I ain't got that many friends, but the ones I do have we get along just fine" Substance abuse: " I drink more than I ever did, than I have in the last year and half" Bereavement / Loss: Pt reports he lost a brother in November 2023  Living/Environment/Situation:  Living Arrangements: Alone Living conditions (as described by patient or guardian): Pt reports it's comfortable Who else lives in the home?: No one How long has patient lived in current situation?: 4 months What is atmosphere in current home: Comfortable  Family History:  Marital status: Divorced Divorced, when?: Pt does not report What types of issues is patient dealing with in the relationship?: "kids" Additional relationship information: None reported Are you sexually active?: No What is your sexual orientation?: "Straight" Has your sexual activity been affected by drugs, alcohol, medication, or emotional stress?: No Does patient have children?: Yes How many children?: 7 How is  patient's relationship with their children?: "good"  Childhood History:  By whom was/is the patient raised?: Both parents Additional childhood history information: Pt reports his dad was in the [employer] and his household was very strict Description of patient's relationship with caregiver when they were a child: Pt reports that it was good, but strict Patient's description of current relationship with people who raised him/her: Pt reports his parents have passed How were you disciplined when you got in trouble as a child/adolescent?: "I got my ass whooped" Does patient have siblings?: Yes Number of Siblings: 9 Description of patient's current relationship with siblings: "We have our issue, but good" Did patient suffer any verbal/emotional/physical/sexual abuse as a child?: No Did patient suffer from severe childhood neglect?: No Has patient ever been sexually abused/assaulted/raped as an adolescent or adult?: No Was the patient ever a victim of a crime or a disaster?: No Witnessed domestic violence?: No Has patient been affected by domestic violence as an adult?: No  Education:  Highest grade of school patient has completed: GED Currently a Consulting civil engineer?: No Learning disability?: No  Employment/Work Situation:   Employment Situation: Unemployed Patient's Job has Been Impacted by Current Illness: No Has Patient ever Been in the [employer]?: No  Financial Resources:   Surveyor, quantity resources: No income Does patient have a Lawyer or guardian?: No  Alcohol/Substance Abuse:   What has been your use of drugs/alcohol within the last 12 months?: Pt reports he drinks 5-6 beers today, and some 3 or 4 "drinks of liquor" If attempted suicide, did drugs/alcohol play a role in this?: No Alcohol/Substance Abuse Treatment Hx: Denies past history Has alcohol/substance abuse ever caused legal problems?: Yes (Pt  reports a DUI from 42 years ago)  Social Support System:   Patient's Community  Support System: Fair Museum/gallery exhibitions officer System: Pt reports his children, his grandkids, his family, his friends Type of faith/religion: "No" How does patient's faith help to cope with current illness?: Pt reports he does read his bible "not as often as I should"  Leisure/Recreation:   Do You Have Hobbies?: Yes Leisure and Hobbies: Pt reports he enjoys pool, fishing, and hanging out with his granddaughters  Strengths/Needs:   What is the patient's perception of their strengths?: Pt reports he's "mechanically inclined" and he fixes things Patient states they can use these personal strengths during their treatment to contribute to their recovery: Pt does not report Patient states these barriers may affect/interfere with their treatment: None reported Patient states these barriers may affect their return to the community: None reported Other important information patient would like considered in planning for their treatment: "Naturally I'm a quiet person, not trying to be mean to the staff, but that's just how I am, I stay to myself"  Discharge Plan:   Currently receiving community mental health services: No Patient states concerns and preferences for aftercare planning are: Pt doesn't know if they can afford services, so they don't want anything scheduled, but wants information to look at Patient states they will know when they are safe and ready for discharge when: " I would say in a few days, because if I was to go back home I'd probably stay away from people as far as me not wanting to be bothered" Does patient have access to transportation?: Yes Does patient have financial barriers related to discharge medications?: Yes (Pt reports they lost  their job) Patient description of barriers related to discharge medications: Affordability Will patient be returning to same living situation after discharge?: Yes  Summary/Recommendations:   Summary and Recommendations (to be completed by the  evaluator): Patient is a 67 year old Black male from [place], [place] [facility]) who was voluntarily admitted to inpatient psychiatry.  Ricky Bailey lost his job recently after testing positive for opioids.  He says that it was an inaccurate test and he had 4 subsequent tests that were negative.  He has been a Naval architect for 20 years and this has really made him angry. His diagnosis is Major Depressive Disorder.  Patient reports he has been drinking more than normal, but doesn't feel that it is problematic. Patient reports increasing feelings of depression since April after losing his job and wants his independence back. Recommendations include: crisis stabilization, therapeutic milieu, encourage group attendance and participation, medication management for detox/mood stabilization and development of comprehensive mental wellness/sobriety plan.  Elza Rafter. 02/09/2023

## 2023-02-09 NOTE — Group Note (Signed)
LCSW Group Therapy Note   Group Date: 02/09/2023 Start Time: 1315 End Time: 1345   Type of Therapy and Topic:  Group Therapy: Boundaries  Participation Level:  Active  Description of Group: This group will address the use of boundaries in their personal lives. Patients will explore why boundaries are important, the difference between healthy and unhealthy boundaries, and negative and postive outcomes of different boundaries and will look at how boundaries can be crossed.  Patients will be encouraged to identify current boundaries in their own lives and identify what kind of boundary is being set. Facilitators will guide patients in utilizing problem-solving interventions to address and correct types boundaries being used and to address when no boundary is being used. Understanding and applying boundaries will be explored and addressed for obtaining and maintaining a balanced life. Patients will be encouraged to explore ways to assertively make their boundaries and needs known to significant others in their lives, using other group members and facilitator for role play, support, and feedback.  Therapeutic Goals:  1.  Patient will identify areas in their life where setting clear boundaries could be  used to improve their life.  2.  Patient will identify signs/triggers that a boundary is not being respected. 3.  Patient will identify two ways to set boundaries in order to achieve balance in  their lives: 4.  Patient will demonstrate ability to communicate their needs and set boundaries  through discussion and/or role plays  Summary of Patient Progress:  Jayln was both present/active throughout the session and proved open to feedback from CSW and peers. Patient demonstrated good insight into the subject matter, was respectful of peers, and was present throughout the entire session.  Therapeutic Modalities:   Cognitive Behavioral Therapy Solution-Focused Therapy  Elza Rafter,  [place] 02/09/2023  1:59 PM

## 2023-02-09 NOTE — Plan of Care (Signed)

## 2023-02-10 MED ORDER — SERTRALINE HCL 50 MG PO TABS
50.0000 mg | ORAL_TABLET | Freq: Every day | ORAL | Status: DC
  Administered 2023-02-11 – 2023-02-15 (×5): 50 mg via ORAL
  Filled 2023-02-10 (×5): qty 1

## 2023-02-10 NOTE — Group Note (Signed)
Date:  02/10/2023 Time:  10:50 PM  Group Topic/Focus:  Conflict Resolution:   The focus of this group is to discuss the conflict resolution process and how it may be used upon discharge. Crisis Planning:   The purpose of this group is to help patients create a crisis plan for use upon discharge or in the future, as needed. Dimensions of Wellness:   The focus of this group is to introduce the topic of wellness and discuss the role each dimension of wellness plays in total health.    Participation Level:  Minimal  Participation Quality:  Appropriate  Affect:  Appropriate  Cognitive:  Appropriate  Engagement in Group:  Limited  Modes of Intervention:  Discussion  Additional Comments:    Osker Mason 02/10/2023, 10:50 PM

## 2023-02-10 NOTE — Progress Notes (Signed)
[facility] MD Progress Note  02/10/2023 12:46 PM Ricky Bailey  MRN:  [id number] Subjective: Ricky Bailey is seen on rounds.  Has been compliant with medication.  He still a little angry but has been pleasant and cooperative on the unit Principal Problem: MDD (major depressive disorder), single episode, severe , no psychosis (HCC) Diagnosis: Principal Problem:   MDD (major depressive disorder), single episode, severe , no psychosis (HCC)  Total Time spent with patient: 15 minutes  Past Psychiatric History: None  Past Medical History:  Past Medical History:  Diagnosis Date   Back pain    BPH (benign prostatic hyperplasia)    Gout    Hyperlipidemia    Hypertension    Obesity    Sleep apnea    Vertigo 03/28/2018   with vommiting    Past Surgical History:  Procedure Laterality Date   BACK SURGERY     Family History:  Family History  Problem Relation Age of Onset   Heart disease Mother    Lung cancer Father    Colon polyps Neg Hx    Colon cancer Neg Hx    Esophageal cancer Neg Hx    Rectal cancer Neg Hx    Stomach cancer Neg Hx    Family Psychiatric  History: None Social History:  Social History   Substance and Sexual Activity  Alcohol Use Yes   Alcohol/week: 6.0 standard drinks of alcohol   Types: 6 Cans of beer per week     Social History   Substance and Sexual Activity  Drug Use No    Social History   Socioeconomic History   Marital status: Married    Spouse name: Not on file   Number of children: Not on file   Years of education: Not on file   Highest education level: Not on file  Occupational History   Not on file  Tobacco Use   Smoking status: Former    Current packs/day: 0.00    Types: Cigarettes    Quit date: 07/21/1977    Years since quitting: 45.5   Smokeless tobacco: Never  Vaping Use   Vaping status: Never Used  Substance and Sexual Activity   Alcohol use: Yes    Alcohol/week: 6.0 standard drinks of alcohol    Types: 6 Cans of beer per week   Drug  use: No   Sexual activity: Not on file  Other Topics Concern   Not on file  Social History Narrative   Not on file   Social Determinants of Health   Financial Resource Strain: Not on file  Food Insecurity: No Food Insecurity (02/09/2023)   Hunger Vital Sign    Worried About Running Out of Food in the Last Year: Never true    Ran Out of Food in the Last Year: Never true  Transportation Needs: No Transportation Needs (02/09/2023)   PRAPARE - Administrator, Civil Service (Medical): No    Lack of Transportation (Non-Medical): No  Physical Activity: Not on file  Stress: Not on file  Social Connections: Unknown (11/20/2021)   Received from [facility]   Social Network    Social Network: Not on file   Additional Social History:                         Sleep: Good  Appetite: Good  Current Medications: Current Facility-Administered Medications  Medication Dose Route Frequency Provider Last Rate Last Admin   acetaminophen (TYLENOL) tablet 650  mg  650 mg Oral Q6H PRN Onuoha, Chinwendu V, NP       allopurinol (ZYLOPRIM) tablet 150 mg  150 mg Oral Daily Onuoha, Chinwendu V, NP   150 mg at 02/10/23 1027   alum & mag hydroxide-simeth (MAALOX/MYLANTA) 200-200-20 MG/5ML suspension 30 mL  30 mL Oral Q4H PRN Onuoha, Chinwendu V, NP       finasteride (PROSCAR) tablet 5 mg  5 mg Oral Daily Onuoha, Chinwendu V, NP   5 mg at 02/10/23 1027   haloperidol (HALDOL) tablet 5 mg  5 mg Oral TID PRN Onuoha, Chinwendu V, NP       Or   haloperidol lactate (HALDOL) injection 5 mg  5 mg Intramuscular TID PRN Onuoha, Chinwendu V, NP       hydrOXYzine (ATARAX) tablet 25 mg  25 mg Oral Q6H PRN Onuoha, Chinwendu V, NP   25 mg at 02/09/23 2135   loperamide (IMODIUM) capsule 2-4 mg  2-4 mg Oral PRN Onuoha, Chinwendu V, NP       LORazepam (ATIVAN) tablet 1 mg  1 mg Oral Q6H PRN Onuoha, Chinwendu V, NP       magnesium hydroxide (MILK OF MAGNESIA) suspension 30 mL  30 mL Oral Daily  PRN Onuoha, Chinwendu V, NP       multivitamin with minerals tablet 1 tablet  1 tablet Oral Daily Onuoha, Chinwendu V, NP   1 tablet at 02/10/23 1027   ondansetron (ZOFRAN-ODT) disintegrating tablet 4 mg  4 mg Oral Q6H PRN Onuoha, Chinwendu V, NP       rosuvastatin (CRESTOR) tablet 5 mg  5 mg Oral Daily Onuoha, Chinwendu V, NP   5 mg at 02/10/23 1027   sertraline (ZOLOFT) tablet 25 mg  25 mg Oral Daily Onuoha, Chinwendu V, NP   25 mg at 02/10/23 1027   traZODone (DESYREL) tablet 50 mg  50 mg Oral QHS PRN Onuoha, Chinwendu V, NP   50 mg at 02/09/23 2134    Lab Results:  Results for orders placed or performed during the hospital encounter of 02/08/23 (from the past 48 hour(s))  Comprehensive metabolic panel     Status: Abnormal   Collection Time: 02/08/23  3:42 PM  Result Value Ref Range   Sodium 138 135 - 145 mmol/L   Potassium 3.6 3.5 - 5.1 mmol/L   Chloride 107 98 - 111 mmol/L   CO2 21 (L) 22 - 32 mmol/L   Glucose, Bld 94 70 - 99 mg/dL    Comment: Glucose reference range applies only to samples taken after fasting for at least 8 hours.   BUN 10 8 - 23 mg/dL   Creatinine, Ser 0.98 0.61 - 1.24 mg/dL   Calcium 9.4 8.9 - 11.9 mg/dL   Total Protein 7.5 6.5 - 8.1 g/dL   Albumin 4.3 3.5 - 5.0 g/dL   AST 27 15 - 41 U/L   ALT 40 0 - 44 U/L   Alkaline Phosphatase 46 38 - 126 U/L   Total Bilirubin 1.3 (H) 0.3 - 1.2 mg/dL   GFR, Estimated >14 >78 mL/min    Comment: (NOTE) Calculated using the CKD-EPI Creatinine Equation (2021)    Anion gap 10 5 - 15    Comment: Performed at [facility], [address], [place], [place] [address]  Ethanol     Status: None   Collection Time: 02/08/23  3:42 PM  Result Value Ref Range   Alcohol, Ethyl (B) <10 <10 mg/dL  Comment: (NOTE) Lowest detectable limit for serum alcohol is 10 mg/dL.  For medical purposes only. Performed at [facility], [address] [place], [place] [address]   Salicylate level     Status: Abnormal   Collection  Time: 02/08/23  3:42 PM  Result Value Ref Range   Salicylate Lvl <7.0 (L) 7.0 - 30.0 mg/dL    Comment: Performed at [facility], [address] [place], [place] [address]  Acetaminophen level     Status: Abnormal   Collection Time: 02/08/23  3:42 PM  Result Value Ref Range   Acetaminophen (Tylenol), Serum <10 (L) 10 - 30 ug/mL    Comment: (NOTE) Therapeutic concentrations vary significantly. A range of 10-30 ug/mL  may be an effective concentration for many patients. However, some  are best treated at concentrations outside of this range. Acetaminophen concentrations >150 ug/mL at 4 hours after ingestion  and >50 ug/mL at 12 hours after ingestion are often associated with  toxic reactions.  Performed at [facility], [address], [place], [place] [address]   cbc     Status: None   Collection Time: 02/08/23  3:42 PM  Result Value Ref Range   WBC 5.9 4.0 - 10.5 K/uL   RBC 5.03 4.22 - 5.81 MIL/uL   Hemoglobin 14.8 13.0 - 17.0 g/dL   HCT 46.9 62.9 - 52.8 %   MCV 90.3 80.0 - 100.0 fL   MCH 29.4 26.0 - 34.0 pg   MCHC 32.6 30.0 - 36.0 g/dL   RDW 41.3 24.4 - 01.0 %   Platelets 216 150 - 400 K/uL   nRBC 0.0 0.0 - 0.2 %    Comment: Performed at [facility], [address] [place], [place] [address]  Rapid urine drug screen (hospital performed)     Status: None   Collection Time: 02/08/23  4:22 PM  Result Value Ref Range   Opiates NONE DETECTED NONE DETECTED   Cocaine NONE DETECTED NONE DETECTED   Benzodiazepines NONE DETECTED NONE DETECTED   Amphetamines NONE DETECTED NONE DETECTED   Tetrahydrocannabinol NONE DETECTED NONE DETECTED   Barbiturates NONE DETECTED NONE DETECTED    Comment: (NOTE) DRUG SCREEN FOR MEDICAL PURPOSES ONLY.  IF CONFIRMATION IS NEEDED FOR ANY PURPOSE, NOTIFY LAB WITHIN 5 DAYS.  LOWEST DETECTABLE LIMITS FOR URINE DRUG SCREEN Drug Class                     Cutoff (ng/mL) Amphetamine and metabolites    1000 Barbiturate and  metabolites    200 Benzodiazepine                 200 Opiates and metabolites        300 Cocaine and metabolites        300 THC                            50 Performed at [facility], [address] [place], [place] [address]     Blood Alcohol level:  Lab Results  Component Value Date   [facility] <10 02/08/2023   ETH <10 03/28/2018    Metabolic Disorder Labs: Lab Results  Component Value Date   HGBA1C  06/25/2008    6.0 (NOTE)   The ADA recommends the following therapeutic goal for glycemic   control related to Hgb A1C measurement:   Goal of Therapy:   <  7.0% Hgb A1C   Reference: American Diabetes Association: Clinical Practice   Recommendations 2008, Diabetes Care,  2008, 31:(Suppl 1).   MPG 126 06/25/2008   No results found for: "PROLACTIN" Lab Results  Component Value Date   CHOL  06/26/2008    142        ATP III CLASSIFICATION:  <200     mg/dL   Desirable  119-147  mg/dL   Borderline High  >=829    mg/dL   High   TRIG 55 56/21/3086   HDL 35 (L) 06/26/2008   CHOLHDL 4.1 06/26/2008   VLDL 24 06/26/2008   LDLCALC  06/26/2008    83        Total Cholesterol/HDL:CHD Risk Coronary Heart Disease Risk Table                     Men   Women  1/2 Average Risk   3.4   3.3    Physical Findings: AIMS:  , ,  ,  ,    CIWA:  CIWA-Ar Total: 0 COWS:     Musculoskeletal: Strength & Muscle Tone: Good Gait & Station: Good Patient leans: N/A  Psychiatric Specialty Exam:  Presentation  General Appearance:  Appropriate for Environment; Fairly Groomed  Eye Contact: Poor  Speech: Clear and Coherent; Normal Rate  Speech Volume: Normal  Handedness: Right   Mood and Affect  Mood: Dysphoric; Depressed; Angry  Affect: Other (comment) (Neutral if mildly constricted)   Thought Process  Thought Processes: Linear; Goal Directed; Coherent  Descriptions of Associations:Intact  Orientation:Full (Time, Place and Person)  Thought Content:Logical  History of  Schizophrenia/Schizoaffective disorder:No data recorded Duration of Psychotic Symptoms:No data recorded Hallucinations:No data recorded Ideas of Reference:None  Suicidal Thoughts:No data recorded Homicidal Thoughts:No data recorded  Sensorium  Memory: Immediate Good; Recent Good; Remote Good  Judgment: Intact  Insight: Fair   Art therapist  Concentration: Fair  Attention Span: Fair  Recall: Jennelle Human of Knowledge: Fair  Language: Fair   Psychomotor Activity  Psychomotor Activity:No data recorded  Assets  Assets: Housing; Physical Health; Resilience; Leisure Time; Transportation; Vocational/Educational; Health and safety inspector; Desire for Improvement; Talents/Skills; Manufacturing systems engineer; Social Support   Sleep  Sleep:No data recorded    Blood pressure 132/83, pulse 68, temperature 98.6 F (37 C), temperature source Oral, resp. rate 18, height 5\' 11"  (1.803 m), weight 119.7 kg, SpO2 99%. Body mass index is 36.82 kg/m.   Treatment Plan Summary: Daily contact with patient to assess and evaluate symptoms and progress in treatment, Medication management, and Plan increase Zoloft to 50 mg  Sarina Ill, DO 02/10/2023, 12:46 PM

## 2023-02-10 NOTE — Group Note (Signed)
Date:  02/10/2023 Time:  2:39 PM  Group Topic/Focus:  Listen to music and socializing    Participation Level:  Active  Participation Quality:  Appropriate  Affect:  Appropriate  Cognitive:  Appropriate  Insight: Appropriate  Engagement in Group:  Engaged  Modes of Intervention:  Activity and Socialization  Additional Comments:  None  Rodena Goldmann 02/10/2023, 2:39 PM

## 2023-02-10 NOTE — Group Note (Addendum)
[facility] LCSW Group Therapy Note   Group Date: 02/10/2023 Start Time: 1310 End Time: 1400   Type of Therapy/Topic:  Group Therapy:  Balance in Life  Participation Level: Active  Description of Group:    This group will address the concept of balance and how it feels and looks when one is unbalanced. Patients will be encouraged to process areas in their lives that are out of balance, and identify reasons for remaining unbalanced. Facilitators will guide patients utilizing problem- solving interventions to address and correct the stressor making their life unbalanced. Understanding and applying boundaries will be explored and addressed for obtaining  and maintaining a balanced life. Patients will be encouraged to explore ways to assertively make their unbalanced needs known to significant others in their lives, using other group members and facilitator for support and feedback.  Therapeutic Goals: Patient will identify two or more emotions or situations they have that consume much of in their lives. Patient will identify signs/triggers that life has become out of balance:  Patient will identify two ways to set boundaries in order to achieve balance in their lives:  Patient will demonstrate ability to communicate their needs through discussion and/or role plays  Summary of Patient Progress: Patient did attend group and shared Ricky Bailey was his favorite season during the icebreaker. Patient shared he like to use mental images to practice mindfullness.   Marshell Levan, LCSW

## 2023-02-10 NOTE — Progress Notes (Signed)
Patient admitted on February 09, 2023 for homicidal thoughts towards employer after recent termination. Diagnosis of major depressive disorder. He denies SI/HI/AVH. He denies anxiety and depression. Medications adm whole without difficulty. Pain 0/10.  Ciwa protocol dc'd. Sertraline 50 mg daily added to scheduled meds.  Q15 minute unit checks in place.

## 2023-02-10 NOTE — Plan of Care (Signed)

## 2023-02-11 NOTE — Progress Notes (Signed)
[facility] MD Progress Note  02/11/2023 11:17 AM Ricky Bailey  MRN:  [id number] Subjective: Ricky Bailey was seen on rounds.  He continues to be angry about being fired from his job.  Other than that, he denies any suicidal or homicidal ideation.  Has been compliant with medications.  No problems.  Nurses report no issues. Principal Problem: MDD (major depressive disorder), single episode, severe , no psychosis (HCC) Diagnosis: Principal Problem:   MDD (major depressive disorder), single episode, severe , no psychosis (HCC)  Total Time spent with patient: 15 minutes  Past Psychiatric History: None  Past Medical History:  Past Medical History:  Diagnosis Date   Back pain    BPH (benign prostatic hyperplasia)    Gout    Hyperlipidemia    Hypertension    Obesity    Sleep apnea    Vertigo 03/28/2018   with vommiting    Past Surgical History:  Procedure Laterality Date   BACK SURGERY     Family History:  Family History  Problem Relation Age of Onset   Heart disease Mother    Lung cancer Father    Colon polyps Neg Hx    Colon cancer Neg Hx    Esophageal cancer Neg Hx    Rectal cancer Neg Hx    Stomach cancer Neg Hx    Family Psychiatric  History: None Social History:  Social History   Substance and Sexual Activity  Alcohol Use Yes   Alcohol/week: 6.0 standard drinks of alcohol   Types: 6 Cans of beer per week     Social History   Substance and Sexual Activity  Drug Use No    Social History   Socioeconomic History   Marital status: Married    Spouse name: Not on file   Number of children: Not on file   Years of education: Not on file   Highest education level: Not on file  Occupational History   Not on file  Tobacco Use   Smoking status: Former    Current packs/day: 0.00    Types: Cigarettes    Quit date: 07/21/1977    Years since quitting: 45.5   Smokeless tobacco: Never  Vaping Use   Vaping status: Never Used  Substance and Sexual Activity   Alcohol use: Yes     Alcohol/week: 6.0 standard drinks of alcohol    Types: 6 Cans of beer per week   Drug use: No   Sexual activity: Not on file  Other Topics Concern   Not on file  Social History Narrative   Not on file   Social Determinants of Health   Financial Resource Strain: Not on file  Food Insecurity: No Food Insecurity (02/09/2023)   Hunger Vital Sign    Worried About Running Out of Food in the Last Year: Never true    Ran Out of Food in the Last Year: Never true  Transportation Needs: No Transportation Needs (02/09/2023)   PRAPARE - Administrator, Civil Service (Medical): No    Lack of Transportation (Non-Medical): No  Physical Activity: Not on file  Stress: Not on file  Social Connections: Unknown (11/20/2021)   Received from [facility], [facility]   Social Network    Social Network: Not on file   Additional Social History:                         Sleep: Good  Appetite:  Good  Current Medications: Current Facility-Administered  Medications  Medication Dose Route Frequency Provider Last Rate Last Admin   acetaminophen (TYLENOL) tablet 650 mg  650 mg Oral Q6H PRN Onuoha, Chinwendu V, NP       allopurinol (ZYLOPRIM) tablet 150 mg  150 mg Oral Daily Onuoha, Chinwendu V, NP   150 mg at 02/11/23 1030   alum & mag hydroxide-simeth (MAALOX/MYLANTA) 200-200-20 MG/5ML suspension 30 mL  30 mL Oral Q4H PRN Onuoha, Chinwendu V, NP       finasteride (PROSCAR) tablet 5 mg  5 mg Oral Daily Onuoha, Chinwendu V, NP   5 mg at 02/11/23 1030   haloperidol (HALDOL) tablet 5 mg  5 mg Oral TID PRN Onuoha, Chinwendu V, NP       Or   haloperidol lactate (HALDOL) injection 5 mg  5 mg Intramuscular TID PRN Onuoha, Chinwendu V, NP       hydrOXYzine (ATARAX) tablet 25 mg  25 mg Oral Q6H PRN Onuoha, Chinwendu V, NP   25 mg at 02/10/23 2105   loperamide (IMODIUM) capsule 2-4 mg  2-4 mg Oral PRN Onuoha, Chinwendu V, NP       LORazepam (ATIVAN) tablet 1 mg  1 mg Oral Q6H PRN Onuoha,  Chinwendu V, NP       magnesium hydroxide (MILK OF MAGNESIA) suspension 30 mL  30 mL Oral Daily PRN Onuoha, Chinwendu V, NP       multivitamin with minerals tablet 1 tablet  1 tablet Oral Daily Onuoha, Chinwendu V, NP   1 tablet at 02/11/23 1031   ondansetron (ZOFRAN-ODT) disintegrating tablet 4 mg  4 mg Oral Q6H PRN Onuoha, Chinwendu V, NP       rosuvastatin (CRESTOR) tablet 5 mg  5 mg Oral Daily Onuoha, Chinwendu V, NP   5 mg at 02/11/23 1030   sertraline (ZOLOFT) tablet 50 mg  50 mg Oral Daily Sarina Ill, DO   50 mg at 02/11/23 1031   traZODone (DESYREL) tablet 50 mg  50 mg Oral QHS PRN Onuoha, Chinwendu V, NP   50 mg at 02/10/23 2105    Lab Results: No results found for this or any previous visit (from the past 48 hour(s)).  Blood Alcohol level:  Lab Results  Component Value Date   ETH <10 02/08/2023   ETH <10 03/28/2018    Metabolic Disorder Labs: Lab Results  Component Value Date   HGBA1C  06/25/2008    6.0 (NOTE)   The ADA recommends the following therapeutic goal for glycemic   control related to Hgb A1C measurement:   Goal of Therapy:   < 7.0% Hgb A1C   Reference: American Diabetes Association: Clinical Practice   Recommendations 2008, Diabetes Care,  2008, 31:(Suppl 1).   MPG 126 06/25/2008   No results found for: "PROLACTIN" Lab Results  Component Value Date   CHOL  06/26/2008    142        ATP III CLASSIFICATION:  <200     mg/dL   Desirable  161-096  mg/dL   Borderline High  >=045    mg/dL   High   TRIG 55 40/98/1191   HDL 35 (L) 06/26/2008   CHOLHDL 4.1 06/26/2008   VLDL 24 06/26/2008   LDLCALC  06/26/2008    83        Total Cholesterol/HDL:CHD Risk Coronary Heart Disease Risk Table                     Men   Women  1/2 Average Risk   3.4   3.3    Physical Findings: AIMS:  , ,  ,  ,    CIWA:  CIWA-Ar Total: 0 COWS:     Musculoskeletal: Strength & Muscle Tone: within normal limits Gait & Station: normal Patient leans: N/A  Psychiatric  Specialty Exam:  Presentation  General Appearance:  Appropriate for Environment; Fairly Groomed  Eye Contact: Poor  Speech: Clear and Coherent; Normal Rate  Speech Volume: Normal  Handedness: Right   Mood and Affect  Mood: Dysphoric; Depressed; Angry  Affect: Other (comment) (Neutral if mildly constricted)   Thought Process  Thought Processes: Linear; Goal Directed; Coherent  Descriptions of Associations:Intact  Orientation:Full (Time, Place and Person)  Thought Content:Logical  History of Schizophrenia/Schizoaffective disorder:No data recorded Duration of Psychotic Symptoms:No data recorded Hallucinations:No data recorded Ideas of Reference:None  Suicidal Thoughts:No data recorded Homicidal Thoughts:No data recorded  Sensorium  Memory: Immediate Good; Recent Good; Remote Good  Judgment: Intact  Insight: Fair   Art therapist  Concentration: Fair  Attention Span: Fair  Recall: Jennelle Human of Knowledge: Fair  Language: Fair   Psychomotor Activity  Psychomotor Activity:No data recorded  Assets  Assets: Housing; Physical Health; Resilience; Leisure Time; Transportation; Vocational/Educational; Health and safety inspector; Desire for Improvement; Talents/Skills; Manufacturing systems engineer; Social Support   Sleep  Sleep:No data recorded    Blood pressure 118/81, pulse 71, temperature 97.7 F (36.5 C), resp. rate 17, height 5\' 11"  (1.803 m), weight 119.7 kg, SpO2 99%. Body mass index is 36.82 kg/m.   Treatment Plan Summary: Daily contact with patient to assess and evaluate symptoms and progress in treatment, Medication management, and Plan continue current medications.  Sarina Ill, DO 02/11/2023, 11:17 AM

## 2023-02-11 NOTE — Progress Notes (Signed)
Patient asked for a laxative despite reporting having a bowel movement yesterday. "I did go yesterday but I like to go every day and today it was hard." Milk of Magnesia 30 mL adm at 1700 for constipation.

## 2023-02-11 NOTE — Plan of Care (Signed)
  Problem: Elimination: Goal: Will not experience complications related to bowel motility Outcome: Progressing Goal: Will not experience complications related to urinary retention Outcome: Progressing   Problem: Safety: Goal: Ability to remain free from injury will improve Outcome: Progressing  Patient is isolative to his room this shift denies SI/HI/A/VH stated he is feeling sad but ready to go back home and look for a job. Q 15 minutes safety checks ongoing. Patient remains safe.

## 2023-02-11 NOTE — Progress Notes (Addendum)
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 Diagnosis of major depressive disorder. 2 days ago patient endorsed HI toward former employer. Today he denies HI. He denies SI/AVH. Pain 0/10. He is A+O x 4.  No distress noted or voiced. Patient was unable to attend group due to him resting in room but did make a few phone calls.  Q15 minute unit checks in place.

## 2023-02-11 NOTE — Group Note (Signed)
Date:  02/11/2023 Time:  2:44 PM  Group Topic/Focus:  Healthy Personal Boundaries  The purpose of this group was to identity the differences between the different groups of personal boundaries such as rigid, porous and healthy boundaries. Also indentifying which boundary was theirs.    Participation Level:  Did Not Attend  Participation Quality:    Affect:    Cognitive:    Insight:   Engagement in Group:    Modes of Intervention:    Additional Comments:  Did not attend    T Noe Gens 02/11/2023, 2:44 PM

## 2023-02-11 NOTE — Plan of Care (Signed)
  Problem: Nutrition: Goal: Adequate nutrition will be maintained Outcome: Progressing   Problem: Coping: Goal: Level of anxiety will decrease Outcome: Progressing   Problem: Elimination: Goal: Will not experience complications related to bowel motility Outcome: Progressing Goal: Will not experience complications related to urinary retention Outcome: Progressing   

## 2023-02-12 NOTE — Progress Notes (Signed)
   02/12/23 1200  Psych Admission Type (Psych Patients Only)  Admission Status Involuntary  Psychosocial Assessment  Patient Complaints None  Eye Contact Brief  Facial Expression Flat  Affect Flat  Speech Soft  Interaction Minimal  Motor Activity Slow  Appearance/Hygiene In scrubs  Behavior Characteristics Cooperative  Mood Labile  Thought Process  Coherency WDL  Content WDL  Delusions None reported or observed  Perception WDL  Hallucination None reported or observed  Judgment WDL  Confusion Mild  Danger to Self  Current suicidal ideation? Denies  Danger to Others  Danger to Others None reported or observed  Danger to Others Abnormal  Harmful Behavior to others No threats or harm toward other people  Destructive Behavior No threats or harm toward property   Uneventful day. Tolerated all medications and meals. Attended groups. Denies SI/HI/AVH.

## 2023-02-12 NOTE — Progress Notes (Signed)
I assumed care for Ricky Bailey at about 19:30. He was resting in his bed, alert, denied any new pain,denied any avh/hi/si at the time of my assessment,he did not have any scheduled meds this evening and did not request for any prns,he did not attend any group.Pt is being monitored as ordered.

## 2023-02-12 NOTE — Progress Notes (Signed)
Patient pleasant and cooperative with care. No scheduled medications due this shift. Requested prn for sleep. Given with good relief. Noted in dayroom socializing with peers. Voiced no concerns or complaints. Encouragement and support provided. Safety checks maintained. Medications given as prescribed. Pt receptive and remains safe on unit with q 15 min checks.

## 2023-02-12 NOTE — Group Note (Signed)
Date:  02/12/2023 Time:  3:26 PM  Group Topic/Focus:  Coping With Mental Health Crisis:   The purpose of this group is to help patients identify strategies for coping with mental health crisis.  Group discusses possible causes of crisis and ways to manage them effectively. Healthy Communication:   The focus of this group is to discuss communication, barriers to communication, as well as healthy ways to communicate with others.  We were able to go outside to get some fresh air and we listened to music as well!    Participation Level:  Active  Participation Quality:  Appropriate  Affect:  Appropriate  Cognitive:  Appropriate  Insight: Appropriate  Engagement in Group:  Engaged  Modes of Intervention:  Discussion  Additional Comments:      L  02/12/2023, 3:26 PM

## 2023-02-12 NOTE — Group Note (Signed)
Recreation Therapy Group Note   Group Topic:Other  Group Date: 02/12/2023 Start Time: 1400 End Time: 1435 Facilitators: Rosina Lowenstein, LRT, CTRS Location: Courtyard   Group Description: Feelings Towards Discharge. Patients, LRT and NT all sat outside while listening to music and getting fresh air and sunlight. The group discussed how they feel about going home, their stay at the hospital, things they have learned, and the first thing they will do when they get home.  Goal Area(s) Addressed: Patient will acknowledge the benefits of sunlight and fresh air to mental health.  Patient will reflect on previous life events surrounding their hospital stay. Patients will communicate with peers and LRT.    Affect/Mood: N/A   Participation Level: Did not attend    Clinical Observations/Individualized Feedback: Drayvin did not attend group.  Plan: Continue to engage patient in RT group sessions 2-3x/week.   Rosina Lowenstein, LRT, CTRS 02/12/2023 3:10 PM

## 2023-02-12 NOTE — Plan of Care (Signed)

## 2023-02-12 NOTE — Plan of Care (Signed)
  Problem: Education: Goal: Knowledge of General Education information will improve Description: Including pain rating scale, medication(s)/side effects and non-pharmacologic comfort measures Outcome: Progressing   Problem: Coping: Goal: Level of anxiety will decrease Outcome: Progressing   Problem: Safety: Goal: Ability to remain free from injury will improve Outcome: Progressing   

## 2023-02-12 NOTE — Progress Notes (Signed)
[facility] MD Progress Note  02/12/2023 2:01 PM Ricky Bailey  MRN:  [id number] Subjective: Ricky Bailey was seen on rounds.  He denies any signs or symptoms and his anger has decreased.  I did not talk to him about his job because it does make him angry.  He has no complaints and nurses report no issues. Principal Problem: MDD (major depressive disorder), single episode, severe , no psychosis (HCC) Diagnosis: Principal Problem:   MDD (major depressive disorder), single episode, severe , no psychosis (HCC)  Total Time spent with patient: 15 minutes  Past Psychiatric History: Unremarkable  Past Medical History:  Past Medical History:  Diagnosis Date   Back pain    BPH (benign prostatic hyperplasia)    Gout    Hyperlipidemia    Hypertension    Obesity    Sleep apnea    Vertigo 03/28/2018   with vommiting    Past Surgical History:  Procedure Laterality Date   BACK SURGERY     Family History:  Family History  Problem Relation Age of Onset   Heart disease Mother    Lung cancer Father    Colon polyps Neg Hx    Colon cancer Neg Hx    Esophageal cancer Neg Hx    Rectal cancer Neg Hx    Stomach cancer Neg Hx    Family Psychiatric  History: Unremarkable Social History:  Social History   Substance and Sexual Activity  Alcohol Use Yes   Alcohol/week: 6.0 standard drinks of alcohol   Types: 6 Cans of beer per week     Social History   Substance and Sexual Activity  Drug Use No    Social History   Socioeconomic History   Marital status: Married    Spouse name: Not on file   Number of children: Not on file   Years of education: Not on file   Highest education level: Not on file  Occupational History   Not on file  Tobacco Use   Smoking status: Former    Current packs/day: 0.00    Types: Cigarettes    Quit date: 07/21/1977    Years since quitting: 45.5   Smokeless tobacco: Never  Vaping Use   Vaping status: Never Used  Substance and Sexual Activity   Alcohol use: Yes     Alcohol/week: 6.0 standard drinks of alcohol    Types: 6 Cans of beer per week   Drug use: No   Sexual activity: Not on file  Other Topics Concern   Not on file  Social History Narrative   Not on file   Social Determinants of Health   Financial Resource Strain: Not on file  Food Insecurity: No Food Insecurity (02/09/2023)   Hunger Vital Sign    Worried About Running Out of Food in the Last Year: Never true    Ran Out of Food in the Last Year: Never true  Transportation Needs: No Transportation Needs (02/09/2023)   PRAPARE - Administrator, Civil Service (Medical): No    Lack of Transportation (Non-Medical): No  Physical Activity: Not on file  Stress: Not on file  Social Connections: Unknown (11/20/2021)   Received from [facility]   Social Network    Social Network: Not on file   Additional Social History:                         Sleep: Good  Appetite:  Good  Current Medications: Current  Facility-Administered Medications  Medication Dose Route Frequency Provider Last Rate Last Admin   acetaminophen (TYLENOL) tablet 650 mg  650 mg Oral Q6H PRN Onuoha, Chinwendu V, NP       allopurinol (ZYLOPRIM) tablet 150 mg  150 mg Oral Daily Onuoha, Chinwendu V, NP   150 mg at 02/12/23 0938   alum & mag hydroxide-simeth (MAALOX/MYLANTA) 200-200-20 MG/5ML suspension 30 mL  30 mL Oral Q4H PRN Onuoha, Chinwendu V, NP       finasteride (PROSCAR) tablet 5 mg  5 mg Oral Daily Onuoha, Chinwendu V, NP   5 mg at 02/12/23 0940   haloperidol (HALDOL) tablet 5 mg  5 mg Oral TID PRN Onuoha, Chinwendu V, NP       Or   haloperidol lactate (HALDOL) injection 5 mg  5 mg Intramuscular TID PRN Onuoha, Chinwendu V, NP       magnesium hydroxide (MILK OF MAGNESIA) suspension 30 mL  30 mL Oral Daily PRN Onuoha, Chinwendu V, NP   30 mL at 02/11/23 1704   multivitamin with minerals tablet 1 tablet  1 tablet Oral Daily Onuoha, Chinwendu V, NP   1 tablet at 02/12/23 0938    rosuvastatin (CRESTOR) tablet 5 mg  5 mg Oral Daily Onuoha, Chinwendu V, NP   5 mg at 02/12/23 0940   sertraline (ZOLOFT) tablet 50 mg  50 mg Oral Daily Sarina Ill, DO   50 mg at 02/12/23 0940   traZODone (DESYREL) tablet 50 mg  50 mg Oral QHS PRN Onuoha, Chinwendu V, NP   50 mg at 02/10/23 2105    Lab Results: No results found for this or any previous visit (from the past 48 hour(s)).  Blood Alcohol level:  Lab Results  Component Value Date   ETH <10 02/08/2023   ETH <10 03/28/2018    Metabolic Disorder Labs: Lab Results  Component Value Date   HGBA1C  06/25/2008    6.0 (NOTE)   The ADA recommends the following therapeutic goal for glycemic   control related to Hgb A1C measurement:   Goal of Therapy:   < 7.0% Hgb A1C   Reference: American Diabetes Association: Clinical Practice   Recommendations 2008, Diabetes Care,  2008, 31:(Suppl 1).   MPG 126 06/25/2008   No results found for: "PROLACTIN" Lab Results  Component Value Date   CHOL  06/26/2008    142        ATP III CLASSIFICATION:  <200     mg/dL   Desirable  409-811  mg/dL   Borderline High  >=914    mg/dL   High   TRIG 55 78/29/5621   HDL 35 (L) 06/26/2008   CHOLHDL 4.1 06/26/2008   VLDL 24 06/26/2008   LDLCALC  06/26/2008    83        Total Cholesterol/HDL:CHD Risk Coronary Heart Disease Risk Table                     Men   Women  1/2 Average Risk   3.4   3.3    Physical Findings: AIMS:  , ,  ,  ,    CIWA:  CIWA-Ar Total: 0 COWS:     Musculoskeletal: Strength & Muscle Tone: within normal limits Gait & Station: normal Patient leans: N/A  Psychiatric Specialty Exam:  Presentation  General Appearance:  Appropriate for Environment; Fairly Groomed  Eye Contact: Poor  Speech: Clear and Coherent; Normal Rate  Speech Volume: Normal  Handedness: Right  Mood and Affect  Mood: Dysphoric; Depressed; Angry  Affect: Other (comment) (Neutral if mildly constricted)   Thought Process   Thought Processes: Linear; Goal Directed; Coherent  Descriptions of Associations:Intact  Orientation:Full (Time, Place and Person)  Thought Content:Logical  History of Schizophrenia/Schizoaffective disorder:No data recorded Duration of Psychotic Symptoms:No data recorded Hallucinations:No data recorded Ideas of Reference:None  Suicidal Thoughts:No data recorded Homicidal Thoughts:No data recorded  Sensorium  Memory: Immediate Good; Recent Good; Remote Good  Judgment: Intact  Insight: Fair   Art therapist  Concentration: Fair  Attention Span: Fair  Recall: Jennelle Human of Knowledge: Fair  Language: Fair   Psychomotor Activity  Psychomotor Activity:No data recorded  Assets  Assets: Housing; Physical Health; Resilience; Leisure Time; Transportation; Vocational/Educational; Health and safety inspector; Desire for Improvement; Talents/Skills; Manufacturing systems engineer; Social Support   Sleep  Sleep:No data recorded    Blood pressure 125/85, pulse (!) 111, temperature (!) 97.2 F (36.2 C), resp. rate 16, height 5\' 11"  (1.803 m), weight 119.7 kg, SpO2 100%. Body mass index is 36.82 kg/m.   Treatment Plan Summary: Daily contact with patient to assess and evaluate symptoms and progress in treatment, Medication management, and Plan continue current medication.  Sarina Ill, DO 02/12/2023, 2:01 PM

## 2023-02-12 NOTE — Group Note (Signed)
Date:  02/12/2023 Time:  11:07 PM  Group Topic/Focus:  Building Self Esteem:   The Focus of this group is helping patients become aware of the effects of self-esteem on their lives, the things they and others do that enhance or undermine their self-esteem, seeing the relationship between their level of self-esteem and the choices they make and learning ways to enhance self-esteem. Coping With Mental Health Crisis:   The purpose of this group is to help patients identify strategies for coping with mental health crisis.  Group discusses possible causes of crisis and ways to manage them effectively. Conflict Resolution:   The focus of this group is to discuss the conflict resolution process and how it may be used upon discharge. Crisis Planning:   The purpose of this group is to help patients create a crisis plan for use upon discharge or in the future, as needed. Developing a Wellness Toolbox:   The focus of this group is to help patients develop a "wellness toolbox" with skills and strategies to promote recovery upon discharge. Early Warning Signs:   The focus of this group is to help patients identify signs or symptoms they exhibit before slipping into an unhealthy state or crisis. Emotional Education:   The focus of this group is to discuss what feelings/emotions are, and how they are experienced. Goals Group:   The focus of this group is to help patients establish daily goals to achieve during treatment and discuss how the patient can incorporate goal setting into their daily lives to aide in recovery. Healthy Communication:   The focus of this group is to discuss communication, barriers to communication, as well as healthy ways to communicate with others. Identifying Needs:   The focus of this group is to help patients identify their personal needs that have been historically problematic and identify healthy behaviors to address their needs. Making Healthy Choices:   The focus of this group is to help  patients identify negative/unhealthy choices they were using prior to admission and identify positive/healthier coping strategies to replace them upon discharge. Overcoming Stress:   The focus of this group is to define stress and help patients assess their triggers. Personal Choices and Values:   The focus of this group is to help patients assess and explore the importance of values in their lives, how their values affect their decisions, how they express their values and what opposes their expression. Self Care:   The focus of this group is to help patients understand the importance of self-care in order to improve or restore emotional, physical, spiritual, interpersonal, and financial health.    Participation Level:  Active  Participation Quality:  Appropriate  Affect:  Appropriate  Cognitive:  Appropriate  Insight: Good  Engagement in Group:  Engaged  Modes of Intervention:  Discussion  Additional Comments:    Ricky Bailey 02/12/2023, 11:07 PM

## 2023-02-13 NOTE — [facility] Suicide Risk Assessment (Signed)
[facility] INPATIENT:  Family/Significant Other Suicide Prevention Education  Suicide Prevention Education:  Patient Refusal for Family/Significant Other Suicide Prevention Education: The patient Ricky Bailey has refused to provide written consent for family/significant other to be provided Family/Significant Other Suicide Prevention Education during admission and/or prior to discharge.  Physician notified.  Elza Rafter 02/13/2023, 10:15 AM

## 2023-02-13 NOTE — Plan of Care (Signed)
  Problem: Clinical Measurements: Goal: Will remain free from infection Outcome: Progressing   Problem: Coping: Goal: Level of anxiety will decrease Outcome: Progressing   

## 2023-02-13 NOTE — Progress Notes (Signed)
   02/13/23 2100  Psych Admission Type (Psych Patients Only)  Admission Status Voluntary  Psychosocial Assessment  Patient Complaints Insomnia  Eye Contact Brief  Facial Expression Flat  Affect Appropriate to circumstance  Speech Soft  Interaction Assertive  Motor Activity Slow  Appearance/Hygiene In scrubs  Behavior Characteristics Cooperative;Calm  Mood Pleasant  Thought Process  Coherency WDL  Content WDL  Delusions None reported or observed  Perception WDL  Hallucination None reported or observed  Judgment Limited  Confusion WDL  Danger to Self  Current suicidal ideation? Denies   Patient calm and cooperative. Patient reports ongoing issue of falling and staying asleep and requests medication. PRN Trazodone given as ordered.

## 2023-02-13 NOTE — Group Note (Signed)
Date:  02/13/2023 Time:  3:03 PM  Group Topic/Focus:  Coping With Mental Health Crisis:   The purpose of this group is to help patients identify strategies for coping with mental health crisis.  Group discusses possible causes of crisis and ways to manage them effectively. Goals Group:   The focus of this group is to help patients establish daily goals to achieve during treatment and discuss how the patient can incorporate goal setting into their daily lives to aide in recovery.    Participation Level:  Active  Participation Quality:  Appropriate  Affect:  Appropriate  Cognitive:  Appropriate  Insight: Appropriate  Engagement in Group:  Engaged  Modes of Intervention:  Discussion  Additional Comments:    Ardelle Anton 02/13/2023, 3:03 PM

## 2023-02-13 NOTE — Progress Notes (Signed)
[facility] MD Progress Note  02/13/2023  Ricky Bailey  MRN:  [id number]  Subjective:  Case discussed with RN and social worker, chart reviewed, patient seen during rounds.  Patient reports that he is feeling better.  He said he was angry at his employer but reports  "I am fine, I do not want to harm anybody" He reports he has been sleeping good, he said best sleep ever".  Patient was encouraged to attend groups and work on coping strategies and safe discharge planning.  He denies thoughts of harming himself or others.  He was cooperative with the assessment.  Principal Problem: MDD (major depressive disorder), single episode, severe , no psychosis (HCC) Diagnosis: Principal Problem:   MDD (major depressive disorder), single episode, severe , no psychosis (HCC)   Past Psychiatric History: Unremarkable  Past Medical History:  Past Medical History:  Diagnosis Date   Back pain    BPH (benign prostatic hyperplasia)    Gout    Hyperlipidemia    Hypertension    Obesity    Sleep apnea    Vertigo 03/28/2018   with vommiting    Past Surgical History:  Procedure Laterality Date   BACK SURGERY     Family History:  Family History  Problem Relation Age of Onset   Heart disease Mother    Lung cancer Father    Colon polyps Neg Hx    Colon cancer Neg Hx    Esophageal cancer Neg Hx    Rectal cancer Neg Hx    Stomach cancer Neg Hx    Family Psychiatric  History: Unremarkable Social History:  Social History   Substance and Sexual Activity  Alcohol Use Yes   Alcohol/week: 6.0 standard drinks of alcohol   Types: 6 Cans of beer per week     Social History   Substance and Sexual Activity  Drug Use No    Social History   Socioeconomic History   Marital status: Married    Spouse name: Not on file   Number of children: Not on file   Years of education: Not on file   Highest education level: Not on file  Occupational History   Not on file  Tobacco Use   Smoking status: Former     Current packs/day: 0.00    Types: Cigarettes    Quit date: 07/21/1977    Years since quitting: 45.5   Smokeless tobacco: Never  Vaping Use   Vaping status: Never Used  Substance and Sexual Activity   Alcohol use: Yes    Alcohol/week: 6.0 standard drinks of alcohol    Types: 6 Cans of beer per week   Drug use: No   Sexual activity: Not on file  Other Topics Concern   Not on file  Social History Narrative   Not on file   Social Determinants of Health   Financial Resource Strain: Not on file  Food Insecurity: No Food Insecurity (02/09/2023)   Hunger Vital Sign    Worried About Running Out of Food in the Last Year: Never true    Ran Out of Food in the Last Year: Never true  Transportation Needs: No Transportation Needs (02/09/2023)   PRAPARE - Administrator, Civil Service (Medical): No    Lack of Transportation (Non-Medical): No  Physical Activity: Not on file  Stress: Not on file  Social Connections: Unknown (11/20/2021)   Received from [facility]   Social Network    Social Network: Not on file  Additional Social History:                         Sleep: Good  Appetite:  Good  Current Medications: Current Facility-Administered Medications  Medication Dose Route Frequency Provider Last Rate Last Admin   acetaminophen (TYLENOL) tablet 650 mg  650 mg Oral Q6H PRN Onuoha, Chinwendu V, NP       allopurinol (ZYLOPRIM) tablet 150 mg  150 mg Oral Daily Onuoha, Chinwendu V, NP   150 mg at 02/13/23 0946   alum & mag hydroxide-simeth (MAALOX/MYLANTA) 200-200-20 MG/5ML suspension 30 mL  30 mL Oral Q4H PRN Onuoha, Chinwendu V, NP       finasteride (PROSCAR) tablet 5 mg  5 mg Oral Daily Onuoha, Chinwendu V, NP   5 mg at 02/13/23 0947   haloperidol (HALDOL) tablet 5 mg  5 mg Oral TID PRN Onuoha, Chinwendu V, NP       Or   haloperidol lactate (HALDOL) injection 5 mg  5 mg Intramuscular TID PRN Onuoha, Chinwendu V, NP       magnesium hydroxide (MILK  OF MAGNESIA) suspension 30 mL  30 mL Oral Daily PRN Onuoha, Chinwendu V, NP   30 mL at 02/11/23 1704   multivitamin with minerals tablet 1 tablet  1 tablet Oral Daily Onuoha, Chinwendu V, NP   1 tablet at 02/13/23 0947   rosuvastatin (CRESTOR) tablet 5 mg  5 mg Oral Daily Onuoha, Chinwendu V, NP   5 mg at 02/13/23 0947   sertraline (ZOLOFT) tablet 50 mg  50 mg Oral Daily Sarina Ill, DO   50 mg at 02/13/23 0947   traZODone (DESYREL) tablet 50 mg  50 mg Oral QHS PRN Onuoha, Chinwendu V, NP   50 mg at 02/12/23 2055    Lab Results: No results found for this or any previous visit (from the past 48 hour(s)).  Blood Alcohol level:  Lab Results  Component Value Date   ETH <10 02/08/2023   ETH <10 03/28/2018    Metabolic Disorder Labs: Lab Results  Component Value Date   HGBA1C  06/25/2008    6.0 (NOTE)   The ADA recommends the following therapeutic goal for glycemic   control related to Hgb A1C measurement:   Goal of Therapy:   < 7.0% Hgb A1C   Reference: American Diabetes Association: Clinical Practice   Recommendations 2008, Diabetes Care,  2008, 31:(Suppl 1).   MPG 126 06/25/2008   No results found for: "PROLACTIN" Lab Results  Component Value Date   CHOL  06/26/2008    142        ATP III CLASSIFICATION:  <200     mg/dL   Desirable  161-096  mg/dL   Borderline High  >=045    mg/dL   High   TRIG 55 40/98/1191   HDL 35 (L) 06/26/2008   CHOLHDL 4.1 06/26/2008   VLDL 24 06/26/2008   LDLCALC  06/26/2008    83        Total Cholesterol/HDL:CHD Risk Coronary Heart Disease Risk Table                     Men   Women  1/2 Average Risk   3.4   3.3    Physical Findings: AIMS:  , ,  ,  ,    CIWA:  CIWA-Ar Total: 0 COWS:     Musculoskeletal: Strength & Muscle Tone: within normal limits Gait & Station:  normal Patient leans: N/A  Psychiatric Specialty Exam:  Presentation  General Appearance:  Appropriate for Environment; Fairly Groomed  Eye  Contact: Good  Speech: Clear and Coherent; Normal Rate  Speech Volume: Normal  Handedness: Right   Mood and Affect  Mood: Good  Affect: Stable   Thought Process  Thought Processes: Linear; Goal Directed; Coherent  Descriptions of Associations:Intact  Orientation:Full (Time, Place and Person)  Thought Content:Logical  Hallucinations:Denies Ideas of Reference:Denies  Suicidal Thoughts:Denies Homicidal Thoughts:Denies  Sensorium  Memory: Immediate Good; Recent Good; Remote Good  Judgment: Intact  Insight: Fair   Chartered certified accountant: Fair  Attention Span: Fair  Recall: Jennelle Human of Knowledge: Fair  Language: Fair   Psychomotor Activity  Psychomotor Activity:Normal  Assets  Assets: Housing; Physical Health; Resilience; Leisure Time; Transportation; Vocational/Educational; Health and safety inspector; Desire for Improvement; Talents/Skills; Manufacturing systems engineer; Social Support   Sleep  Sleep:good    Blood pressure (!) 144/89, pulse 60, temperature 98.1 F (36.7 C), resp. rate 18, height 5\' 11"  (1.803 m), weight 119.7 kg, SpO2 95%. Body mass index is 36.82 kg/m.   Treatment Plan Summary: Daily contact with patient to assess and evaluate symptoms and progress in treatment, Medication management, and Plan continue current medication.  Lewanda Rife, MD

## 2023-02-13 NOTE — [facility] Group Notes (Signed)
RT group was not held due to MHT group being in progress in the dayroom. LRT checked in with patients individually afterwards.     LRT, [address]

## 2023-02-13 NOTE — Plan of Care (Signed)
D- Patient alert and oriented. Affect is pleasant mood is slightly isolative Denies SI, HI, AVH, and pain. Pt is cooperative with medications and attended afternoon group A- Scheduled medications administered to patient, per MD orders. Support and encouragement provided.  Routine safety checks conducted every 15 minutes.  Patient informed to notify staff with problems or concerns.  R- No adverse drug reactions noted. Patient contracts for safety at this time. Patient compliant with medications and treatment plan. Patient receptive, calm, and cooperative. Patient interacts well with others on the unit.  Patient remains safe at this time.

## 2023-02-13 NOTE — Group Note (Signed)
Date:  02/13/2023 Time:  8:44 PM  Group Topic/Focus:  Self Care:   The focus of this group is to help patients understand the importance of self-care in order to improve or restore emotional, physical, spiritual, interpersonal, and financial health.    Participation Level:  Active  Participation Quality:  Attentive  Affect:  Appropriate  Cognitive:  Oriented  Insight: Good  Engagement in Group:  Engaged  Modes of Intervention:  Discussion  Additional Comments:  Adult unit workbook pg.54-56 on self-care  Roberto Scales 02/13/2023, 8:44 PM

## 2023-02-14 NOTE — Group Note (Signed)
Date:  02/14/2023 Time:  8:45 PM  Group Topic/Focus:  Wrap-Up Group:   The focus of this group is to help patients review their daily goal of treatment and discuss progress on daily workbooks. Today we discussed what each pt. was thankful for and how their day was.    Participation Level:  Active  Participation Quality:  Attentive  Affect:  Appropriate  Cognitive:  Appropriate and Oriented  Insight: Good  Engagement in Group:  Engaged  Modes of Intervention:  Discussion  Additional Comments:  Today we discussed what each pt. was thankful for and how their day was.  Roberto Scales 02/14/2023, 8:45 PM

## 2023-02-14 NOTE — Progress Notes (Signed)
Patient was admitted to [facility] on 8/2 as a voluntary admission for MDD with H/I ideations. Currently patient is calm, cooperative with both staff and peers. He ambulates and does ADL's independently. No c/o of pain, denies H/I, S/I and AVH. Will continue to monitor. Plan is to discharge home tomorrow.

## 2023-02-14 NOTE — Progress Notes (Signed)
[facility] MD Progress Note  02/14/2023  Dannell Letsch  MRN:  [id number]  Subjective:  Case discussed with RN and social worker, chart reviewed, patient seen during rounds.  Patient reports that he is feeling better.  Patient reports he has been attending groups and working on coping strategies and safe discharge planning.  He denies thoughts of harming himself or others.  He was cooperative with the assessment.  Patient feels close to his baseline and is requesting to leave tomorrow.  Patient was encouraged to work on a safe discharge plan with the Child psychotherapist.  Principal Problem: MDD (major depressive disorder), single episode, severe , no psychosis (HCC) Diagnosis: Principal Problem:   MDD (major depressive disorder), single episode, severe , no psychosis (HCC)   Past Psychiatric History: Unremarkable  Past Medical History:  Past Medical History:  Diagnosis Date   Back pain    BPH (benign prostatic hyperplasia)    Gout    Hyperlipidemia    Hypertension    Obesity    Sleep apnea    Vertigo 03/28/2018   with vommiting    Past Surgical History:  Procedure Laterality Date   BACK SURGERY     Family History:  Family History  Problem Relation Age of Onset   Heart disease Mother    Lung cancer Father    Colon polyps Neg Hx    Colon cancer Neg Hx    Esophageal cancer Neg Hx    Rectal cancer Neg Hx    Stomach cancer Neg Hx    Family Psychiatric  History: Unremarkable Social History:  Social History   Substance and Sexual Activity  Alcohol Use Yes   Alcohol/week: 6.0 standard drinks of alcohol   Types: 6 Cans of beer per week     Social History   Substance and Sexual Activity  Drug Use No    Social History   Socioeconomic History   Marital status: Married    Spouse name: Not on file   Number of children: Not on file   Years of education: Not on file   Highest education level: Not on file  Occupational History   Not on file  Tobacco Use   Smoking status: Former     Current packs/day: 0.00    Types: Cigarettes    Quit date: 07/21/1977    Years since quitting: 45.6   Smokeless tobacco: Never  Vaping Use   Vaping status: Never Used  Substance and Sexual Activity   Alcohol use: Yes    Alcohol/week: 6.0 standard drinks of alcohol    Types: 6 Cans of beer per week   Drug use: No   Sexual activity: Not on file  Other Topics Concern   Not on file  Social History Narrative   Not on file   Social Determinants of Health   Financial Resource Strain: Not on file  Food Insecurity: No Food Insecurity (02/09/2023)   Hunger Vital Sign    Worried About Running Out of Food in the Last Year: Never true    Ran Out of Food in the Last Year: Never true  Transportation Needs: No Transportation Needs (02/09/2023)   PRAPARE - Administrator, Civil Service (Medical): No    Lack of Transportation (Non-Medical): No  Physical Activity: Not on file  Stress: Not on file  Social Connections: Unknown (11/20/2021)   Received from [facility]   Social Network    Social Network: Not on file   Additional Social History:  Sleep: Good  Appetite:  Good  Current Medications: Current Facility-Administered Medications  Medication Dose Route Frequency Provider Last Rate Last Admin   acetaminophen (TYLENOL) tablet 650 mg  650 mg Oral Q6H PRN Onuoha, Chinwendu V, NP       allopurinol (ZYLOPRIM) tablet 150 mg  150 mg Oral Daily Onuoha, Chinwendu V, NP   150 mg at 02/14/23 0959   alum & mag hydroxide-simeth (MAALOX/MYLANTA) 200-200-20 MG/5ML suspension 30 mL  30 mL Oral Q4H PRN Onuoha, Chinwendu V, NP       finasteride (PROSCAR) tablet 5 mg  5 mg Oral Daily Onuoha, Chinwendu V, NP   5 mg at 02/14/23 0959   haloperidol (HALDOL) tablet 5 mg  5 mg Oral TID PRN Onuoha, Chinwendu V, NP       Or   haloperidol lactate (HALDOL) injection 5 mg  5 mg Intramuscular TID PRN Onuoha, Chinwendu V, NP       magnesium hydroxide (MILK  OF MAGNESIA) suspension 30 mL  30 mL Oral Daily PRN Onuoha, Chinwendu V, NP   30 mL at 02/11/23 1704   multivitamin with minerals tablet 1 tablet  1 tablet Oral Daily Onuoha, Chinwendu V, NP   1 tablet at 02/14/23 0958   rosuvastatin (CRESTOR) tablet 5 mg  5 mg Oral Daily Onuoha, Chinwendu V, NP   5 mg at 02/14/23 0959   sertraline (ZOLOFT) tablet 50 mg  50 mg Oral Daily Sarina Ill, DO   50 mg at 02/14/23 0959   traZODone (DESYREL) tablet 50 mg  50 mg Oral QHS PRN Onuoha, Chinwendu V, NP   50 mg at 02/13/23 2047    Lab Results: No results found for this or any previous visit (from the past 48 hour(s)).  Blood Alcohol level:  Lab Results  Component Value Date   ETH <10 02/08/2023   ETH <10 03/28/2018    Metabolic Disorder Labs: Lab Results  Component Value Date   HGBA1C  06/25/2008    6.0 (NOTE)   The ADA recommends the following therapeutic goal for glycemic   control related to Hgb A1C measurement:   Goal of Therapy:   < 7.0% Hgb A1C   Reference: American Diabetes Association: Clinical Practice   Recommendations 2008, Diabetes Care,  2008, 31:(Suppl 1).   MPG 126 06/25/2008   No results found for: "PROLACTIN" Lab Results  Component Value Date   CHOL  06/26/2008    142        ATP III CLASSIFICATION:  <200     mg/dL   Desirable  409-811  mg/dL   Borderline High  >=914    mg/dL   High   TRIG 55 78/29/5621   HDL 35 (L) 06/26/2008   CHOLHDL 4.1 06/26/2008   VLDL 24 06/26/2008   LDLCALC  06/26/2008    83        Total Cholesterol/HDL:CHD Risk Coronary Heart Disease Risk Table                     Men   Women  1/2 Average Risk   3.4   3.3    Physical Findings: AIMS:  , ,  ,  ,    CIWA:  CIWA-Ar Total: 0 COWS:     Musculoskeletal: Strength & Muscle Tone: within normal limits Gait & Station: normal Patient leans: N/A  Psychiatric Specialty Exam:  Presentation  General Appearance:  Appropriate for Environment; Fairly Groomed  Eye  Contact: Good  Speech: Clear  and Coherent; Normal Rate  Speech Volume: Normal  Handedness: Right   Mood and Affect  Mood: Good  Affect: Stable   Thought Process  Thought Processes: Linear; Goal Directed; Coherent  Descriptions of Associations:Intact  Orientation:Full (Time, Place and Person)  Thought Content:Logical  Hallucinations:Denies Ideas of Reference:Denies  Suicidal Thoughts:Denies Homicidal Thoughts:Denies  Sensorium  Memory: Immediate Good; Recent Good; Remote Good  Judgment: Intact  Insight: Fair   Chartered certified accountant: Fair  Attention Span: Fair  Recall: Jennelle Human of Knowledge: Fair  Language: Fair   Psychomotor Activity  Psychomotor Activity:Normal  Assets  Assets: Housing; Physical Health; Resilience; Leisure Time; Transportation; Vocational/Educational; Health and safety inspector; Desire for Improvement; Talents/Skills; Manufacturing systems engineer; Social Support   Sleep  Sleep:good    Blood pressure (!) 133/96, pulse 67, temperature 97.8 F (36.6 C), resp. rate 18, height 5\' 11"  (1.803 m), weight 119.7 kg, SpO2 98%. Body mass index is 36.82 kg/m.   Treatment Plan Summary: Daily contact with patient to assess and evaluate symptoms and progress in treatment, Medication management, and Plan continue current medication.  Lewanda Rife, MD

## 2023-02-14 NOTE — BH IP Treatment Plan (Signed)
Interdisciplinary Treatment and Diagnostic Plan Update  02/14/2023 Time of Session: 9:30 AM  Ricky Bailey MRN: [id number]  Principal Diagnosis: MDD (major depressive disorder), single episode, severe , no psychosis (HCC)  Secondary Diagnoses: Principal Problem:   MDD (major depressive disorder), single episode, severe , no psychosis (HCC)   Current Medications:  Current Facility-Administered Medications  Medication Dose Route Frequency Provider Last Rate Last Admin   acetaminophen (TYLENOL) tablet 650 mg  650 mg Oral Q6H PRN Onuoha, Chinwendu V, NP       allopurinol (ZYLOPRIM) tablet 150 mg  150 mg Oral Daily Onuoha, Chinwendu V, NP   150 mg at 02/14/23 0959   alum & mag hydroxide-simeth (MAALOX/MYLANTA) 200-200-20 MG/5ML suspension 30 mL  30 mL Oral Q4H PRN Onuoha, Chinwendu V, NP       finasteride (PROSCAR) tablet 5 mg  5 mg Oral Daily Onuoha, Chinwendu V, NP   5 mg at 02/14/23 0959   haloperidol (HALDOL) tablet 5 mg  5 mg Oral TID PRN Onuoha, Chinwendu V, NP       Or   haloperidol lactate (HALDOL) injection 5 mg  5 mg Intramuscular TID PRN Onuoha, Chinwendu V, NP       magnesium hydroxide (MILK OF MAGNESIA) suspension 30 mL  30 mL Oral Daily PRN Onuoha, Chinwendu V, NP   30 mL at 02/11/23 1704   multivitamin with minerals tablet 1 tablet  1 tablet Oral Daily Onuoha, Chinwendu V, NP   1 tablet at 02/14/23 0958   rosuvastatin (CRESTOR) tablet 5 mg  5 mg Oral Daily Onuoha, Chinwendu V, NP   5 mg at 02/14/23 0959   sertraline (ZOLOFT) tablet 50 mg  50 mg Oral Daily Sarina Ill, DO   50 mg at 02/14/23 0959   traZODone (DESYREL) tablet 50 mg  50 mg Oral QHS PRN Onuoha, Chinwendu V, NP   50 mg at 02/13/23 2047   PTA Medications: Medications Prior to Admission  Medication Sig Dispense Refill Last Dose   allopurinol (ZYLOPRIM) 300 MG tablet Take 300 mg by mouth daily.  0    amLODipine (NORVASC) 10 MG tablet Take 1 tablet (10 mg total) by mouth daily. 30 tablet 0    finasteride  (PROSCAR) 5 MG tablet Take 1 tablet (5 mg total) by mouth daily. 30 tablet 1    lisinopril (ZESTRIL) 20 MG tablet Take 20 mg by mouth daily.      rosuvastatin (CRESTOR) 20 MG tablet Take 20 mg by mouth daily.      terazosin (HYTRIN) 1 MG capsule Take 1 mg by mouth at bedtime.       Patient Stressors: Financial difficulties   Loss of work    Patient Strengths: Ability for insight  Capable of independent living  Printmaker for treatment/growth  Supportive family/friends   Treatment Modalities: Medication Management, Group therapy, Case management,  1 to 1 session with clinician, Psychoeducation, Recreational therapy.   Physician Treatment Plan for Primary Diagnosis: MDD (major depressive disorder), single episode, severe , no psychosis (HCC) Long Term Goal(s): Improvement in symptoms so as ready for discharge   Short Term Goals: Ability to identify changes in lifestyle to reduce recurrence of condition will improve  Medication Management: Evaluate patient's response, side effects, and tolerance of medication regimen.  Therapeutic Interventions: 1 to 1 sessions, Unit Group sessions and Medication administration.  Evaluation of Outcomes: Progressing  Physician Treatment Plan for Secondary Diagnosis: Principal Problem:   MDD (major depressive disorder), single episode,  severe , no psychosis (HCC)  Long Term Goal(s): Improvement in symptoms so as ready for discharge   Short Term Goals: Ability to identify changes in lifestyle to reduce recurrence of condition will improve     Medication Management: Evaluate patient's response, side effects, and tolerance of medication regimen.  Therapeutic Interventions: 1 to 1 sessions, Unit Group sessions and Medication administration.  Evaluation of Outcomes: Progressing   RN Treatment Plan for Primary Diagnosis: MDD (major depressive disorder), single episode, severe , no psychosis (HCC) Long Term Goal(s): Knowledge of  disease and therapeutic regimen to maintain health will improve  Short Term Goals: Ability to remain free from injury will improve, Ability to verbalize frustration and anger appropriately will improve, Ability to demonstrate self-control, Ability to participate in decision making will improve, Ability to verbalize feelings will improve, Ability to disclose and discuss suicidal ideas, Ability to identify and develop effective coping behaviors will improve, and Compliance with prescribed medications will improve  Medication Management: RN will administer medications as ordered by provider, will assess and evaluate patient's response and provide education to patient for prescribed medication. RN will report any adverse and/or side effects to prescribing provider.  Therapeutic Interventions: 1 on 1 counseling sessions, Psychoeducation, Medication administration, Evaluate responses to treatment, Monitor vital signs and CBGs as ordered, Perform/monitor CIWA, COWS, AIMS and Fall Risk screenings as ordered, Perform wound care treatments as ordered.  Evaluation of Outcomes: Progressing   LCSW Treatment Plan for Primary Diagnosis: MDD (major depressive disorder), single episode, severe , no psychosis (HCC) Long Term Goal(s): Safe transition to appropriate next level of care at discharge, Engage patient in therapeutic group addressing interpersonal concerns.  Short Term Goals: Engage patient in aftercare planning with referrals and resources, Increase social support, Increase ability to appropriately verbalize feelings, Increase emotional regulation, Facilitate acceptance of mental health diagnosis and concerns, and Increase skills for wellness and recovery  Therapeutic Interventions: Assess for all discharge needs, 1 to 1 time with Social worker, Explore available resources and support systems, Assess for adequacy in community support network, Educate family and significant other(s) on suicide prevention,  Complete Psychosocial Assessment, Interpersonal group therapy.  Evaluation of Outcomes: Progressing   Progress in Treatment: Attending groups: Yes. Participating in groups: Yes. Taking medication as prescribed: Yes. Toleration medication: Yes. Family/Significant other contact made: No, will contact:  CSW will contact if given permission  Patient understands diagnosis: Yes. Discussing patient identified problems/goals with staff: Yes. Medical problems stabilized or resolved: Yes. Denies suicidal/homicidal ideation: Yes. Issues/concerns per patient self-inventory: No. Other: None    New problem(s) identified: No, Describe:  None identified Update 02/14/23: No changes at this time    New Short Term/Long Term Goal(s): elimination of symptoms of psychosis, medication management for mood stabilization; elimination of SI thoughts; development of comprehensive mental wellness plan. Update 02/14/23: No changes at this time    Patient Goals:  "I shouldn't even be here, if I stay the hell away from people I'll be alright" Update 02/14/23: No changes at this time    Discharge Plan or Barriers: CSW will assist with appropriate discharge planning Update 02/14/23: No changes at this time    Reason for Continuation of Hospitalization: Depression Medication stabilization   Estimated Length of Stay: 1 to 7 days Update 02/14/23: Estimated discharge is 02/15/2023   Last 3 [place] Suicide Severity Risk Score: Flowsheet Row Admission (Current) from 02/09/2023 in [facility] from 02/08/2023 in [facility] Department at [facility] ED from  05/28/2022 in [facility] Department at [facility]  C-SSRS RISK CATEGORY No Risk No Risk No Risk       Last PHQ 2/9 Scores:     No data to display          Scribe for Treatment Team: Elza Rafter, Theresia Majors 02/14/2023 1:27 PM

## 2023-02-14 NOTE — Group Note (Signed)
Recreation Therapy Group Note   Group Topic:Relaxation  Group Date: 02/14/2023 Start Time: 1400 End Time: 1440 Facilitators: Rosina Lowenstein, LRT, CTRS Location:  Craft Room  Group Description: Meditation. LRT and patients discussed what they know about meditation and mindfulness. LRT played a Deep Breathing Meditation exercise script for patients to follow along to while the lights are dimmed in the room. LRT and patients discussed how meditation and deep breathing can be used as a coping skill postdischarge to relieve stress and anxiety.   Goal Area(s) Addressed: Patient will practice using relaxation technique. Patient will identify a new coping skill.  Patient will follow multistep directions to reduce anxiety and stress.   Affect/Mood: Appropriate   Participation Level: Active and Engaged   Participation Quality: Independent   Behavior: Appropriate, Calm, and Cooperative   Speech/Thought Process: Coherent   Insight: Good   Judgement: Good   Modes of Intervention: Activity   Patient Response to Interventions:  Attentive, Engaged, Interested , and Receptive   Education Outcome:  Acknowledges education   Clinical Observations/Individualized Feedback: Renell was active in their participation of session activities and group discussion. Pt identified "I have done meditation before where you picture yourself at a beach or something." Pt interacted well with LRT and peers duration of session.   Plan: Continue to engage patient in RT group sessions 2-3x/week.   Rosina Lowenstein, LRT, CTRS 02/14/2023 2:50 PM

## 2023-02-15 MED ORDER — ROSUVASTATIN CALCIUM 5 MG PO TABS: 5.0000 mg | ORAL_TABLET | Freq: Every day | ORAL | 0 refills | Status: DC

## 2023-02-15 MED ORDER — ADULT MULTIVITAMIN W/MINERALS CH: 1.0000 | ORAL_TABLET | Freq: Every day | ORAL | 0 refills | Status: DC

## 2023-02-15 MED ORDER — SERTRALINE HCL 50 MG PO TABS: 50.0000 mg | ORAL_TABLET | Freq: Every day | ORAL | 0 refills | Status: DC

## 2023-02-15 MED ORDER — TRAZODONE HCL 50 MG PO TABS: 50.0000 mg | ORAL_TABLET | Freq: Every evening | ORAL | 0 refills | Status: DC | PRN

## 2023-02-15 MED ORDER — ALLOPURINOL 300 MG PO TABS: 150.0000 mg | ORAL_TABLET | Freq: Every day | ORAL | 0 refills | Status: AC

## 2023-02-15 MED ORDER — FINASTERIDE 5 MG PO TABS: 5.0000 mg | ORAL_TABLET | Freq: Every day | ORAL | 0 refills | Status: AC

## 2023-02-15 NOTE — BHH Suicide Risk Assessment (Signed)
[facility] Discharge Suicide Risk Assessment   Principal Problem: MDD (major depressive disorder), single episode, severe , no psychosis (HCC) Discharge Diagnoses: Principal Problem:   MDD (major depressive disorder), single episode, severe , no psychosis (HCC)   Musculoskeletal: Strength & Muscle Tone: within normal limits Gait & Station: normal Patient leans: N/A   Psychiatric Specialty Exam:   Presentation  General Appearance:  Appropriate for Environment; Fairly Groomed   Eye Contact: Good   Speech: Clear and Coherent; Normal Rate   Speech Volume: Normal   Handedness: Right     Mood and Affect  Mood: Good   Affect: Stable     Thought Process  Thought Processes: Linear; Goal Directed; Coherent   Descriptions of Associations:Intact   Orientation:Full (Time, Place and Person)   Thought Content:Logical   Hallucinations:Denies Ideas of Reference:Denies   Suicidal Thoughts:Denies Homicidal Thoughts:Denies   Sensorium  Memory: Immediate Good; Recent Good; Remote Good   Judgment: Intact   Insight: Fair     Chartered certified accountant: Fair   Attention Span: Fair   Recall: Jennelle Human of Knowledge: Fair   Language: Fair     Psychomotor Activity  Psychomotor Activity:Normal   Assets  Assets: Housing; Physical Health; Resilience; Leisure Time; Transportation; Vocational/Educational; Health and safety inspector; Desire for Improvement; Talents/Skills; Manufacturing systems engineer; Social Support     Sleep  Sleep:good  Physical Exam Vitals and nursing note reviewed.  Constitutional:      Appearance: Normal appearance. He is normal weight.  HENT:     Head: Normocephalic and atraumatic.       Eyes:     Extraocular Movements: Extraocular movements intact.     Pupils: Pupils are equal, round, and reactive to light.  Cardiovascular:     Rate and Rhythm: Normal rate and regular rhythm.     Pulses: Normal pulses.  Pulmonary:      Effort: Pulmonary effort is normal.      Abdominal:     General: Abdomen is flat. Bowel sounds are normal.       Musculoskeletal:        General: Normal range of motion.  .  Skin:    General: Skin is warm and dry.  Neurological:     General: No focal deficit present.     Mental Status: He is alert and oriented to person, place, and time.    Review of Systems  Constitutional: Negative.   HENT: Negative.    Eyes: Negative.   Respiratory: Negative.    Cardiovascular: Negative.   Gastrointestinal: Negative.   Genitourinary: Negative.   Musculoskeletal: Negative.   Psychiatric/Behavioral: Negative.      Blood pressure (!) 153/98, pulse 69, temperature 97.9 F (36.6 C), resp. rate 18, height 5\' 11"  (1.803 m), weight 119.7 kg, SpO2 96%. Body mass index is 36.82 kg/m.   Demographic Factors:  Male, Age 33 or older, Low socioeconomic status, Living alone, and Unemployed  Loss Factors: Financial problems/change in socioeconomic status and loss of employment  Historical Factors: Impulsivity  Risk Reduction Factors:   Positive therapeutic relationship and Positive coping skills or problem solving skills    Cognitive Features That Contribute To Risk:  None    Suicide Risk:  Minimal: No identifiable suicidal ideation.   Plan Of Care/Follow-up recommendations:  Per Discharge Summary  Lewanda Rife, MD 02/15/2023, 9:46 AM

## 2023-02-15 NOTE — Progress Notes (Signed)

## 2023-02-15 NOTE — Care Management Important Message (Signed)
Important Message  Patient Details  Name: Ricky Bailey MRN: [id number] Date of Birth: [birth year]   Medicare Important Message Given:  Yes     Harden Mo, LCSW 02/15/2023, 10:26 AM

## 2023-02-15 NOTE — Progress Notes (Signed)
   02/15/23 1000  Psych Admission Type (Psych Patients Only)  Admission Status Voluntary  Psychosocial Assessment  Patient Complaints None  Eye Contact Brief  Facial Expression Flat  Affect Appropriate to circumstance  Speech Soft  Interaction Assertive  Motor Activity Slow  Appearance/Hygiene In scrubs  Behavior Characteristics Cooperative  Mood Pleasant  Thought Process  Coherency WDL  Content WDL  Delusions None reported or observed  Perception WDL  Hallucination None reported or observed  Judgment Limited  Confusion WDL  Danger to Self  Current suicidal ideation? Denies  Danger to Others  Danger to Others None reported or observed  Danger to Others Abnormal  Harmful Behavior to others No threats or harm toward other people  Destructive Behavior No threats or harm toward property   Patient discharged to home and picked up by family. All discharge instructions and prescriptions given and read to patient with understanding. Denies any HI/SI/AVH upon discharge.

## 2023-02-15 NOTE — Discharge Summary (Signed)
Physician Discharge Summary Note  Patient:  Ricky Bailey is an 67 y.o., male MRN:  [id number] DOB:  07-25-1955 Patient phone:  [phone] (home)  Patient address:   [address] [place] [address],    Date of Admission:  02/09/2023 Date of Discharge: 02/15/2023  Reason for Admission:  Ricky Bailey is a 67 year old African-American male who was voluntarily admitted to inpatient psychiatry. Ricky Bailey lost his job recently after testing positive for opioids. He says that it was an inaccurate test and he had 4 subsequent tests that were negative. He has been a Naval architect for 20 years and this has really made him angry. Denies any depression or auditory or visual hallucinations. He does not have any psychiatric history.   Principal Problem: MDD (major depressive disorder), single episode, severe , no psychosis (HCC) Discharge Diagnoses: Principal Problem:   MDD (major depressive disorder), single episode, severe , no psychosis (HCC)   Past Psychiatric History: None reported by the patient  Past Medical History:  Past Medical History:  Diagnosis Date   Back pain    BPH (benign prostatic hyperplasia)    Gout    Hyperlipidemia    Hypertension    Obesity    Sleep apnea    Vertigo 03/28/2018   with vommiting    Past Surgical History:  Procedure Laterality Date   BACK SURGERY     Family History:  Family History  Problem Relation Age of Onset   Heart disease Mother    Lung cancer Father    Colon polyps Neg Hx    Colon cancer Neg Hx    Esophageal cancer Neg Hx    Rectal cancer Neg Hx    Stomach cancer Neg Hx     Social History:  Social History   Substance and Sexual Activity  Alcohol Use Yes   Alcohol/week: 6.0 standard drinks of alcohol   Types: 6 Cans of beer per week     Social History   Substance and Sexual Activity  Drug Use No    Social History   Socioeconomic History   Marital status: Married    Spouse name: Not on file   Number of children: Not  on file   Years of education: Not on file   Highest education level: Not on file  Occupational History   Not on file  Tobacco Use   Smoking status: Former    Current packs/day: 0.00    Types: Cigarettes    Quit date: 07/21/1977    Years since quitting: 45.6   Smokeless tobacco: Never  Vaping Use   Vaping status: Never Used  Substance and Sexual Activity   Alcohol use: Yes    Alcohol/week: 6.0 standard drinks of alcohol    Types: 6 Cans of beer per week   Drug use: No   Sexual activity: Not on file  Other Topics Concern   Not on file  Social History Narrative   Not on file   Social Determinants of Health   Financial Resource Strain: Not on file  Food Insecurity: No Food Insecurity (02/09/2023)   Hunger Vital Sign    Worried About Running Out of Food in the Last Year: Never true    Ran Out of Food in the Last Year: Never true  Transportation Needs: No Transportation Needs (02/09/2023)   PRAPARE - Administrator, Civil Service (Medical): No    Lack of Transportation (Non-Medical): No  Physical Activity: Not on file  Stress: Not on  file  Social Connections: Unknown (11/20/2021)   Received from [facility]   Social Network    Social Network: Not on file    Hospital Course:   The patient was admitted to Inpatient psychiatric treatment for stabilization of suicidal and homicidal ideations. Patient was placed on suicidal precautions. The patient was evaluated and treated by the multidisciplinary treatment team including physicians, nurses, social workers and therapists. All medications were presented to the patient and the Patient gave consent to all the medications that they were given, as well as was explained the risks, benefits, side effects and alternatives of all medication therapies. The patient was integrated into the general milieu on the ward and encouraged to attend to his ADLs and participate in all groups and activities. During hospital course  the Patient attended coping skill groups, music therapy and activity therapy groups. Patient was counseled on cognitive techniques/skills by multiple staff members and given support care by the staff.  Patient's medication regimen was evaluated and titrated to therapeutic levels to better Patient's overall daily functioning. Specifically, the patient was started on Zoloft for depression.  He tolerated the medication well with no significant side effects.  Patient reported that attending groups and working on coping strategies have helped him.  He said that he made some statements because he lost his job and was upset about it.  He has no intention to harm his employer or anyone else.  During the hospitalization, the patient demonstrated a stabilization of mood with decreased racing thoughts, decreased impulsivity, improved sleep and decreased irritability. At the time of discharge, the patient denied any suicidal ideation/homicidal ideation, no intention or plan ,and was not overtly depressed, manic or psychotic. The Patient was interacting well in groups and on the unit with their peers. Patient was able to identify a safety plan to include speaking with family, contacting outpatient provider or calling 911 if hallucinations/delusions returned or worsened or thoughts of self-harm or suicide return. Patient was counselled on outpatient follow-up that was arranged prior to discharge.   Musculoskeletal: Strength & Muscle Tone: within normal limits Gait & Station: normal Patient leans: N/A   Psychiatric Specialty Exam:   Presentation  General Appearance:  Appropriate for Environment; Fairly Groomed   Eye Contact: Good   Speech: Clear and Coherent; Normal Rate   Speech Volume: Normal   Handedness: Right     Mood and Affect  Mood: Good   Affect: Stable     Thought Process  Thought Processes: Linear; Goal Directed; Coherent   Descriptions of Associations:Intact    Orientation:Full (Time, Place and Person)   Thought Content:Logical   Hallucinations:Denies Ideas of Reference:Denies   Suicidal Thoughts:Denies Homicidal Thoughts:Denies   Sensorium  Memory: Immediate Good; Recent Good; Remote Good   Judgment: Intact   Insight: Fair     Chartered certified accountant: Fair   Attention Span: Fair   Recall: Jennelle Human of Knowledge: Fair   Language: Fair     Psychomotor Activity  Psychomotor Activity:Normal   Assets  Assets: Housing; Physical Health; Resilience; Leisure Time; Transportation; Vocational/Educational; Health and safety inspector; Desire for Improvement; Talents/Skills; Manufacturing systems engineer; Social Support     Sleep  Sleep:good   Physical Exam Vitals and nursing note reviewed.  Constitutional:      Appearance: Normal appearance. He is normal weight.  HENT:     Head: Normocephalic and atraumatic.       Eyes:     Extraocular Movements: Extraocular movements intact.  Pupils: Pupils are equal, round, and reactive to light.  Cardiovascular:     Rate and Rhythm: Normal rate and regular rhythm.     Pulses: Normal pulses.  Pulmonary:     Effort: Pulmonary effort is normal.      Abdominal:     General: Abdomen is flat. Bowel sounds are normal.       Musculoskeletal:        General: Normal range of motion.  .  Skin:    General: Skin is warm and dry.  Neurological:     General: No focal deficit present.     Mental Status: He is alert and oriented to person, place, and time.    Review of Systems  Constitutional: Negative.   HENT: Negative.    Eyes: Negative.   Respiratory: Negative.    Cardiovascular: Negative.   Gastrointestinal: Negative.   Genitourinary: Negative.   Musculoskeletal: Negative.   Psychiatric/Behavioral: Negative.      Blood pressure (!) 153/98, pulse 69, temperature 97.9 F (36.6 C), resp. rate 18, height 5\' 11"  (1.803 m), weight 119.7 kg, SpO2 96%. Body mass index  is 36.82 kg/m.   Social History   Tobacco Use  Smoking Status Former   Current packs/day: 0.00   Types: Cigarettes   Quit date: 07/21/1977   Years since quitting: 45.6  Smokeless Tobacco Never   Tobacco Cessation:  N/A, patient does not currently use tobacco products   Blood Alcohol level:  Lab Results  Component Value Date   ETH <10 02/08/2023   ETH <10 03/28/2018    Metabolic Disorder Labs:  Lab Results  Component Value Date   HGBA1C  06/25/2008    6.0 (NOTE)   The ADA recommends the following therapeutic goal for glycemic   control related to Hgb A1C measurement:   Goal of Therapy:   < 7.0% Hgb A1C   Reference: American Diabetes Association: Clinical Practice   Recommendations 2008, Diabetes Care,  2008, 31:(Suppl 1).   MPG 126 06/25/2008   No results found for: "PROLACTIN" Lab Results  Component Value Date   CHOL  06/26/2008    142        ATP III CLASSIFICATION:  <200     mg/dL   Desirable  161-096  mg/dL   Borderline High  >=045    mg/dL   High   TRIG 55 40/98/1191   HDL 35 (L) 06/26/2008   CHOLHDL 4.1 06/26/2008   VLDL 24 06/26/2008   LDLCALC  06/26/2008    83        Total Cholesterol/HDL:CHD Risk Coronary Heart Disease Risk Table                     Men   Women  1/2 Average Risk   3.4   3.3    See Psychiatric Specialty Exam and Suicide Risk Assessment completed by Attending Physician prior to discharge.  Discharge destination:  Home  Is patient on multiple antipsychotic therapies at discharge:  No    Recommended Plan for Multiple Antipsychotic Therapies: NA   Allergies as of 02/15/2023   No Known Allergies      Medication List     STOP taking these medications    amLODipine 10 MG tablet Commonly known as: NORVASC   lisinopril 20 MG tablet Commonly known as: ZESTRIL   terazosin 1 MG capsule Commonly known as: HYTRIN       TAKE these medications      Indication  allopurinol  300 MG tablet Commonly known as: ZYLOPRIM Take 0.5  tablets (150 mg total) by mouth daily. Start taking on: February 16, 2023 What changed: how much to take  Indication: Gout   finasteride 5 MG tablet Commonly known as: PROSCAR Take 1 tablet (5 mg total) by mouth daily. Start taking on: February 16, 2023    multivitamin with minerals Tabs tablet Take 1 tablet by mouth daily. Start taking on: February 16, 2023    rosuvastatin 5 MG tablet Commonly known as: CRESTOR Take 1 tablet (5 mg total) by mouth daily. Start taking on: February 16, 2023 What changed:  medication strength how much to take    sertraline 50 MG tablet Commonly known as: ZOLOFT Take 1 tablet (50 mg total) by mouth daily. Start taking on: February 16, 2023  Indication: Major Depressive Disorder   traZODone 50 MG tablet Commonly known as: DESYREL Take 1 tablet (50 mg total) by mouth at bedtime as needed for sleep.        PATIENTS CONDITION AT DISCHARGE:  Stable  TOBACCO CESSATION SCREENING  Patient was screened and counselled on smoking cessation at time of discharge.   PRESCRIPTION ARE LOCATED:  On Chart  DISCHARGE INSTRUCTIONS:  1. Diet: Cardiac  2. Activity: As tolerated  3. Take medications as prescribed and not to make any changes without first consulting with the outpatient provider.  4. Patient was advised to avoid any illicit drugs or alcohol due to negative impact on physical and mental health.  5. Patient should keep all follow up appointments.  TIME SPENT ON DISCHARGE: Over 35 minutes were spent on this patients discharge including a face to face encounter, patient counseling and preparation of discharge materials.  Signed: Lewanda Rife, MD

## 2023-02-15 NOTE — Progress Notes (Signed)
  [facility] Adult Case Management Discharge Plan :  Will you be returning to the same living situation after discharge:  Yes,  pt reports that he is returning home.  At discharge, do you have transportation home?: Yes,  pt reports that his brother will provide transportation. Do you have the ability to pay for your medications: Yes,  MEDICARE / MEDICARE PART A  Release of information consent forms completed and in the chart;  Patient's signature needed at discharge.  Patient to Follow up at:  Follow-up Information     [facility] Follow up.   Why: Please follow up if you choose to see a therapist and/or psychiatrist. Contact information: [address]  [address] [place] [address] [phone]         [facility] Follow up.   Specialty: Professional Counselor Why: Walk in hours are from 9AM to 12PM Monday through Friday. Contact information: [facility] [place] [address] [place] [address] [phone]                 Next level of care provider has access to [facility] Link:no  Safety Planning and Suicide Prevention discussed: Yes,  SPE completed with patient, patient declined collateral contact.      Has patient been referred to the Quitline?: Patient does not use tobacco/nicotine products  Patient has been referred for addiction treatment: Patient refused referral for treatment.  Harden Mo, LCSW 02/15/2023, 10:23 AM

## 2023-11-20 ENCOUNTER — Other Ambulatory Visit: Payer: Self-pay

## 2023-11-20 ENCOUNTER — Emergency Department (HOSPITAL_COMMUNITY)

## 2023-11-20 ENCOUNTER — Other Ambulatory Visit (HOSPITAL_COMMUNITY): Payer: Self-pay

## 2023-11-20 ENCOUNTER — Inpatient Hospital Stay (HOSPITAL_COMMUNITY)

## 2023-11-20 ENCOUNTER — Inpatient Hospital Stay (HOSPITAL_COMMUNITY)
Admission: EM | Admit: 2023-11-20 | Discharge: 2023-12-05 | DRG: 321 | Disposition: A | Discharge status: Home Health | Attending: Cardiology | Admitting: Cardiology

## 2023-11-20 ENCOUNTER — Encounter (HOSPITAL_COMMUNITY): Payer: Self-pay | Admitting: Cardiovascular Disease

## 2023-11-20 ENCOUNTER — Telehealth (HOSPITAL_COMMUNITY): Payer: Self-pay | Admitting: Pharmacy Technician

## 2023-11-20 ENCOUNTER — Encounter (HOSPITAL_COMMUNITY)
Admission: EM | Disposition: A | Discharge status: Home Health | Payer: Self-pay | Source: Home / Self Care | Attending: Cardiovascular Disease

## 2023-11-20 DIAGNOSIS — I509 Heart failure, unspecified: Secondary | ICD-10-CM | POA: Diagnosis not present

## 2023-11-20 DIAGNOSIS — I2111 ST elevation (STEMI) myocardial infarction involving right coronary artery: Secondary | ICD-10-CM | POA: Diagnosis present

## 2023-11-20 DIAGNOSIS — I3139 Other pericardial effusion (noninflammatory): Secondary | ICD-10-CM | POA: Diagnosis not present

## 2023-11-20 DIAGNOSIS — I2510 Atherosclerotic heart disease of native coronary artery without angina pectoris: Secondary | ICD-10-CM | POA: Diagnosis present

## 2023-11-20 DIAGNOSIS — Z8249 Family history of ischemic heart disease and other diseases of the circulatory system: Secondary | ICD-10-CM | POA: Diagnosis not present

## 2023-11-20 DIAGNOSIS — R079 Chest pain, unspecified: Secondary | ICD-10-CM | POA: Diagnosis not present

## 2023-11-20 DIAGNOSIS — I7121 Aneurysm of the ascending aorta, without rupture: Secondary | ICD-10-CM | POA: Diagnosis present

## 2023-11-20 DIAGNOSIS — I472 Ventricular tachycardia, unspecified: Secondary | ICD-10-CM | POA: Diagnosis present

## 2023-11-20 DIAGNOSIS — M109 Gout, unspecified: Secondary | ICD-10-CM | POA: Diagnosis present

## 2023-11-20 DIAGNOSIS — I493 Ventricular premature depolarization: Secondary | ICD-10-CM | POA: Diagnosis present

## 2023-11-20 DIAGNOSIS — Z79899 Other long term (current) drug therapy: Secondary | ICD-10-CM

## 2023-11-20 DIAGNOSIS — I5021 Acute systolic (congestive) heart failure: Secondary | ICD-10-CM | POA: Diagnosis not present

## 2023-11-20 DIAGNOSIS — I462 Cardiac arrest due to underlying cardiac condition: Secondary | ICD-10-CM | POA: Diagnosis present

## 2023-11-20 DIAGNOSIS — Y838 Other surgical procedures as the cause of abnormal reaction of the patient, or of later complication, without mention of misadventure at the time of the procedure: Secondary | ICD-10-CM | POA: Diagnosis not present

## 2023-11-20 DIAGNOSIS — I4901 Ventricular fibrillation: Secondary | ICD-10-CM | POA: Diagnosis present

## 2023-11-20 DIAGNOSIS — E876 Hypokalemia: Secondary | ICD-10-CM | POA: Diagnosis present

## 2023-11-20 DIAGNOSIS — Z6836 Body mass index (BMI) 36.0-36.9, adult: Secondary | ICD-10-CM

## 2023-11-20 DIAGNOSIS — I213 ST elevation (STEMI) myocardial infarction of unspecified site: Principal | ICD-10-CM

## 2023-11-20 DIAGNOSIS — R570 Cardiogenic shock: Secondary | ICD-10-CM | POA: Diagnosis not present

## 2023-11-20 DIAGNOSIS — I130 Hypertensive heart and chronic kidney disease with heart failure and stage 1 through stage 4 chronic kidney disease, or unspecified chronic kidney disease: Secondary | ICD-10-CM | POA: Diagnosis present

## 2023-11-20 DIAGNOSIS — I2109 ST elevation (STEMI) myocardial infarction involving other coronary artery of anterior wall: Secondary | ICD-10-CM | POA: Diagnosis present

## 2023-11-20 DIAGNOSIS — N400 Enlarged prostate without lower urinary tract symptoms: Secondary | ICD-10-CM | POA: Diagnosis present

## 2023-11-20 DIAGNOSIS — E785 Hyperlipidemia, unspecified: Secondary | ICD-10-CM | POA: Diagnosis present

## 2023-11-20 DIAGNOSIS — R509 Fever, unspecified: Secondary | ICD-10-CM | POA: Diagnosis not present

## 2023-11-20 DIAGNOSIS — I228 Subsequent ST elevation (STEMI) myocardial infarction of other sites: Secondary | ICD-10-CM | POA: Diagnosis not present

## 2023-11-20 DIAGNOSIS — Z87891 Personal history of nicotine dependence: Secondary | ICD-10-CM

## 2023-11-20 DIAGNOSIS — Y840 Cardiac catheterization as the cause of abnormal reaction of the patient, or of later complication, without mention of misadventure at the time of the procedure: Secondary | ICD-10-CM | POA: Diagnosis not present

## 2023-11-20 DIAGNOSIS — Z801 Family history of malignant neoplasm of trachea, bronchus and lung: Secondary | ICD-10-CM

## 2023-11-20 DIAGNOSIS — Z955 Presence of coronary angioplasty implant and graft: Secondary | ICD-10-CM

## 2023-11-20 DIAGNOSIS — I5082 Biventricular heart failure: Secondary | ICD-10-CM | POA: Diagnosis present

## 2023-11-20 DIAGNOSIS — N189 Chronic kidney disease, unspecified: Secondary | ICD-10-CM | POA: Diagnosis present

## 2023-11-20 DIAGNOSIS — E669 Obesity, unspecified: Secondary | ICD-10-CM | POA: Diagnosis present

## 2023-11-20 DIAGNOSIS — N179 Acute kidney failure, unspecified: Secondary | ICD-10-CM | POA: Diagnosis not present

## 2023-11-20 DIAGNOSIS — R112 Nausea with vomiting, unspecified: Secondary | ICD-10-CM | POA: Diagnosis present

## 2023-11-20 DIAGNOSIS — I2119 ST elevation (STEMI) myocardial infarction involving other coronary artery of inferior wall: Secondary | ICD-10-CM | POA: Diagnosis present

## 2023-11-20 DIAGNOSIS — D72829 Elevated white blood cell count, unspecified: Secondary | ICD-10-CM | POA: Diagnosis not present

## 2023-11-20 DIAGNOSIS — Z7982 Long term (current) use of aspirin: Secondary | ICD-10-CM

## 2023-11-20 DIAGNOSIS — Z538 Procedure and treatment not carried out for other reasons: Secondary | ICD-10-CM | POA: Diagnosis not present

## 2023-11-20 DIAGNOSIS — G4733 Obstructive sleep apnea (adult) (pediatric): Secondary | ICD-10-CM | POA: Diagnosis present

## 2023-11-20 DIAGNOSIS — I441 Atrioventricular block, second degree: Secondary | ICD-10-CM | POA: Diagnosis present

## 2023-11-20 DIAGNOSIS — F32A Depression, unspecified: Secondary | ICD-10-CM | POA: Diagnosis present

## 2023-11-20 HISTORY — PX: LEFT HEART CATH AND CORONARY ANGIOGRAPHY: CATH118249

## 2023-11-20 HISTORY — PX: CORONARY/GRAFT ACUTE MI REVASCULARIZATION: CATH118305

## 2023-11-20 LAB — BASIC METABOLIC PANEL WITH GFR
Anion gap: 10 (ref 5–15)
BUN: 13 mg/dL (ref 8–23)
CO2: 21 mmol/L — ABNORMAL LOW (ref 22–32)
Calcium: 8.4 mg/dL — ABNORMAL LOW (ref 8.9–10.3)
Chloride: 105 mmol/L (ref 98–111)
Creatinine, Ser: 1.22 mg/dL (ref 0.61–1.24)
GFR, Estimated: >60 mL/min
Glucose, Bld: 144 mg/dL — ABNORMAL HIGH (ref 70–99)
Potassium: 3.6 mmol/L (ref 3.5–5.1)
Sodium: 136 mmol/L (ref 135–145)

## 2023-11-20 LAB — CBC WITH DIFFERENTIAL/PLATELET
Abs Immature Granulocytes: 0.01 10*3/uL (ref 0.00–0.07)
Basophils Absolute: 0 10*3/uL (ref 0.0–0.1)
Basophils Relative: 0 %
Eosinophils Absolute: 0.1 10*3/uL (ref 0.0–0.5)
Eosinophils Relative: 1 %
HCT: 45.2 % (ref 39.0–52.0)
Hemoglobin: 15.1 g/dL (ref 13.0–17.0)
Immature Granulocytes: 0 %
Lymphocytes Relative: 37 %
Lymphs Abs: 2.5 10*3/uL (ref 0.7–4.0)
MCH: 30 pg (ref 26.0–34.0)
MCHC: 33.4 g/dL (ref 30.0–36.0)
MCV: 89.9 fL (ref 80.0–100.0)
Monocytes Absolute: 0.7 10*3/uL (ref 0.1–1.0)
Monocytes Relative: 10 %
Neutro Abs: 3.5 10*3/uL (ref 1.7–7.7)
Neutrophils Relative %: 52 %
Platelets: 205 10*3/uL (ref 150–400)
RBC: 5.03 MIL/uL (ref 4.22–5.81)
RDW: 12.7 % (ref 11.5–15.5)
WBC: 6.8 10*3/uL (ref 4.0–10.5)
nRBC: 0 % (ref 0.0–0.2)

## 2023-11-20 LAB — LIPID PANEL
Cholesterol: 167 mg/dL (ref 0–200)
HDL: 32 mg/dL — ABNORMAL LOW (ref >40)
LDL Cholesterol: 122 mg/dL — ABNORMAL HIGH (ref 0–99)
Total CHOL/HDL Ratio: 5.2 ratio
Triglycerides: 65 mg/dL (ref <150)
VLDL: 13 mg/dL (ref 0–40)

## 2023-11-20 LAB — COMPREHENSIVE METABOLIC PANEL WITH GFR
ALT: 77 U/L — ABNORMAL HIGH (ref 0–44)
AST: 381 U/L — ABNORMAL HIGH (ref 15–41)
Albumin: 3.8 g/dL (ref 3.5–5.0)
Alkaline Phosphatase: 36 U/L — ABNORMAL LOW (ref 38–126)
Anion gap: 13 (ref 5–15)
BUN: 12 mg/dL (ref 8–23)
CO2: 18 mmol/L — ABNORMAL LOW (ref 22–32)
Calcium: 8.8 mg/dL — ABNORMAL LOW (ref 8.9–10.3)
Chloride: 105 mmol/L (ref 98–111)
Creatinine, Ser: 1.24 mg/dL (ref 0.61–1.24)
GFR, Estimated: >60 mL/min (ref >60)
Glucose, Bld: 136 mg/dL — ABNORMAL HIGH (ref 70–99)
Potassium: 3.8 mmol/L (ref 3.5–5.1)
Sodium: 136 mmol/L (ref 135–145)
Total Bilirubin: 1.1 mg/dL (ref 0.0–1.2)
Total Protein: 6.5 g/dL (ref 6.5–8.1)

## 2023-11-20 LAB — POCT I-STAT, CHEM 8
BUN: 12 mg/dL (ref 8–23)
Calcium, Ion: 0.95 mmol/L — ABNORMAL LOW (ref 1.15–1.40)
Chloride: 110 mmol/L (ref 98–111)
Creatinine, Ser: 1.1 mg/dL (ref 0.61–1.24)
Glucose, Bld: 95 mg/dL (ref 70–99)
HCT: 37 % — ABNORMAL LOW (ref 39.0–52.0)
Hemoglobin: 12.6 g/dL — ABNORMAL LOW (ref 13.0–17.0)
Potassium: 2.6 mmol/L — CL (ref 3.5–5.1)
Sodium: 144 mmol/L (ref 135–145)
TCO2: 20 mmol/L — ABNORMAL LOW (ref 22–32)

## 2023-11-20 LAB — POCT ACTIVATED CLOTTING TIME
Activated Clotting Time: 268 s
Activated Clotting Time: 302 s
Activated Clotting Time: 308 s
Activated Clotting Time: 0 s
Activated Clotting Time: 170 s
Activated Clotting Time: 124 s

## 2023-11-20 LAB — PROTIME-INR
INR: 1.2 (ref 0.8–1.2)
Prothrombin Time: 15 s (ref 11.4–15.2)

## 2023-11-20 LAB — HEMOGLOBIN A1C
Hgb A1c MFr Bld: 5.8 % — ABNORMAL HIGH (ref 4.8–5.6)
Mean Plasma Glucose: 120 mg/dL

## 2023-11-20 LAB — TROPONIN I (HIGH SENSITIVITY)
Troponin I (High Sensitivity): 1234 ng/L (ref <18)
Troponin I (High Sensitivity): >24000 ng/L (ref <18)

## 2023-11-20 LAB — CG4 I-STAT (LACTIC ACID): Lactic Acid, Venous: 1.2 mmol/L (ref 0.5–1.9)

## 2023-11-20 LAB — APTT: aPTT: 106 s — ABNORMAL HIGH (ref 24–36)

## 2023-11-20 LAB — MRSA NEXT GEN BY PCR, NASAL: MRSA by PCR Next Gen: NOT DETECTED

## 2023-11-20 MED ORDER — ORAL CARE MOUTH RINSE: 15.0000 mL | OROMUCOSAL | Status: DC | PRN

## 2023-11-20 MED ORDER — TIROFIBAN HCL IN NACL 5-0.9 MG/100ML-% IV SOLN
0.1500 ug/kg/min | INTRAVENOUS | Status: AC
  Administered 2023-11-20 – 2023-11-21 (×3): 0.15 ug/kg/min via INTRAVENOUS
  Filled 2023-11-20 (×3): qty 100

## 2023-11-20 MED ORDER — SODIUM CHLORIDE 0.9% FLUSH: 3.0000 mL | INTRAVENOUS | Status: DC | PRN

## 2023-11-20 MED ORDER — LIDOCAINE HCL (PF) 1 % IJ SOLN
INTRAMUSCULAR | Status: AC
  Filled 2023-11-20: qty 30

## 2023-11-20 MED ORDER — NITROGLYCERIN 1 MG/10 ML FOR IR/CATH LAB
INTRA_ARTERIAL | Status: AC
  Filled 2023-11-20: qty 10

## 2023-11-20 MED ORDER — SODIUM CHLORIDE 0.9 % IV SOLN: INTRAVENOUS | Status: AC

## 2023-11-20 MED ORDER — PRASUGREL HCL 10 MG PO TABS
ORAL_TABLET | ORAL | Status: AC
  Filled 2023-11-20: qty 6

## 2023-11-20 MED ORDER — PRASUGREL HCL 10 MG PO TABS
ORAL_TABLET | ORAL | Status: AC
  Filled 2023-11-20: qty 1

## 2023-11-20 MED ORDER — AMIODARONE HCL IN DEXTROSE 360-4.14 MG/200ML-% IV SOLN
60.0000 mg/h | INTRAVENOUS | Status: DC
  Administered 2023-11-20: 60 mg/h via INTRAVENOUS
  Filled 2023-11-20: qty 200

## 2023-11-20 MED ORDER — SODIUM CHLORIDE 0.9 % IV SOLN: 250.0000 mL | INTRAVENOUS | Status: DC | PRN

## 2023-11-20 MED ORDER — IOHEXOL 350 MG/ML SOLN
INTRAVENOUS | Status: DC | PRN
  Administered 2023-11-20: 130 mL

## 2023-11-20 MED ORDER — ASPIRIN 81 MG PO CHEW
81.0000 mg | CHEWABLE_TABLET | Freq: Every day | ORAL | Status: DC
  Filled 2023-11-20: qty 1

## 2023-11-20 MED ORDER — PRASUGREL HCL 10 MG PO TABS
60.0000 mg | ORAL_TABLET | Freq: Once | ORAL | Status: AC
  Administered 2023-11-20: 60 mg via ORAL
  Filled 2023-11-20: qty 6

## 2023-11-20 MED ORDER — CHLORHEXIDINE GLUCONATE CLOTH 2 % EX PADS
6.0000 | MEDICATED_PAD | Freq: Every day | CUTANEOUS | Status: DC
  Administered 2023-11-20 – 2023-12-05 (×15): 6 via TOPICAL

## 2023-11-20 MED ORDER — LABETALOL HCL 5 MG/ML IV SOLN: 10.0000 mg | INTRAVENOUS | Status: AC | PRN

## 2023-11-20 MED ORDER — AMIODARONE HCL IN DEXTROSE 360-4.14 MG/200ML-% IV SOLN
INTRAVENOUS | Status: AC
  Filled 2023-11-20: qty 200

## 2023-11-20 MED ORDER — SODIUM CHLORIDE 0.9% FLUSH
3.0000 mL | Freq: Two times a day (BID) | INTRAVENOUS | Status: DC
  Administered 2023-11-20 – 2023-11-23 (×6): 3 mL via INTRAVENOUS

## 2023-11-20 MED ORDER — MAGNESIUM HYDROXIDE 400 MG/5ML PO SUSP
30.0000 mL | Freq: Every day | ORAL | Status: DC | PRN
  Administered 2023-11-23 – 2023-12-05 (×2): 30 mL via ORAL
  Filled 2023-11-20 (×3): qty 30

## 2023-11-20 MED ORDER — HEPARIN (PORCINE) IN NACL 1000-0.9 UT/500ML-% IV SOLN
INTRAVENOUS | Status: DC | PRN
  Administered 2023-11-20: 1000 mL

## 2023-11-20 MED ORDER — VERAPAMIL HCL 2.5 MG/ML IV SOLN
INTRAVENOUS | Status: AC
Start: 2023-11-20 — End: ?
  Filled 2023-11-20: qty 2

## 2023-11-20 MED ORDER — ONDANSETRON HCL 4 MG/2ML IJ SOLN
4.0000 mg | Freq: Four times a day (QID) | INTRAMUSCULAR | Status: DC | PRN
  Administered 2023-11-21: 4 mg via INTRAVENOUS
  Filled 2023-11-20: qty 2

## 2023-11-20 MED ORDER — ONDANSETRON HCL 4 MG/2ML IJ SOLN
INTRAMUSCULAR | Status: DC | PRN
Start: 2023-11-20 — End: 2023-11-20
  Administered 2023-11-20: 4 mg via INTRAVENOUS

## 2023-11-20 MED ORDER — FENTANYL CITRATE (PF) 100 MCG/2ML IJ SOLN
INTRAMUSCULAR | Status: AC
  Filled 2023-11-20: qty 2

## 2023-11-20 MED ORDER — ONDANSETRON HCL 4 MG/2ML IJ SOLN
INTRAMUSCULAR | Status: AC
  Filled 2023-11-20: qty 2

## 2023-11-20 MED ORDER — TIROFIBAN (AGGRASTAT) BOLUS VIA INFUSION: 25.0000 ug/kg | Freq: Once | INTRAVENOUS | Status: DC

## 2023-11-20 MED ORDER — POTASSIUM CHLORIDE CRYS ER 20 MEQ PO TBCR
80.0000 meq | EXTENDED_RELEASE_TABLET | Freq: Every day | ORAL | Status: DC
  Administered 2023-11-20 – 2023-11-21 (×2): 80 meq via ORAL
  Filled 2023-11-20 (×2): qty 4

## 2023-11-20 MED ORDER — OXYCODONE HCL 5 MG PO TABS
5.0000 mg | ORAL_TABLET | ORAL | Status: DC | PRN
  Administered 2023-11-21 (×2): 5 mg via ORAL
  Filled 2023-11-20 (×2): qty 1

## 2023-11-20 MED ORDER — TIROFIBAN HCL IN NACL 5-0.9 MG/100ML-% IV SOLN
INTRAVENOUS | Status: DC | PRN
  Administered 2023-11-20 (×2): .15 ug/kg/min via INTRAVENOUS

## 2023-11-20 MED ORDER — ROSUVASTATIN CALCIUM 20 MG PO TABS
40.0000 mg | ORAL_TABLET | Freq: Every day | ORAL | Status: DC
  Administered 2023-11-20 – 2023-12-05 (×16): 40 mg via ORAL
  Filled 2023-11-20 (×13): qty 2
  Filled 2023-11-20: qty 1
  Filled 2023-11-20 (×3): qty 2

## 2023-11-20 MED ORDER — HEPARIN SODIUM (PORCINE) 1000 UNIT/ML IJ SOLN
INTRAMUSCULAR | Status: AC
Start: 2023-11-20 — End: ?
  Filled 2023-11-20: qty 10

## 2023-11-20 MED ORDER — ACETAMINOPHEN 325 MG PO TABS: 650.0000 mg | ORAL_TABLET | ORAL | Status: DC | PRN

## 2023-11-20 MED ORDER — HEPARIN SODIUM (PORCINE) 1000 UNIT/ML IJ SOLN
INTRAMUSCULAR | Status: DC | PRN
  Administered 2023-11-20: 8000 [IU] via INTRAVENOUS
  Administered 2023-11-20: 3000 [IU] via INTRAVENOUS

## 2023-11-20 MED ORDER — AMIODARONE LOAD VIA INFUSION
INTRAVENOUS | Status: DC | PRN
  Administered 2023-11-20: 150 mg via INTRAVENOUS
  Administered 2023-11-20: 60 mg via INTRAVENOUS

## 2023-11-20 MED ORDER — MIDAZOLAM HCL 2 MG/2ML IJ SOLN
INTRAMUSCULAR | Status: AC
  Filled 2023-11-20: qty 2

## 2023-11-20 MED ORDER — AMIODARONE LOAD VIA INFUSION
150.0000 mg | Freq: Once | INTRAVENOUS | Status: DC
  Filled 2023-11-20: qty 83.34

## 2023-11-20 MED ORDER — PRASUGREL HCL 10 MG PO TABS
ORAL_TABLET | ORAL | Status: DC | PRN
  Administered 2023-11-20: 60 mg via ORAL

## 2023-11-20 MED ORDER — HEPARIN (PORCINE) IN NACL 2000-0.9 UNIT/L-% IV SOLN
INTRAVENOUS | Status: DC | PRN
  Administered 2023-11-20: 1000 mL

## 2023-11-20 MED ORDER — LIDOCAINE HCL (PF) 1 % IJ SOLN
INTRAMUSCULAR | Status: DC | PRN
  Administered 2023-11-20 (×2): 10 mL

## 2023-11-20 MED ORDER — HYDRALAZINE HCL 20 MG/ML IJ SOLN: 10.0000 mg | INTRAMUSCULAR | Status: AC | PRN

## 2023-11-20 MED ORDER — TIROFIBAN HCL IN NACL 5-0.9 MG/100ML-% IV SOLN
INTRAVENOUS | Status: AC
  Filled 2023-11-20: qty 100

## 2023-11-20 MED ORDER — MIDAZOLAM HCL 2 MG/2ML IJ SOLN
INTRAMUSCULAR | Status: DC | PRN
  Administered 2023-11-20: 1 mg via INTRAVENOUS

## 2023-11-20 MED ORDER — HEPARIN SODIUM (PORCINE) 5000 UNIT/ML IJ SOLN
4000.0000 [IU] | Freq: Once | INTRAMUSCULAR | Status: AC
  Administered 2023-11-20: 4000 [IU] via INTRAVENOUS

## 2023-11-20 MED ORDER — PRASUGREL HCL 10 MG PO TABS
10.0000 mg | ORAL_TABLET | Freq: Every day | ORAL | Status: DC
  Administered 2023-11-21 – 2023-12-05 (×15): 10 mg via ORAL
  Filled 2023-11-20 (×15): qty 1

## 2023-11-20 MED ORDER — TIROFIBAN (AGGRASTAT) BOLUS VIA INFUSION
INTRAVENOUS | Status: DC | PRN
  Administered 2023-11-20: 3050 ug via INTRAVENOUS

## 2023-11-20 MED ORDER — AMIODARONE HCL IN DEXTROSE 360-4.14 MG/200ML-% IV SOLN
30.0000 mg/h | INTRAVENOUS | Status: DC
  Administered 2023-11-21: 30 mg/h via INTRAVENOUS
  Filled 2023-11-20: qty 200

## 2023-11-20 MED ORDER — MORPHINE SULFATE (PF) 2 MG/ML IV SOLN
2.0000 mg | INTRAVENOUS | Status: DC | PRN
  Administered 2023-11-21 – 2023-11-23 (×10): 2 mg via INTRAVENOUS
  Filled 2023-11-20 (×11): qty 1

## 2023-11-20 MED ORDER — FENTANYL CITRATE (PF) 100 MCG/2ML IJ SOLN
INTRAMUSCULAR | Status: DC | PRN
  Administered 2023-11-20: 50 ug via INTRAVENOUS

## 2023-11-20 NOTE — Progress Notes (Signed)
 Pt arrived to cath lab holding, [facility] [address], report received, upon opening EPIC, 2H bed assigned and ready, cath lab to give report and holding RN to transfer pt, monitor in place, IV Amiodarone and IV aggrastat infusing into lfa PIV, denies pain, safety maintained

## 2023-11-20 NOTE — ED Provider Notes (Signed)
  EMERGENCY DEPARTMENT AT [facility] Provider Note   CSN: [id number] Arrival date & time: 11/20/23  1241     History  Chief Complaint  Patient presents with   Code STEMI    Mansa Stamatis is a 68 y.o. male.  With a past medical history of hypertension hyperlipidemia.  Patient first experienced chest pain while sitting at rest while at work today.  Associated nausea vomiting.  Pain localized over substernal region without radiation to other areas.  EMS gave 324 aspirin prior to arrival.  His initial blood pressure was 90 systolic so nitro was not given.  Received 500 cc IV fluid bolus with EMS prior to arrival as well.  In the ED continues to have ongoing chest pain which is improved slightly since the onset.  Interventional cardiology team at bedside  HPI     Home Medications Prior to Admission medications   Medication Sig Start Date End Date Taking? Authorizing Provider  allopurinol  (ZYLOPRIM ) 300 MG tablet Take 0.5 tablets (150 mg total) by mouth daily. 02/16/23   Silas Drivers, MD  amLODipine  (NORVASC ) 5 MG tablet Take 5 mg by mouth daily. 09/27/23   [provider]  finasteride  (PROSCAR ) 5 MG tablet Take 1 tablet (5 mg total) by mouth daily. 02/16/23   Silas Drivers, MD  Multiple Vitamin (MULTIVITAMIN WITH MINERALS) TABS tablet Take 1 tablet by mouth daily. 02/16/23   Silas Drivers, MD  rosuvastatin  (CRESTOR ) 5 MG tablet Take 1 tablet (5 mg total) by mouth daily. 02/16/23   Silas Drivers, MD  sertraline  (ZOLOFT ) 50 MG tablet Take 1 tablet (50 mg total) by mouth daily. 02/16/23   Silas Drivers, MD  traZODone  (DESYREL ) 50 MG tablet Take 1 tablet (50 mg total) by mouth at bedtime as needed for sleep. 02/15/23   Silas Drivers, MD      Allergies    Patient has no known allergies.    Review of Systems   Review of Systems  Physical Exam Updated Vital Signs BP (!) 121/92   Pulse 65   Resp (!) 25   Ht 5\' 11"  (1.803 m)   Wt 122 kg   SpO2  98%   BMI 37.51 kg/m  Physical Exam Vitals and nursing note reviewed.  HENT:     Head: Normocephalic and atraumatic.  Eyes:     Pupils: Pupils are equal, round, and reactive to light.  Cardiovascular:     Rate and Rhythm: Normal rate and regular rhythm.  Pulmonary:     Effort: Pulmonary effort is normal.     Breath sounds: Normal breath sounds.  Abdominal:     Palpations: Abdomen is soft.     Tenderness: There is no abdominal tenderness.  Skin:    General: Skin is warm and dry.  Neurological:     Mental Status: He is alert.  Psychiatric:        Mood and Affect: Mood normal.     ED Results / Procedures / Treatments   Labs (all labs ordered are listed, but only abnormal results are displayed) Labs Reviewed  APTT - Abnormal; Notable for the following components:      Result Value   aPTT 106 (*)    All other components within normal limits  LIPID PANEL - Abnormal; Notable for the following components:   HDL 32 (*)    LDL Cholesterol 122 (*)    All other components within normal limits  POCT I-STAT, CHEM 8 - Abnormal; Notable for the following  components:   Potassium 2.6 (*)    Calcium , Ion 0.95 (*)    TCO2 20 (*)    Hemoglobin 12.6 (*)    HCT 37.0 (*)    All other components within normal limits  TROPONIN I (HIGH SENSITIVITY) - Abnormal; Notable for the following components:   Troponin I (High Sensitivity) 1,234 (*)    All other components within normal limits  MRSA NEXT GEN BY PCR, NASAL  CBC WITH DIFFERENTIAL/PLATELET  PROTIME-INR  HEMOGLOBIN A1C  COMPREHENSIVE METABOLIC PANEL WITH GFR  BASIC METABOLIC PANEL WITH GFR  BASIC METABOLIC PANEL WITH GFR  CBC  LIPOPROTEIN A (LPA)  CG4 I-STAT (LACTIC ACID)  POCT ACTIVATED CLOTTING TIME  POCT ACTIVATED CLOTTING TIME  POCT ACTIVATED CLOTTING TIME  POCT ACTIVATED CLOTTING TIME  POCT ACTIVATED CLOTTING TIME  TROPONIN I (HIGH SENSITIVITY)    EKG EKG Interpretation Date/Time:  Tuesday Nov 20 2023 12:49:28  EDT Ventricular Rate:  77 PR Interval:  260 QRS Duration:  107 QT Interval:  376 QTC Calculation: 426 R Axis:   76  Text Interpretation: Sinus rhythm Prolonged PR interval Inferior infarct, acute (RCA) Anterior infarct, acute Lateral leads are also involved Probable RV involvement, suggest recording right precordial leads >>> Acute MI <<< Confirmed by Rafael Bun [phone]) on 11/20/2023 5:39:21 PM  Radiology DG Chest Port 1 View Result Date: 11/20/2023 CLINICAL DATA:  cp stemi EXAM: PORTABLE CHEST 1 VIEW COMPARISON:  05/25/2022. FINDINGS: Low lung volume. Bilateral lung fields are clear. Bilateral costophrenic angles are clear. Stable cardio-mediastinal silhouette. No acute osseous abnormalities. The soft tissues are within normal limits. IMPRESSION: No active disease. Electronically Signed   By: Beula Brunswick M.D.   On: 11/20/2023 16:33   CARDIAC CATHETERIZATION Result Date: 11/20/2023   Mid RCA to Dist RCA lesion is 100% stenosed.   Prox Cx lesion is 20% stenosed.   Mid Cx to Dist Cx lesion is 20% stenosed.   Mid LAD lesion is 20% stenosed.   Balloon angioplasty was performed using a BALLOON EMERGE MR 2.5X15.   Post intervention, there is a 70% residual stenosis. Acute inferior STEMI secondary to thrombotic occlusion of the very large caliber RCA in the distal segment. Balloon angioplasty and aspiration thrombectomy of the distal RCA. Due to large vessel size and thrombus burden, no stent was placed today. Minimal plaque in the LAD and Circumflex LVEDP 27 mm Hg Recommendations: Will admit to the ICU. Aggrastat drip for 18 hours. Will continue ASA and Effient for at least 12 months. High intensity statin. Amiodarone infusion for now given VF arrest in the cath lab. Echo tomorrow. Will plan re-look cath in 24-48 hours.    Procedures Procedures    Medications Ordered in ED Medications  0.9 %  sodium chloride  infusion ( Intravenous Not Given 11/20/23 1636)  amiodarone (NEXTERONE) 1.8 mg/mL  load via infusion 150 mg (has no administration in time range)    Followed by  amiodarone (NEXTERONE PREMIX) 360-4.14 MG/200ML-% (1.8 mg/mL) IV infusion (60 mg/hr Intravenous New Bag/Given 11/20/23 1634)    Followed by  amiodarone (NEXTERONE PREMIX) 360-4.14 MG/200ML-% (1.8 mg/mL) IV infusion (has no administration in time range)  tirofiban (AGGRASTAT) infusion 50 mcg/mL 100 mL (0.15 mcg/kg/min  122 kg Intravenous IV Pump Association 11/20/23 1600)  nitroGLYCERIN 100 mcg/mL intra-arterial injection (has no administration in time range)  rosuvastatin  (CRESTOR ) tablet 40 mg (40 mg Oral Given 11/20/23 1710)  labetalol (NORMODYNE) injection 10 mg (has no administration in time range)  hydrALAZINE  (APRESOLINE ) injection 10  mg (has no administration in time range)  acetaminophen  (TYLENOL ) tablet 650 mg (has no administration in time range)  ondansetron  (ZOFRAN ) injection 4 mg (has no administration in time range)  0.9 %  sodium chloride  infusion ( Intravenous IV Pump Association 11/20/23 1600)  sodium chloride  flush (NS) 0.9 % injection 3 mL (has no administration in time range)  sodium chloride  flush (NS) 0.9 % injection 3 mL (has no administration in time range)  0.9 %  sodium chloride  infusion (has no administration in time range)  oxyCODONE  (Oxy IR/ROXICODONE ) immediate release tablet 5-10 mg (has no administration in time range)  morphine  (PF) 2 MG/ML injection 2 mg (has no administration in time range)  aspirin chewable tablet 81 mg (has no administration in time range)  prasugrel (EFFIENT) tablet 10 mg (has no administration in time range)  Chlorhexidine Gluconate Cloth 2 % PADS 6 each (6 each Topical Given 11/20/23 1634)  Oral care mouth rinse (has no administration in time range)  potassium chloride  SA (KLOR-CON  M) CR tablet 80 mEq (80 mEq Oral Given 11/20/23 1718)  heparin injection 4,000 Units ( Intravenous MAR Unhold 11/20/23 1551)  prasugrel (EFFIENT) tablet 60 mg (60 mg Oral Given 11/20/23  1627)    ED Course/ Medical Decision Making/ A&P Clinical Course as of 11/20/23 1742  Tue Nov 20, 2023  1252 Dr. Antoinette Batman (interventional cardiology) at bedside.  Patient will be taken directly to Cath Lab for cardiac catheterization given ongoing chest pain with EKG findings concerning for inferior MI [MP]    Clinical Course User Index [MP] Sallyanne Creamer, DO                                 Medical Decision Making 68 year old male with history as above presenting for acute chest pain.  EMS EKG concerning for inferior STEMI.  Code STEMI activated patient examined with interventional cardiology team at bedside [facility]).  He is awake alert hemodynamically stable.  Our repeat twelve-lead EKG here does redemonstrate inferior STEMI.  For this reason we loaded with heparin.  He already received aspirin prior to arrival.  Will plan for transport directly to Cath Lab  Amount and/or Complexity of Data Reviewed Labs: ordered. Radiology: ordered.  Risk Prescription drug management.           Final Clinical Impression(s) / ED Diagnoses Final diagnoses:  Acute ST elevation myocardial infarction (STEMI), unspecified artery [facility])    Rx / DC Orders ED Discharge Orders     None         Sallyanne Creamer, DO 11/20/23 1742

## 2023-11-20 NOTE — Discharge Instructions (Signed)
 Information about your medication: Effient (anti-platelet agent)  Generic Name (Brand): prasugrel (Effient), once daily medication  PURPOSE: You are taking this medication along with aspirin to lower your chance of having a heart attack, stroke, or blood clots in your heart stent. These can be fatal. Effient and aspirin help prevent platelets from sticking together and forming a clot that can block an artery or your stent.   Common SIDE EFFECTS you may experience include: bruising or bleeding more easily, shortness of breath  Do not stop taking EFFIENT without talking to the doctor who prescribes it for you. People who are treated with a stent and stop taking Effient too soon, have a higher risk of getting a blood clot in the stent, having a heart attack, or dying. If you stop Effient because of bleeding, or for other reasons, your risk of a heart attack or stroke may increase.   Avoid taking NSAID agents or anti-inflammatory medications such as ibuprofen, naproxen given increased bleed risk with Effient - can use acetaminophen (Tylenol) if needed for pain.  Tell all of your doctors and dentists that you are taking Effient. They should talk to the doctor who prescribed Effient for you before you have any surgery or invasive procedure.   Contact your health care provider if you experience: severe or uncontrollable bleeding, pink/red/Murry urine, vomiting blood or vomit that looks like "coffee grounds", red or black stools (looks like tar), coughing up blood or blood clots ----------------------------------------------------------------------------------------------------------------------

## 2023-11-20 NOTE — ED Notes (Signed)
 The daughter called to check on her father. She is on the way to the ED to answer any questions the nurse may have about her father.  Daughter-Lynsi([phone])

## 2023-11-20 NOTE — Telephone Encounter (Signed)
 Patient Product/process development scientist completed.    The patient is insured through [facility]. Patient has Medicare and is not eligible for a copay card, but may be able to apply for patient assistance or Medicare RX Payment Plan (Patient Must reach out to their plan, if eligible for payment plan), if available.    Ran test claim for Brilinta 90 mg and the current 30 day co-pay is $302.00 due to a $255.00 deductible.  Will be $47.00 once deductible is met.  Ran test claim for prasugel 10 mg and the current 30 day co-pay is $36.22   This test claim was processed through [employer]- copay amounts may vary at other pharmacies due to [employer], or as the patient moves through the different stages of their insurance plan.     Morgan Arab, CPHT Pharmacy Technician III Certified Patient Advocate [facility] Patient Advocate Team Direct Number: [phone]  Fax: [phone]

## 2023-11-20 NOTE — H&P (Addendum)
 Cardiology Admission History and Physical   Patient ID: Ricky Bailey MRN: [id number]; DOB: [birth year]   Admission date: 11/20/2023  PCP:  Lonni Robert, MD   [facility] Providers Cardiologist:      Chief Complaint:  Chest pain   Patient Profile:   Ricky Bailey is a 68 y.o. male with depression, HTN, HLD, obesity, BPH,  OSA, who is being seen 11/20/2023 for the evaluation of STEMI.  History of Present Illness:   Ricky Bailey with above PMH presented to [facility] ER as code STEMI evaluation. He states he started having mid-sternum chest pain 8-9/10 around noon. He was sitting in the truck at the time, did not do anything exertion prior. He states he had some nausea with emesis. He denied any SOB, dizziness, syncope, or paresthesia. He denied any hx of CAD/MI, CHF, DM, CVA. He denied any recent surgery or hx of major bleeding. He denied any known allergy. EMS reports he was hypotensive with BP 90/50 when they arrived. EKG in the field initially was concerning for inferolateral STEMI. He was given ASA 325mg  and saline. BP improved to 110 systolic upon arrival to ER. He states pain had improved to 5/10 upon arrival to ER. He denied any tobacco use.   At ED, he was hemodynamically stable. He was given heparin bolus.Cardiology team at bedside, emergent cardiac cath is recommended for further evaluation. He is agreeable. He reports his POA is his daughter Haskell Linker. He wishes full code.       Past Medical History:  Diagnosis Date   Back pain    BPH (benign prostatic hyperplasia)    Gout    Hyperlipidemia    Hypertension    Obesity    Sleep apnea    Vertigo 03/28/2018   with vommiting    Past Surgical History:  Procedure Laterality Date   BACK SURGERY       Medications Prior to Admission: Prior to Admission medications   Medication Sig Start Date End Date Taking? Authorizing Provider  allopurinol  (ZYLOPRIM ) 300 MG tablet Take 0.5 tablets (150 mg total) by mouth  daily. 02/16/23   Silas Drivers, MD  finasteride  (PROSCAR ) 5 MG tablet Take 1 tablet (5 mg total) by mouth daily. 02/16/23   Silas Drivers, MD  Multiple Vitamin (MULTIVITAMIN WITH MINERALS) TABS tablet Take 1 tablet by mouth daily. 02/16/23   Silas Drivers, MD  rosuvastatin  (CRESTOR ) 5 MG tablet Take 1 tablet (5 mg total) by mouth daily. 02/16/23   Silas Drivers, MD  sertraline  (ZOLOFT ) 50 MG tablet Take 1 tablet (50 mg total) by mouth daily. 02/16/23   Silas Drivers, MD  traZODone  (DESYREL ) 50 MG tablet Take 1 tablet (50 mg total) by mouth at bedtime as needed for sleep. 02/15/23   Silas Drivers, MD     Allergies:   No Known Allergies  Social History:   Social History   Socioeconomic History   Marital status: Married    Spouse name: Not on file   Number of children: Not on file   Years of education: Not on file   Highest education level: Not on file  Occupational History   Not on file  Tobacco Use   Smoking status: Former    Current packs/day: 0.00    Types: Cigarettes    Quit date: 07/21/1977    Years since quitting: 46.3   Smokeless tobacco: Never  Vaping Use   Vaping status: Never Used  Substance and Sexual Activity   Alcohol use: Yes  Alcohol/week: 6.0 standard drinks of alcohol    Types: 6 Cans of beer per week   Drug use: No   Sexual activity: Not on file  Other Topics Concern   Not on file  Social History Narrative   Not on file   Social Drivers of Health   Financial Resource Strain: Not on file  Food Insecurity: No Food Insecurity (02/09/2023)   Hunger Vital Sign    Worried About Running Out of Food in the Last Year: Never true    Ran Out of Food in the Last Year: Never true  Transportation Needs: No Transportation Needs (02/09/2023)   PRAPARE - Administrator, Civil Service (Medical): No    Lack of Transportation (Non-Medical): No  Physical Activity: Not on file  Stress: Not on file  Social Connections: Unknown (11/20/2021)    Received from [facility]   Social Network    Social Network: Not on file  Intimate Partner Violence: Not At Risk (02/09/2023)   Humiliation, Afraid, Rape, and Kick questionnaire    Fear of Current or Ex-Partner: No    Emotionally Abused: No    Physically Abused: No    Sexually Abused: No    Family History:   The patient's family history includes Heart disease in his mother; Lung cancer in his father. There is no history of Colon polyps, Colon cancer, Esophageal cancer, Rectal cancer, or Stomach cancer.    ROS:  Constitutional: Denied fever, chills, malaise, night sweats Eyes: Denied vision change or loss Ears/Nose/Mouth/Throat: Denied ear ache, sore throat, coughing, sinus pain Cardiovascular: see HPI  Respiratory: see HPI  Gastrointestinal: see HPI  Genital/Urinary: Denied dysuria, hematuria, urinary frequency/urgency Musculoskeletal: Denied muscle ache, joint pain, weakness Skin: Denied rash, wound Neuro: Denied headache, dizziness, syncope Psych: Denied history of depression/anxiety  Endocrine: Denied history of diabetes   Physical Exam/Data:  There were no vitals filed for this visit. No intake or output data in the 24 hours ending 11/20/23 1321    02/09/2023    4:15 AM 10/16/2022    7:38 AM 09/25/2022    9:08 AM  Last 3 Weights  Weight (lbs) 264 lb 255 lb 255 lb  Weight (kg) 119.75 kg 115.667 kg 115.667 kg     There is no height or weight on file to calculate BMI.   Vitals: There were no vitals filed for this visit.  General Appearance: In no apparent distress, laying in bed, obese  HEENT: Normocephalic, atraumatic.  Neck: Supple, trachea midline, no JVDs Cardiovascular: Regular rate and rhythm, normal S1-S2,  no murmur  Respiratory: Resting breathing unlabored, lungs sounds clear to auscultation bilaterally, no use of accessory muscles. On room air.  No wheezes, rales or rhonchi.   Gastrointestinal: Bowel sounds positive, abdomen soft, non-tender,  obese  Extremities: Able to move all extremities in bed without difficulty, no edema of BLE. Radial pulse 2+ right  Musculoskeletal: Normal muscle bulk and tone Skin: Intact, warm, dry. No rashes  Neurologic: Alert, oriented to person, place and time. Fluent speech, no cognitive deficit,  no gross focal neuro deficit Psychiatric: Flat effect    EKG:   EKG 11/20/23 12:22 sinus rhythm 64 bpm with first degree AVB, inferior ST elevation and lateral ST upsloping   EKG from 11/20/23 12:31 sinus rhythm 72bpm with first degree AVB, worsening inferolateral ST elevation   EKG 11/20/23 at 1249 sinus rhythm 77bpm with first degree AVB, inferolateral STEMI  Relevant CV Studies:  N/A previously  Laboratory Data:  High Sensitivity Troponin:  No results for input(s): "TROPONINIHS" in the last 720 hours.    ChemistryNo results for input(s): "NA", "K", "CL", "CO2", "GLUCOSE", "BUN", "CREATININE", "CALCIUM ", "MG", "GFRNONAA", "GFRAA", "ANIONGAP" in the last 168 hours.  No results for input(s): "PROT", "ALBUMIN", "AST", "ALT", "ALKPHOS", "BILITOT" in the last 168 hours. Lipids No results for input(s): "CHOL", "TRIG", "HDL", "LABVLDL", "LDLCALC", "CHOLHDL" in the last 168 hours. HematologyNo results for input(s): "WBC", "RBC", "HGB", "HCT", "MCV", "MCH", "MCHC", "RDW", "PLT" in the last 168 hours. Thyroid  No results for input(s): "TSH", "FREET4" in the last 168 hours. BNPNo results for input(s): "BNP", "PROBNP" in the last 168 hours.  DDimer No results for input(s): "DDIMER" in the last 168 hours.   Radiology/Studies:  No results found.   Assessment and Plan:   STEMI - emergent cardiac cath recommended, risk versus benefit discussed, patient is agreeable  - NPO - IV heparin gtt started at ED - admit to telemetry unit - will check CBC, CMP, Mag, INR, A1C, lipid panel, Echo  - medical therapy: start ASA 81mg , crestor20mg , metoprolol XL 25mg  daily ; further recommendation pending cath finding    HTN - BP low normal, hold PTA amlodipine , avoid nitroglycerin given possible RV infarct and hypotension   HLD - check lipid, on crestor  5mg , will increase to 20mg  daily   Gout prophylaxis - resume PTA allopurinol  300mg  daily   BPH - resume PTA finasteride  5mg  daily   Depression - resume PTA zoloft  50mg  daily   DVT prophylaxis - on heparin gtt   AD: Full code Dispo: pending clinical course   Informed Consent   Shared Decision Making/Informed Consent{  The risks [stroke (1 in 1000), death (1 in 1000), kidney failure [usually temporary] (1 in 500), bleeding (1 in 200), allergic reaction [possibly serious] (1 in 200)], benefits (diagnostic support and management of coronary artery disease) and alternatives of a cardiac catheterization were discussed in detail with Mr. Yax and he is willing to proceed.       Risk Assessment/Risk Scores:   TIMI Risk Score for ST  Elevation MI:   The patient's TIMI risk score is 6, which indicates a 16.1% risk of all cause mortality at 30 days.{    Code Status: Full Code  Severity of Illness: The appropriate patient status for this patient is INPATIENT. Inpatient status is judged to be reasonable and necessary in order to provide the required intensity of service to ensure the patient's safety. The patient's presenting symptoms, physical exam findings, and initial radiographic and laboratory data in the context of their chronic comorbidities is felt to place them at high risk for further clinical deterioration. Furthermore, it is not anticipated that the patient will be medically stable for discharge from the hospital within 2 midnights of admission.   * I certify that at the point of admission it is my clinical judgment that the patient will require inpatient hospital care spanning beyond 2 midnights from the point of admission due to high intensity of service, high risk for further deterioration and high frequency of surveillance  required.*   For questions or updates, please contact [facility] Please consult [email or web address] for contact info under     Signed, Xika Zhao, NP  11/20/2023 1:21 PM    I have personally seen and examined this patient. I agree with the assessment and plan as outlined above.  68 yo male with history of HTN, HLD admitted with inferior stemi.  Pt had onset of chest  pain 1 hour prior to arrival in the ED. Code STEMI activated by EMS.  8/10 chest pain while in the ED. EKG reviewed by me with sinus rhythm with 4 mm Inferior ST elevation.  My exam: appears uncomfortable. CV:RRR Lung: clear bilaterally Plan: Emergent cardiac cath with probable PCI.   Antoinette Batman 11/20/2023 2:57 PM

## 2023-11-20 NOTE — Plan of Care (Signed)
  Problem: Clinical Measurements: Goal: Will remain free from infection Outcome: Progressing   Problem: Elimination: Goal: Will not experience complications related to urinary retention Outcome: Progressing   Problem: Pain Managment: Goal: General experience of comfort will improve and/or be controlled Outcome: Progressing   Problem: Safety: Goal: Ability to remain free from injury will improve Outcome: Progressing   Problem: Skin Integrity: Goal: Risk for impaired skin integrity will decrease Outcome: Progressing

## 2023-11-20 NOTE — ED Triage Notes (Signed)
 Pt to ED via EMS at STEMI. CP that started about an hour ago when at rest. Central chest-non-radiating. Initiall BP 93/53. Given 500ml NS BP came to 163/74. HR 70 99% RR 17 A&O x 4. Vomited x 1.   20g L forearm 324mg  asa  No h/o CVA or STEMI

## 2023-11-21 ENCOUNTER — Inpatient Hospital Stay (HOSPITAL_COMMUNITY)

## 2023-11-21 ENCOUNTER — Encounter (HOSPITAL_COMMUNITY)
Admission: EM | Disposition: A | Discharge status: Home Health | Payer: Self-pay | Source: Home / Self Care | Attending: Cardiovascular Disease

## 2023-11-21 ENCOUNTER — Encounter (HOSPITAL_COMMUNITY): Payer: Self-pay | Admitting: Cardiology

## 2023-11-21 DIAGNOSIS — I2510 Atherosclerotic heart disease of native coronary artery without angina pectoris: Secondary | ICD-10-CM | POA: Diagnosis not present

## 2023-11-21 DIAGNOSIS — E785 Hyperlipidemia, unspecified: Secondary | ICD-10-CM

## 2023-11-21 DIAGNOSIS — I2119 ST elevation (STEMI) myocardial infarction involving other coronary artery of inferior wall: Secondary | ICD-10-CM

## 2023-11-21 DIAGNOSIS — I2111 ST elevation (STEMI) myocardial infarction involving right coronary artery: Secondary | ICD-10-CM | POA: Diagnosis not present

## 2023-11-21 HISTORY — PX: CORONARY/GRAFT ACUTE MI REVASCULARIZATION: CATH118305

## 2023-11-21 HISTORY — PX: LEFT HEART CATH AND CORONARY ANGIOGRAPHY: CATH118249

## 2023-11-21 HISTORY — PX: CORONARY THROMBECTOMY: CATH118304

## 2023-11-21 LAB — CBC
HCT: 45.1 % (ref 39.0–52.0)
Hemoglobin: 15.3 g/dL (ref 13.0–17.0)
MCH: 30.1 pg (ref 26.0–34.0)
MCHC: 33.9 g/dL (ref 30.0–36.0)
MCV: 88.8 fL (ref 80.0–100.0)
Platelets: 220 10*3/uL (ref 150–400)
RBC: 5.08 MIL/uL (ref 4.22–5.81)
RDW: 12.7 % (ref 11.5–15.5)
WBC: 8.7 10*3/uL (ref 4.0–10.5)
nRBC: 0 % (ref 0.0–0.2)

## 2023-11-21 LAB — POCT ACTIVATED CLOTTING TIME
Activated Clotting Time: 371 s
Activated Clotting Time: 308 s
Activated Clotting Time: 308 s
Activated Clotting Time: 250 s
Activated Clotting Time: 268 s
Activated Clotting Time: 170 s
Activated Clotting Time: 124 s

## 2023-11-21 LAB — BASIC METABOLIC PANEL WITH GFR
Anion gap: 10 (ref 5–15)
BUN: 14 mg/dL (ref 8–23)
CO2: 20 mmol/L — ABNORMAL LOW (ref 22–32)
Calcium: 8.8 mg/dL — ABNORMAL LOW (ref 8.9–10.3)
Chloride: 106 mmol/L (ref 98–111)
Creatinine, Ser: 1.05 mg/dL (ref 0.61–1.24)
GFR, Estimated: >60 mL/min (ref >60)
Glucose, Bld: 135 mg/dL — ABNORMAL HIGH (ref 70–99)
Potassium: 3.7 mmol/L (ref 3.5–5.1)
Sodium: 136 mmol/L (ref 135–145)

## 2023-11-21 LAB — CG4 I-STAT (LACTIC ACID): Lactic Acid, Venous: 0.6 mmol/L (ref 0.5–1.9)

## 2023-11-21 SURGERY — LEFT HEART CATH AND CORONARY ANGIOGRAPHY
Anesthesia: LOCAL

## 2023-11-21 MED ORDER — HEPARIN SODIUM (PORCINE) 5000 UNIT/ML IJ SOLN
5000.0000 [IU] | Freq: Three times a day (TID) | INTRAMUSCULAR | Status: DC
  Administered 2023-11-22 – 2023-12-05 (×40): 5000 [IU] via SUBCUTANEOUS
  Filled 2023-11-21 (×39): qty 1

## 2023-11-21 MED ORDER — HEPARIN SODIUM (PORCINE) 1000 UNIT/ML IJ SOLN
INTRAMUSCULAR | Status: AC
  Filled 2023-11-21: qty 10

## 2023-11-21 MED ORDER — MORPHINE SULFATE (PF) 2 MG/ML IV SOLN
2.0000 mg | Freq: Once | INTRAVENOUS | Status: AC
  Administered 2023-11-21: 2 mg via INTRAVENOUS

## 2023-11-21 MED ORDER — ADENOSINE 6 MG/2ML IV SOLN: INTRAVENOUS | Status: DC | PRN

## 2023-11-21 MED ORDER — AMIODARONE HCL 200 MG PO TABS
400.0000 mg | ORAL_TABLET | Freq: Two times a day (BID) | ORAL | Status: DC
  Administered 2023-11-21 – 2023-11-22 (×4): 400 mg via ORAL
  Filled 2023-11-21 (×4): qty 2

## 2023-11-21 MED ORDER — ACETAMINOPHEN 325 MG PO TABS
650.0000 mg | ORAL_TABLET | ORAL | Status: DC | PRN
  Administered 2023-11-21 – 2023-11-23 (×2): 650 mg via ORAL
  Filled 2023-11-21 (×2): qty 2

## 2023-11-21 MED ORDER — SODIUM CHLORIDE 0.9 % IV SOLN
INTRAVENOUS | Status: AC | PRN
  Administered 2023-11-21: 250 mL via INTRAVENOUS

## 2023-11-21 MED ORDER — ONDANSETRON HCL 4 MG/2ML IJ SOLN
4.0000 mg | Freq: Four times a day (QID) | INTRAMUSCULAR | Status: DC | PRN
  Administered 2023-11-21: 4 mg via INTRAVENOUS
  Filled 2023-11-21: qty 2

## 2023-11-21 MED ORDER — FENTANYL CITRATE (PF) 100 MCG/2ML IJ SOLN
INTRAMUSCULAR | Status: AC
  Filled 2023-11-21: qty 2

## 2023-11-21 MED ORDER — TIROFIBAN HCL IN NACL 5-0.9 MG/100ML-% IV SOLN
INTRAVENOUS | Status: AC
Start: 2023-11-21 — End: ?
  Filled 2023-11-21: qty 100

## 2023-11-21 MED ORDER — ADENOSINE 6 MG/2ML IV SOLN
INTRAVENOUS | Status: AC
  Filled 2023-11-21: qty 2

## 2023-11-21 MED ORDER — SODIUM CHLORIDE 0.9% FLUSH: 3.0000 mL | INTRAVENOUS | Status: DC | PRN

## 2023-11-21 MED ORDER — ATROPINE SULFATE 1 MG/10ML IJ SOSY
PREFILLED_SYRINGE | INTRAMUSCULAR | Status: AC
  Filled 2023-11-21: qty 10

## 2023-11-21 MED ORDER — ADENOSINE (DIAGNOSTIC) FOR INTRACORONARY USE
INTRAVENOUS | Status: DC | PRN
  Administered 2023-11-21 (×4): 12 ug via INTRACORONARY

## 2023-11-21 MED ORDER — SODIUM CHLORIDE 0.9 % IV SOLN: INTRAVENOUS | Status: AC

## 2023-11-21 MED ORDER — MIDAZOLAM HCL 2 MG/2ML IJ SOLN
INTRAMUSCULAR | Status: AC
  Filled 2023-11-21: qty 2

## 2023-11-21 MED ORDER — TRAZODONE HCL 50 MG PO TABS
50.0000 mg | ORAL_TABLET | Freq: Every evening | ORAL | Status: DC | PRN
Start: 2023-11-21 — End: 2023-12-05
  Administered 2023-11-21 – 2023-12-03 (×8): 50 mg via ORAL
  Filled 2023-11-21 (×8): qty 1

## 2023-11-21 MED ORDER — TIROFIBAN (AGGRASTAT) 50MCG/ML FOR INTRACORONARY USE
INTRACORONARY | Status: DC | PRN
  Administered 2023-11-21 (×2): 20 mL via INTRACORONARY

## 2023-11-21 MED ORDER — LIDOCAINE HCL (PF) 1 % IJ SOLN
INTRAMUSCULAR | Status: DC | PRN
  Administered 2023-11-21: 12 mL

## 2023-11-21 MED ORDER — ALLOPURINOL 300 MG PO TABS
150.0000 mg | ORAL_TABLET | Freq: Every day | ORAL | Status: DC
  Administered 2023-11-21 – 2023-12-05 (×15): 150 mg via ORAL
  Filled 2023-11-21 (×15): qty 1

## 2023-11-21 MED ORDER — FENTANYL CITRATE (PF) 100 MCG/2ML IJ SOLN
INTRAMUSCULAR | Status: DC | PRN
  Administered 2023-11-21: 25 ug via INTRAVENOUS

## 2023-11-21 MED ORDER — LIDOCAINE HCL (PF) 1 % IJ SOLN
INTRAMUSCULAR | Status: AC
  Filled 2023-11-21: qty 30

## 2023-11-21 MED ORDER — LABETALOL HCL 5 MG/ML IV SOLN: 10.0000 mg | INTRAVENOUS | Status: AC | PRN

## 2023-11-21 MED ORDER — FENTANYL CITRATE PF 50 MCG/ML IJ SOSY
50.0000 ug | PREFILLED_SYRINGE | INTRAMUSCULAR | Status: DC | PRN
  Administered 2023-11-21 – 2023-11-22 (×4): 50 ug via INTRAVENOUS
  Filled 2023-11-21 (×4): qty 1

## 2023-11-21 MED ORDER — AMLODIPINE BESYLATE 5 MG PO TABS
5.0000 mg | ORAL_TABLET | Freq: Every day | ORAL | Status: DC
  Administered 2023-11-21 – 2023-11-22 (×2): 5 mg via ORAL
  Filled 2023-11-21 (×2): qty 1

## 2023-11-21 MED ORDER — MIDAZOLAM HCL 2 MG/2ML IJ SOLN
INTRAMUSCULAR | Status: DC | PRN
  Administered 2023-11-21: 1 mg via INTRAVENOUS

## 2023-11-21 MED ORDER — ASPIRIN 81 MG PO TBEC
81.0000 mg | DELAYED_RELEASE_TABLET | Freq: Every day | ORAL | Status: DC
  Administered 2023-11-21 – 2023-12-05 (×15): 81 mg via ORAL
  Filled 2023-11-21 (×15): qty 1

## 2023-11-21 MED ORDER — IOHEXOL 350 MG/ML SOLN
INTRAVENOUS | Status: DC | PRN
  Administered 2023-11-21: 90 mL

## 2023-11-21 MED ORDER — SODIUM CHLORIDE 0.9% FLUSH
3.0000 mL | Freq: Two times a day (BID) | INTRAVENOUS | Status: DC
  Administered 2023-11-21 – 2023-11-23 (×6): 3 mL via INTRAVENOUS

## 2023-11-21 MED ORDER — HEPARIN SODIUM (PORCINE) 1000 UNIT/ML IJ SOLN
INTRAMUSCULAR | Status: DC | PRN
  Administered 2023-11-21: 10000 [IU] via INTRAVENOUS
  Administered 2023-11-21: 2000 [IU] via INTRAVENOUS

## 2023-11-21 MED ORDER — FINASTERIDE 5 MG PO TABS
5.0000 mg | ORAL_TABLET | Freq: Every day | ORAL | Status: DC
  Administered 2023-11-21 – 2023-12-05 (×15): 5 mg via ORAL
  Filled 2023-11-21 (×15): qty 1

## 2023-11-21 MED ORDER — NITROGLYCERIN IN D5W 200-5 MCG/ML-% IV SOLN
2.0000 ug/min | INTRAVENOUS | Status: DC
  Administered 2023-11-21: 5 ug/min via INTRAVENOUS
  Filled 2023-11-21: qty 250

## 2023-11-21 MED ORDER — SODIUM CHLORIDE 0.9 % IV SOLN: 250.0000 mL | INTRAVENOUS | Status: AC | PRN

## 2023-11-21 MED FILL — Verapamil HCl IV Soln 2.5 MG/ML: INTRAVENOUS | Qty: 2 | Status: AC

## 2023-11-21 SURGICAL SUPPLY — 22 items
BALLOON EMERGE MR 3.0X12 (BALLOONS) IMPLANT
BALLOON EMERGE MR 4.0X12 (BALLOONS) IMPLANT
BALLOON EUPHORA RX 4.0X12 (BALLOONS) IMPLANT
CANISTER PENUMBRA ENGINE (MISCELLANEOUS) IMPLANT
CATH EXTRAC PRONTO 5.5F 138CM (CATHETERS) IMPLANT
CATH EXTRAC PRONTO LP 6F RND (CATHETERS) IMPLANT
CATH INDIGO CAT RX KIT (CATHETERS) IMPLANT
CATH LAUNCHER 6FR AL.75 (CATHETERS) IMPLANT
COVER PRB 48X5XTLSCP FOLD TPE (BAG) IMPLANT
GLIDESHEATH SLEND A-KIT 6F 22G (SHEATH) IMPLANT
GUIDEWIRE INQWIRE 1.5J.035X260 (WIRE) IMPLANT
KIT ENCORE 26 ADVANTAGE (KITS) IMPLANT
KIT HEMO VALVE WATCHDOG (MISCELLANEOUS) IMPLANT
KIT MICROPUNCTURE NIT STIFF (SHEATH) IMPLANT
KIT SYRINGE INJ CVI SPIKEX1 (MISCELLANEOUS) IMPLANT
PACK CARDIAC CATHETERIZATION (CUSTOM PROCEDURE TRAY) ×1 IMPLANT
SET ATX-X65L (MISCELLANEOUS) IMPLANT
SHEATH PINNACLE 6F 10CM (SHEATH) IMPLANT
WIRE ASAHI PROWATER 300CM (WIRE) IMPLANT
WIRE EMERALD 3MM-J .035X150CM (WIRE) IMPLANT
WIRE HI TORQ WHISPER MS 190CM (WIRE) IMPLANT
WIRE MICRO SET SILHO 5FR 7 (SHEATH) IMPLANT

## 2023-11-21 NOTE — Progress Notes (Addendum)
   Patient Name: Ricky Bailey Date of Encounter: 11/21/2023 [facility] Cardiologist: Abel Hoe  Interval Summary  .    S/p PCI with POBA of RCA yesterday without DES placed.  Patient with recurrent CP overnight with recurrent STEs  Patient c/o chest pain currently rated at 8/10.   Vital Signs .    Vitals:   11/21/23 0645 11/21/23 0700 11/21/23 0715 11/21/23 0730  BP: (!) 149/104 (!) 147/101 (!) 143/108 (!) 143/99  Pulse: 80 83 82 82  Resp: 18 18 11 18   Temp:   98.6 F (37 C)   TempSrc:   Oral   SpO2: 98% 97% 97% 97%  Weight:      Height:        Intake/Output Summary (Last 24 hours) at 11/21/2023 0748 Last data filed at 11/21/2023 0700 Gross per 24 hour  Intake 1584.59 ml  Output 775 ml  Net 809.59 ml      11/21/2023    5:00 AM 11/20/2023    1:26 PM 02/09/2023    4:15 AM  Last 3 Weights  Weight (lbs) 264 lb 1.8 oz 268 lb 15.4 oz   Weight (kg) 119.8 kg 122 kg      Information is confidential and restricted. Go to Review Flowsheets to unlock data.      Telemetry/ECG    SR occ PVCs- Personally Reviewed  Physical Exam .   GEN: Moderate distress   Neck: No JVD Cardiac: RRR, no murmurs, rubs, or gallops.  Respiratory: Clear to auscultation bilaterally. GI: Soft, nontender, non-distended  MS: No edema; RFA intact; R TR band in place  Assessment & Plan .      ACS/STEMI:  Patient s/p POBA and aspiration thrombectomy without DES placement.  Having recurrent CP and recurrent STEs >> refer for emergent re-look angiography now.  Discussed with Dr. Filiberto Hug.  Depending on results, may continued med rx.    -Stop Aggrastat for procedure  -Cont Effient 10  -ASA 81  -Crestor  40  -F/u TTE 2.   HL  -Crestor  40, goal LDL < 55 3.   VF arrest:  -Continue amiodarone, change to PO today  I have reviewed the risks, indications, and alternatives to cardiac catheterization, possible angioplasty, and stenting with the patient. Risks include but are not limited to  bleeding, infection, vascular injury, stroke, myocardial infection, arrhythmia, kidney injury, radiation-related injury in the case of prolonged fluoroscopy use, emergency cardiac surgery, and death. The patient understands the risks of serious complication is 1-2 in 1000 with diagnostic cardiac cath and 1-2% or less with angioplasty/stenting.     For questions or updates, please contact [facility] Please consult [email or web address] for contact info under        Signed, Maeva Dant K Travian Kerner, MD

## 2023-11-21 NOTE — Progress Notes (Signed)
 Rt groin sheath D/Ced kinked but intact. Waveform positional. No flash noted with cath removal. Direct pressure applied to groin site times 20 min. No hematoma noted at groin. 2nd RN and atropine present  at bedside for procedure. Pt instructed to cover groin with coughing and sneezing. Bedrest to be maintained x 6 hours.

## 2023-11-21 NOTE — Progress Notes (Signed)
 2D echo attempted, RN requested to come back later.

## 2023-11-21 NOTE — Progress Notes (Signed)
 2D echo attempted, RN pulling sheath. Will try later.

## 2023-11-21 NOTE — Progress Notes (Signed)
 Received call about chest pain and ST Elevation that is pronounced from this morning.  Reviewed his angio, EKG. Patient has completed the inferior STEMI and has prominent Q in the inferior leads and STE in V5 and 6 more prominent than EKG from yesterday at 3 pm.  Patient had extensive thrombus burden in a very ectatic vessel. No indication for activating STEMI, will study him again this morning as a first case. Unfortunately, do not think this vessel is salvagable and even if it is, suspect he has completed his infarct.  Start IV NTG (hypertensive) and fentanyl PRN for chest pain.     Knox Perl, MD, [facility] 11/21/2023, 6:01 AM [facility] [address] [place], [place] [address] Phone: [phone]. Fax:  [phone]

## 2023-11-21 NOTE — Progress Notes (Addendum)
 RN brought femstop and atropine to the bedside for sheath removal. RN informed the patient of the process for the sheath removal and the signs and symptoms associated with r/p bleeding. Patient's 6 Fr femoral arterial sheath pulled at 1525. Pressure held for 30 minutes (1555). Patient's vital signs, mental status, and right dorsalis pedis pulse checked every 5 minutes with no complications.

## 2023-11-22 ENCOUNTER — Inpatient Hospital Stay (HOSPITAL_COMMUNITY)

## 2023-11-22 DIAGNOSIS — R079 Chest pain, unspecified: Secondary | ICD-10-CM

## 2023-11-22 DIAGNOSIS — I2119 ST elevation (STEMI) myocardial infarction involving other coronary artery of inferior wall: Secondary | ICD-10-CM | POA: Diagnosis not present

## 2023-11-22 LAB — BASIC METABOLIC PANEL WITH GFR
Anion gap: 9 (ref 5–15)
BUN: 12 mg/dL (ref 8–23)
CO2: 22 mmol/L (ref 22–32)
Calcium: 8.1 mg/dL — ABNORMAL LOW (ref 8.9–10.3)
Chloride: 104 mmol/L (ref 98–111)
Creatinine, Ser: 1.03 mg/dL (ref 0.61–1.24)
GFR, Estimated: >60 mL/min (ref >60)
Glucose, Bld: 130 mg/dL — ABNORMAL HIGH (ref 70–99)
Potassium: 3.8 mmol/L (ref 3.5–5.1)
Sodium: 135 mmol/L (ref 135–145)

## 2023-11-22 LAB — CBC
HCT: 37.9 % — ABNORMAL LOW (ref 39.0–52.0)
Hemoglobin: 12.9 g/dL — ABNORMAL LOW (ref 13.0–17.0)
MCH: 30.5 pg (ref 26.0–34.0)
MCHC: 34 g/dL (ref 30.0–36.0)
MCV: 89.6 fL (ref 80.0–100.0)
Platelets: 192 10*3/uL (ref 150–400)
RBC: 4.23 MIL/uL (ref 4.22–5.81)
RDW: 13 % (ref 11.5–15.5)
WBC: 11.4 10*3/uL — ABNORMAL HIGH (ref 4.0–10.5)
nRBC: 0 % (ref 0.0–0.2)

## 2023-11-22 LAB — LIPOPROTEIN A (LPA): Lipoprotein (a): 76.7 nmol/L — ABNORMAL HIGH (ref <75.0)

## 2023-11-22 LAB — ECHOCARDIOGRAM COMPLETE
AR max vel: 2.68 cm2
AV Area VTI: 3.05 cm2
AV Area mean vel: 2.4 cm2
AV Mean grad: 3 mmHg
AV Peak grad: 7.2 mmHg
Ao pk vel: 1.34 m/s
Area-P 1/2: 10.54 cm2
Height: 71 in
MV VTI: 3.98 cm2
S' Lateral: 2.9 cm
Single Plane A4C EF: 72.1 %
Weight: 4225.78 [oz_av]

## 2023-11-22 LAB — C-REACTIVE PROTEIN: CRP: 13.4 mg/dL — ABNORMAL HIGH (ref <1.0)

## 2023-11-22 LAB — SEDIMENTATION RATE: Sed Rate: 28 mm/h — ABNORMAL HIGH (ref 0–16)

## 2023-11-22 MED ORDER — NITROGLYCERIN IN D5W 200-5 MCG/ML-% IV SOLN
0.0000 ug/min | INTRAVENOUS | Status: DC
  Administered 2023-11-22: 20 ug/min via INTRAVENOUS
  Filled 2023-11-22: qty 250

## 2023-11-22 MED ORDER — NITROGLYCERIN 0.4 MG/HR TD PT24
0.4000 mg | MEDICATED_PATCH | Freq: Every day | TRANSDERMAL | Status: DC
  Administered 2023-11-22: 0.4 mg via TRANSDERMAL
  Filled 2023-11-22: qty 1

## 2023-11-22 MED ORDER — POTASSIUM CHLORIDE CRYS ER 20 MEQ PO TBCR
40.0000 meq | EXTENDED_RELEASE_TABLET | Freq: Once | ORAL | Status: AC
  Administered 2023-11-22: 40 meq via ORAL
  Filled 2023-11-22: qty 2

## 2023-11-22 MED ORDER — COLCHICINE 0.6 MG PO TABS
0.6000 mg | ORAL_TABLET | Freq: Every day | ORAL | Status: DC
  Administered 2023-11-22 – 2023-11-25 (×4): 0.6 mg via ORAL
  Filled 2023-11-22 (×4): qty 1

## 2023-11-22 MED ORDER — ISOSORBIDE MONONITRATE ER 30 MG PO TB24: 15.0000 mg | ORAL_TABLET | Freq: Every day | ORAL | Status: DC

## 2023-11-22 MED ORDER — METOPROLOL TARTRATE 25 MG PO TABS
25.0000 mg | ORAL_TABLET | Freq: Two times a day (BID) | ORAL | Status: DC
  Administered 2023-11-22 (×2): 25 mg via ORAL
  Filled 2023-11-22 (×2): qty 1

## 2023-11-22 MED ORDER — PERFLUTREN LIPID MICROSPHERE
1.0000 mL | INTRAVENOUS | Status: AC | PRN
  Administered 2023-11-22: 3 mL via INTRAVENOUS

## 2023-11-22 MED ORDER — NITROGLYCERIN 0.4 MG SL SUBL
0.4000 mg | SUBLINGUAL_TABLET | Freq: Once | SUBLINGUAL | Status: AC
  Administered 2023-11-22: 0.4 mg via SUBLINGUAL
  Filled 2023-11-22: qty 1

## 2023-11-22 MED ORDER — NITROGLYCERIN 0.4 MG/HR TD PT24: 0.4000 mg | MEDICATED_PATCH | Freq: Every day | TRANSDERMAL | Status: DC

## 2023-11-22 NOTE — Progress Notes (Addendum)
 Advanced Heart Failure Rounding Note  Cardiologist: None  Chief Complaint: STEMI Subjective:    Residual CP/pressure 7/10. No SOB. Tachypnea.   Objective:    Weight Range: 119.8 kg Body mass index is 36.84 kg/m.   Vital Signs:   Temp:  [98.5 F (36.9 C)-98.9 F (37.2 C)] 98.5 F (36.9 C) (05/14 2350) Pulse Rate:  [68-104] 99 (05/15 0645) Resp:  [0-43] 26 (05/15 0645) BP: (87-132)/(68-114) 99/68 (05/15 0645) SpO2:  [93 %-100 %] 94 % (05/15 0645) Arterial Line BP: (123-153)/(72-92) 133/77 (05/14 1520) Last BM Date : 11/19/23  Weight change: Filed Weights   11/20/23 1326 11/21/23 0500  Weight: 122 kg 119.8 kg   Intake/Output:  Intake/Output Summary (Last 24 hours) at 11/22/2023 0752 Last data filed at 11/22/2023 0500 Gross per 24 hour  Intake 1819.34 ml  Output 750 ml  Net 1069.34 ml   Physical Exam    General: Diaphoretic appearing Cardiac: JVP ~8cm. S1 and S2 present. No murmurs or rub. Resp: tachypneic Extremities: Warm and dry.  No peripheral edema.  Neuro: Alert and oriented x3. Affect pleasant.  Telemetry   ST 110s with short intermittent runs on NSVT (personally reviewed)  EKG    ST 102 bpm, ST elevation in inferior leads  Labs    CBC Recent Labs    11/20/23 1311 11/20/23 1314 11/21/23 0313 11/22/23 0249  WBC 6.8  --  8.7 11.4*  NEUTROABS 3.5  --   --   --   HGB 15.1   < > 15.3 12.9*  HCT 45.2   < > 45.1 37.9*  MCV 89.9  --  88.8 89.6  PLT 205  --  220 192   < > = values in this interval not displayed.   Basic Metabolic Panel Recent Labs    42/59/56 0313 11/22/23 0249  NA 136 135  K 3.7 3.8  CL 106 104  CO2 20* 22  GLUCOSE 135* 130*  BUN 14 12  CREATININE 1.05 1.03  CALCIUM  8.8* 8.1*   Liver Function Tests Recent Labs    11/20/23 2000  AST 381*  ALT 77*  ALKPHOS 36*  BILITOT 1.1  PROT 6.5  ALBUMIN 3.8   Hemoglobin A1C Recent Labs    11/20/23 1311  HGBA1C 5.8*   Fasting Lipid Panel Recent Labs     11/20/23 1311  CHOL 167  HDL 32*  LDLCALC 122*  TRIG 65  CHOLHDL 5.2   Imaging   CARDIAC CATHETERIZATION Result Date: 11/21/2023 Coronary intervention 11/21/2023: RCA: Proximally occluded Unsuccessful percutaneous coronary intervention RCA     Aspiration thrombectomy     Intracoronary Aggrastat     PTCA 99% residual stenosis throughout the vessel with no flow in the distal bed Extremely challenging and unusually long procedure due to need for multiple attempts of aspiration medical thrombectomy, PTCA, intracoronary adenosine and Aggrastat.  Procedure aborted given futility of stenting at this point given likely completed infarct, very high risk of no reflow. Manish J Patwardhan, MD  Medications:    Scheduled Medications:  allopurinol   150 mg Oral Daily   amiodarone  400 mg Oral BID   amLODipine   5 mg Oral Daily   aspirin EC  81 mg Oral Daily   Chlorhexidine Gluconate Cloth  6 each Topical Daily   finasteride   5 mg Oral Daily   heparin  5,000 Units Subcutaneous Q8H   nitroGLYCERIN  0.4 mg Transdermal Daily   potassium chloride  SA  80 mEq Oral Daily  prasugrel  10 mg Oral Daily   rosuvastatin   40 mg Oral Daily   sodium chloride  flush  3 mL Intravenous Q12H   sodium chloride  flush  3 mL Intravenous Q12H   Infusions:  sodium chloride  Stopped (11/21/23 1515)   PRN Medications: sodium chloride , acetaminophen , fentaNYL (SUBLIMAZE) injection, magnesium  hydroxide, morphine  injection, ondansetron  (ZOFRAN ) IV, mouth rinse, oxyCODONE , sodium chloride  flush, traZODone   Patient Profile   Ricky Bailey is a 68 y.o. male with depression, HTN, HLD, obesity, BPH, and OSA. Admitted with STEMI  Assessment/Plan   Acute STEMI - LHC 5/13: thrombotic occlusion of larg caliber RCA s/p balloon angio and aspiration thrombectomy. Minimal disease in LAD and LCX. C/B VF arrest in cath lab. - Repeat LHC 5/14: unsuccessful PCI. 99% residual stenosis  - stop NTG patch. Will plan for NTG gtt is responds  to SL - continue norvasc  5 mg daily - start lopressor 25 mg bid - continue ASA + statin + Effient daily  2. VF arrest - in the setting of acute STEMI and failed revasc attempts - tele with ST 100s with residual ST elevation and intermittent, short NSVT runs (<8) - continue amio 400 mg bid - start lopressor 25 mg daily  3. HFpEF - echo with EF 60-65%, G1DD, mildly reduced RV - JVP slightly up, however IVC with resp variability on echo  4. HTN  - normotensive - norvasc  5 mg, NTG as above  5. Ascending Aortic Aneurysm - measuring 4.2 mm  Length of Stay: 2  [place] Lee, NP  11/22/2023, 7:52 AM  Advanced Heart Failure Team Pager [phone] (M-F; 7a - 5p)  Please contact [facility] for night-coverage after hours (5p -7a ) and weekends on [email or web address]  Agree with above.   Still with 7/10 CP.   Echo and cath films reviewed personally. EF 60-65% RV mildly reduced  General:  Uncomfortable. No resp difficulty HEENT: normal Neck: supple. no JVD. Carotids 2+ bilat; no bruits. No lymphadenopathy or thryomegaly appreciated. Cor: PMI nondisplaced. Regular rate & rhythm. No rubs, gallops or murmurs. Lungs: clear Abdomen: soft, nontender, nondistended. No hepatosplenomegaly. No bruits or masses. Good bowel sounds. Extremities: no cyanosis, clubbing, rash, edema Neuro: alert & orientedx3, cranial nerves grossly intact. moves all 4 extremities w/o difficulty. Affect pleasant  Still with ongoing 7/10 CP. Will give sl NTG and if responsive will place on NTG gtt. Will also give morphine  and start b-blocker.  I do not hear a rub on exam.   With ongoing CP will keep in ICU today.   CRITICAL CARE Performed by: Jules Oar  Total critical care time: 45 minutes  Critical care time was exclusive of separately billable procedures and treating other patients.  Critical care was necessary to treat or prevent imminent or life-threatening deterioration.  Critical care was time spent  personally by me (independent of midlevel providers or residents) on the following activities: development of treatment plan with patient and/or surrogate as well as nursing, discussions with consultants, evaluation of patient's response to treatment, examination of patient, obtaining history from patient or surrogate, ordering and performing treatments and interventions, ordering and review of laboratory studies, ordering and review of radiographic studies, pulse oximetry and re-evaluation of patient's condition.  Jules Oar, MD  3:41 PM

## 2023-11-22 NOTE — TOC Initial Note (Signed)
 Transition of Care [facility]) - Initial/Assessment Note    Patient Details  Name: Ricky Bailey MRN: [id number] Date of Birth: 02-Jul-1956  Transition of Care [facility]) CM/SW Contact:    Ernst Heap Phone Number: [phone] 11/22/2023, 2:20 PM  Clinical Narrative:    HF CSW met with patient and daughter at bedside. Patient stated that he lives alone. Patient stated that he has no history of HH services. Patient stated that he does not use any equipment. Patient stated that he does not have a scale at home.  Patient stated that he still drives. Patient stated that he recently started working back FT. Patient inquired about FMLA paperwork and ST/LTD. Patient and daughter stated that they will follow up after they check-in with HR. Patients daughter stated that family will transport at dc. Patient stated that he has a PCP. CSW explained that a hospital follow up will be scheduled closer towards dc.   TOC will continue following.               Expected Discharge Plan: Home/Self Care Barriers to Discharge: Continued Medical Work up   Patient Goals and CMS Choice            Expected Discharge Plan and Services       Living arrangements for the past 2 months: Apartment                                      Prior Living Arrangements/Services Living arrangements for the past 2 months: Apartment Lives with:: Self Patient language and need for interpreter reviewed:: Yes Do you feel safe going back to the place where you live?: Yes      Need for Family Participation in Patient Care: No (Comment) Care giver support system in place?: No (comment)   Criminal Activity/Legal Involvement Pertinent to Current Situation/Hospitalization: No - Comment as needed  Activities of Daily Living   ADL Screening (condition at time of admission) Independently performs ADLs?: Yes (appropriate for developmental age) Is the patient deaf or have difficulty hearing?: No Does the patient have  difficulty seeing, even when wearing glasses/contacts?: No Does the patient have difficulty concentrating, remembering, or making decisions?: No  Permission Sought/Granted                  Emotional Assessment Appearance:: Appears stated age Attitude/Demeanor/Rapport: Engaged Affect (typically observed): Appropriate Orientation: : Oriented to Self, Oriented to Place, Oriented to  Time, Oriented to Situation Alcohol / Substance Use: Not Applicable Psych Involvement: No (comment)  Admission diagnosis:  Acute ST elevation myocardial infarction (STEMI) of inferior wall (HCC) [I21.19] Patient Active Problem List   Diagnosis Date Noted   Acute ST elevation myocardial infarction (STEMI) of inferior wall (HCC) 11/20/2023   MDD (major depressive disorder), single episode, severe , no psychosis (HCC) 02/08/2023   Alcohol abuse 02/08/2023   Acute respiratory failure due to COVID-19 (HCC) 08/14/2019   Pneumonia due to COVID-19 virus 08/14/2019   Hyponatremia 08/14/2019   Hypokalemia 08/14/2019   Hypercholesteremia 08/14/2019   BPH (benign prostatic hyperplasia) 08/14/2019   Acute respiratory failure with hypoxia (HCC) 08/14/2019   PPD positive, treated 08/14/2019   BPPV (benign paroxysmal positional vertigo) 03/28/2018   Vomiting 03/28/2018   Benign essential HTN 03/28/2018   Obesity (BMI 30-39.9) 03/28/2018   PCP:  Lonni Robert, MD Pharmacy:   [id number] - [place], [place] - [address]  [facility] [address] [place] [address] Phone: [phone] Fax: [phone]  [facility] [address] [place] [address] Phone: [phone] Fax: [phone]     Social Drivers of Health (SDOH) Social History: SDOH Screenings   Food Insecurity: No Food Insecurity (11/20/2023)  Housing: Low Risk  (11/20/2023)  Transportation Needs: No Transportation Needs (11/20/2023)  Utilities: Not At Risk (11/20/2023)  Alcohol Screen: Medium  Risk (02/09/2023)  Social Connections: Moderately Isolated (11/20/2023)  Tobacco Use: Medium Risk (11/20/2023)   SDOH Interventions:     Readmission Risk Interventions     No data to display

## 2023-11-22 NOTE — Progress Notes (Addendum)
   Patient Name: Ricky Bailey Date of Encounter: 11/22/2023 [facility] Cardiologist: Abel Hoe  Interval Summary  .    Relook angio yesterday due to recurrent STE and CP >>unsuccessful PCI >> occluded large RCA  Patient continue to have low level chest discomfort and mild dyspnea  BP marginal   Vital Signs .    Vitals:   11/22/23 0545 11/22/23 0600 11/22/23 0630 11/22/23 0645  BP:   99/77 99/68  Pulse: (!) 104 (!) 102 (!) 102 99  Resp: (!) 21 (!) 28 (!) 32 (!) 26  Temp:      TempSrc:      SpO2: 94% 96% 95% 94%  Weight:      Height:        Intake/Output Summary (Last 24 hours) at 11/22/2023 0753 Last data filed at 11/22/2023 0500 Gross per 24 hour  Intake 1819.34 ml  Output 750 ml  Net 1069.34 ml      11/21/2023    5:00 AM 11/20/2023    1:26 PM 02/09/2023    4:15 AM  Last 3 Weights  Weight (lbs) 264 lb 1.8 oz 268 lb 15.4 oz   Weight (kg) 119.8 kg 122 kg      Information is confidential and restricted. Go to Review Flowsheets to unlock data.      Telemetry/ECG    SR occ PVCs- Personally Reviewed  Physical Exam .   GEN: Moderate distress   Neck: JVB ~10 Cardiac: RRR, no murmurs, rubs, or gallops.  Respiratory: Clear to auscultation bilaterally. GI: Soft, nontender, non-distended  MS: No edema; RFA intact; warm well perfused  Assessment & Plan .      ACS/STEMI:  Per patient, left arm pain started Monday AM >> presented Tuesday 24 hours after pain started. S/p POBA and aspiration thrombectomy without DES placement 5/13.  Recurrent CP 5/14 >> relook >> occluded large RCA >> med rx  -Cont Effient 10  -ASA 81  -Crestor  40  -Morphine   -F/u TTE 2.   RV infarction:  BP marginal  -F/U TTE -Hold BB given large RV infarction -AHF to see today  -Remain in 2H 3.   HL  -Crestor  40, goal LDL < 55 3.   VF arrest:  No recurrent VT/VF  -Continue amiodarone 400 bid  I have reviewed the risks, indications, and alternatives to cardiac catheterization,  possible angioplasty, and stenting with the patient. Risks include but are not limited to bleeding, infection, vascular injury, stroke, myocardial infection, arrhythmia, kidney injury, radiation-related injury in the case of prolonged fluoroscopy use, emergency cardiac surgery, and death. The patient understands the risks of serious complication is 1-2 in 1000 with diagnostic cardiac cath and 1-2% or less with angioplasty/stenting.     For questions or updates, please contact [facility] Please consult [email or web address] for contact info under        Signed, Joanell Cressler K Babacar Haycraft, MD

## 2023-11-22 NOTE — H&P (View-Only) (Signed)
   Patient Name: Ricky Bailey Date of Encounter: 11/22/2023 [facility] Cardiologist: Abel Hoe  Interval Summary  .    Relook angio yesterday due to recurrent STE and CP >>unsuccessful PCI >> occluded large RCA  Patient continue to have low level chest discomfort and mild dyspnea  BP marginal   Vital Signs .    Vitals:   11/22/23 0545 11/22/23 0600 11/22/23 0630 11/22/23 0645  BP:   99/77 99/68  Pulse: (!) 104 (!) 102 (!) 102 99  Resp: (!) 21 (!) 28 (!) 32 (!) 26  Temp:      TempSrc:      SpO2: 94% 96% 95% 94%  Weight:      Height:        Intake/Output Summary (Last 24 hours) at 11/22/2023 0753 Last data filed at 11/22/2023 0500 Gross per 24 hour  Intake 1819.34 ml  Output 750 ml  Net 1069.34 ml      11/21/2023    5:00 AM 11/20/2023    1:26 PM 02/09/2023    4:15 AM  Last 3 Weights  Weight (lbs) 264 lb 1.8 oz 268 lb 15.4 oz   Weight (kg) 119.8 kg 122 kg      Information is confidential and restricted. Go to Review Flowsheets to unlock data.      Telemetry/ECG    SR occ PVCs- Personally Reviewed  Physical Exam .   GEN: Moderate distress   Neck: JVB ~10 Cardiac: RRR, no murmurs, rubs, or gallops.  Respiratory: Clear to auscultation bilaterally. GI: Soft, nontender, non-distended  MS: No edema; RFA intact; warm well perfused  Assessment & Plan .      ACS/STEMI:  Per patient, left arm pain started Monday AM >> presented Tuesday 24 hours after pain started. S/p POBA and aspiration thrombectomy without DES placement 5/13.  Recurrent CP 5/14 >> relook >> occluded large RCA >> med rx  -Cont Effient 10  -ASA 81  -Crestor  40  -Morphine   -F/u TTE 2.   RV infarction:  BP marginal  -F/U TTE -Hold BB given large RV infarction -AHF to see today  -Remain in 2H 3.   HL  -Crestor  40, goal LDL < 55 3.   VF arrest:  No recurrent VT/VF  -Continue amiodarone 400 bid  I have reviewed the risks, indications, and alternatives to cardiac catheterization,  possible angioplasty, and stenting with the patient. Risks include but are not limited to bleeding, infection, vascular injury, stroke, myocardial infection, arrhythmia, kidney injury, radiation-related injury in the case of prolonged fluoroscopy use, emergency cardiac surgery, and death. The patient understands the risks of serious complication is 1-2 in 1000 with diagnostic cardiac cath and 1-2% or less with angioplasty/stenting.     For questions or updates, please contact [facility] Please consult [email or web address] for contact info under        Signed, Joanell Cressler K Babacar Haycraft, MD

## 2023-11-22 NOTE — Progress Notes (Signed)
  Echocardiogram 2D Echocardiogram has been performed.  [name] L Tomoki Lucken RDCS 11/22/2023, 8:28 AM

## 2023-11-22 NOTE — Progress Notes (Signed)
   11/22/23 1500  Spiritual Encounters  Type of Visit Initial  Care provided to: Pt and family  Referral source Nurse (RN/NT/LPN)  Reason for visit Advance directives   Chaplain responding to a call for the RN that Pt was ready for notarization of ACD.  Chaplain arrived in the room find the Pt with daughter and sister at bedside.  Daughter presented the paperwork, chaplain examined it and found it complete and ready to go.  However, at this time of the day there was ot a notary available.  Made arrangements with the Pt to bring notary first thing in the morning.  Chaplain services remain available by Spiritual Consult or for emergent cases, paging [phone]  Chaplain Dewitte Foreman, MDiv Racer [email or web address] [phone]

## 2023-11-23 ENCOUNTER — Encounter (HOSPITAL_COMMUNITY)
Admission: EM | Disposition: A | Discharge status: Home Health | Payer: Self-pay | Source: Home / Self Care | Attending: Cardiovascular Disease

## 2023-11-23 ENCOUNTER — Inpatient Hospital Stay (HOSPITAL_COMMUNITY)

## 2023-11-23 DIAGNOSIS — I3139 Other pericardial effusion (noninflammatory): Secondary | ICD-10-CM

## 2023-11-23 DIAGNOSIS — I4901 Ventricular fibrillation: Secondary | ICD-10-CM | POA: Diagnosis not present

## 2023-11-23 DIAGNOSIS — I2119 ST elevation (STEMI) myocardial infarction involving other coronary artery of inferior wall: Secondary | ICD-10-CM | POA: Diagnosis not present

## 2023-11-23 DIAGNOSIS — I2510 Atherosclerotic heart disease of native coronary artery without angina pectoris: Secondary | ICD-10-CM | POA: Diagnosis not present

## 2023-11-23 HISTORY — PX: CORONARY STENT INTERVENTION: CATH118234

## 2023-11-23 HISTORY — PX: RIGHT/LEFT HEART CATH AND CORONARY ANGIOGRAPHY: CATH118266

## 2023-11-23 LAB — POCT I-STAT EG7
Acid-Base Excess: 0 mmol/L (ref 0.0–2.0)
Acid-base deficit: 1 mmol/L (ref 0.0–2.0)
Bicarbonate: 23.1 mmol/L (ref 20.0–28.0)
Bicarbonate: 24 mmol/L (ref 20.0–28.0)
Calcium, Ion: 1.15 mmol/L (ref 1.15–1.40)
Calcium, Ion: 1.17 mmol/L (ref 1.15–1.40)
HCT: 34 % — ABNORMAL LOW (ref 39.0–52.0)
HCT: 35 % — ABNORMAL LOW (ref 39.0–52.0)
Hemoglobin: 11.6 g/dL — ABNORMAL LOW (ref 13.0–17.0)
Hemoglobin: 11.9 g/dL — ABNORMAL LOW (ref 13.0–17.0)
O2 Saturation: 51 %
O2 Saturation: 55 %
Potassium: 4.2 mmol/L (ref 3.5–5.1)
Potassium: 4.2 mmol/L (ref 3.5–5.1)
Sodium: 132 mmol/L — ABNORMAL LOW (ref 135–145)
Sodium: 132 mmol/L — ABNORMAL LOW (ref 135–145)
TCO2: 24 mmol/L (ref 22–32)
TCO2: 25 mmol/L (ref 22–32)
pCO2, Ven: 36.6 mmHg — ABNORMAL LOW (ref 44–60)
pCO2, Ven: 37.2 mmHg — ABNORMAL LOW (ref 44–60)
pH, Ven: 7.408 (ref 7.25–7.43)
pH, Ven: 7.418 (ref 7.25–7.43)
pO2, Ven: 27 mmHg — CL (ref 32–45)
pO2, Ven: 28 mmHg — CL (ref 32–45)

## 2023-11-23 LAB — POCT I-STAT 7, (LYTES, BLD GAS, ICA,H+H)
Acid-base deficit: 2 mmol/L (ref 0.0–2.0)
Bicarbonate: 20.7 mmol/L (ref 20.0–28.0)
Calcium, Ion: 1.15 mmol/L (ref 1.15–1.40)
HCT: 35 % — ABNORMAL LOW (ref 39.0–52.0)
Hemoglobin: 11.9 g/dL — ABNORMAL LOW (ref 13.0–17.0)
O2 Saturation: 99 %
Potassium: 4.2 mmol/L (ref 3.5–5.1)
Sodium: 132 mmol/L — ABNORMAL LOW (ref 135–145)
TCO2: 22 mmol/L (ref 22–32)
pCO2 arterial: 29.7 mmHg — ABNORMAL LOW (ref 32–48)
pH, Arterial: 7.452 — ABNORMAL HIGH (ref 7.35–7.45)
pO2, Arterial: 115 mmHg — ABNORMAL HIGH (ref 83–108)

## 2023-11-23 LAB — ECHOCARDIOGRAM LIMITED
Height: 71 in
S' Lateral: 3.5 cm
Weight: 4225.78 [oz_av]

## 2023-11-23 LAB — BASIC METABOLIC PANEL WITH GFR
Anion gap: 9 (ref 5–15)
BUN: 19 mg/dL (ref 8–23)
CO2: 24 mmol/L (ref 22–32)
Calcium: 8.3 mg/dL — ABNORMAL LOW (ref 8.9–10.3)
Chloride: 101 mmol/L (ref 98–111)
Creatinine, Ser: 1.27 mg/dL — ABNORMAL HIGH (ref 0.61–1.24)
GFR, Estimated: >60 mL/min (ref >60)
Glucose, Bld: 103 mg/dL — ABNORMAL HIGH (ref 70–99)
Potassium: 3.9 mmol/L (ref 3.5–5.1)
Sodium: 134 mmol/L — ABNORMAL LOW (ref 135–145)

## 2023-11-23 LAB — MAGNESIUM: Magnesium: 2.2 mg/dL (ref 1.7–2.4)

## 2023-11-23 LAB — CG4 I-STAT (LACTIC ACID)
Lactic Acid, Venous: 0.9 mmol/L (ref 0.5–1.9)
Lactic Acid, Venous: 0.8 mmol/L (ref 0.5–1.9)

## 2023-11-23 LAB — COOXEMETRY PANEL
Carboxyhemoglobin: 1.4 % (ref 0.5–1.5)
Carboxyhemoglobin: 1.7 % — ABNORMAL HIGH (ref 0.5–1.5)
Methemoglobin: <0.7 % (ref 0.0–1.5)
Methemoglobin: <0.7 % (ref 0.0–1.5)
O2 Saturation: 52.9 %
O2 Saturation: 79.8 %
Total hemoglobin: 12 g/dL (ref 12.0–16.0)
Total hemoglobin: 11.7 g/dL — ABNORMAL LOW (ref 12.0–16.0)

## 2023-11-23 LAB — POCT ACTIVATED CLOTTING TIME
Activated Clotting Time: 279 s
Activated Clotting Time: 273 s

## 2023-11-23 SURGERY — RIGHT/LEFT HEART CATH AND CORONARY ANGIOGRAPHY
Anesthesia: LOCAL

## 2023-11-23 MED ORDER — HEPARIN SODIUM (PORCINE) 1000 UNIT/ML IJ SOLN
INTRAMUSCULAR | Status: AC
  Filled 2023-11-23: qty 10

## 2023-11-23 MED ORDER — MIDAZOLAM HCL 2 MG/2ML IJ SOLN
INTRAMUSCULAR | Status: DC | PRN
  Administered 2023-11-23 (×2): 1 mg via INTRAVENOUS

## 2023-11-23 MED ORDER — SODIUM CHLORIDE 0.9 % IV SOLN
250.0000 mL | INTRAVENOUS | Status: DC
  Administered 2023-11-23: 250 mL via INTRAVENOUS

## 2023-11-23 MED ORDER — AMIODARONE HCL 200 MG PO TABS
200.0000 mg | ORAL_TABLET | Freq: Two times a day (BID) | ORAL | Status: DC
  Administered 2023-11-23 – 2023-11-24 (×3): 200 mg via ORAL
  Filled 2023-11-23 (×3): qty 1

## 2023-11-23 MED ORDER — POTASSIUM CHLORIDE CRYS ER 20 MEQ PO TBCR
20.0000 meq | EXTENDED_RELEASE_TABLET | Freq: Once | ORAL | Status: AC
  Administered 2023-11-23: 20 meq via ORAL
  Filled 2023-11-23: qty 1

## 2023-11-23 MED ORDER — FENTANYL CITRATE (PF) 100 MCG/2ML IJ SOLN
INTRAMUSCULAR | Status: DC | PRN
  Administered 2023-11-23 (×2): 25 ug via INTRAVENOUS

## 2023-11-23 MED ORDER — HEPARIN (PORCINE) IN NACL 1000-0.9 UT/500ML-% IV SOLN
INTRAVENOUS | Status: DC | PRN
  Administered 2023-11-23: 1000 mL

## 2023-11-23 MED ORDER — LIDOCAINE HCL (PF) 1 % IJ SOLN
INTRAMUSCULAR | Status: DC | PRN
  Administered 2023-11-23: 2 mL
  Administered 2023-11-23: 5 mL

## 2023-11-23 MED ORDER — FENTANYL CITRATE (PF) 100 MCG/2ML IJ SOLN
INTRAMUSCULAR | Status: AC
  Filled 2023-11-23: qty 2

## 2023-11-23 MED ORDER — LABETALOL HCL 5 MG/ML IV SOLN: 10.0000 mg | INTRAVENOUS | Status: AC | PRN

## 2023-11-23 MED ORDER — SODIUM CHLORIDE 0.9 % IV SOLN: 250.0000 mL | INTRAVENOUS | Status: DC | PRN

## 2023-11-23 MED ORDER — HYDRALAZINE HCL 20 MG/ML IJ SOLN: 10.0000 mg | INTRAMUSCULAR | Status: AC | PRN

## 2023-11-23 MED ORDER — HEPARIN SODIUM (PORCINE) 1000 UNIT/ML IJ SOLN
INTRAMUSCULAR | Status: DC | PRN
  Administered 2023-11-23: 10000 [IU] via INTRAVENOUS
  Administered 2023-11-23: 3000 [IU] via INTRAVENOUS

## 2023-11-23 MED ORDER — VERAPAMIL HCL 2.5 MG/ML IV SOLN
INTRAVENOUS | Status: AC
  Filled 2023-11-23: qty 2

## 2023-11-23 MED ORDER — SODIUM CHLORIDE 0.9% FLUSH: 3.0000 mL | INTRAVENOUS | Status: DC | PRN

## 2023-11-23 MED ORDER — SODIUM CHLORIDE 0.9% FLUSH
3.0000 mL | Freq: Two times a day (BID) | INTRAVENOUS | Status: DC
  Administered 2023-11-23 – 2023-11-24 (×3): 3 mL via INTRAVENOUS

## 2023-11-23 MED ORDER — DOBUTAMINE-DEXTROSE 4-5 MG/ML-% IV SOLN
2.5000 ug/kg/min | INTRAVENOUS | Status: DC
  Administered 2023-11-24 – 2023-11-26 (×2): 5 ug/kg/min via INTRAVENOUS
  Filled 2023-11-23 (×2): qty 250

## 2023-11-23 MED ORDER — NOREPINEPHRINE 4 MG/250ML-% IV SOLN
0.0000 ug/min | INTRAVENOUS | Status: DC
  Administered 2023-11-23: 2 ug/min via INTRAVENOUS
  Administered 2023-11-23: 3 ug/min via INTRAVENOUS
  Administered 2023-11-25: 1 ug/min via INTRAVENOUS
  Filled 2023-11-23 (×2): qty 250

## 2023-11-23 MED ORDER — NITROGLYCERIN 1 MG/10 ML FOR IR/CATH LAB
INTRA_ARTERIAL | Status: DC | PRN
  Administered 2023-11-23: 150 ug via INTRA_ARTERIAL

## 2023-11-23 MED ORDER — DOBUTAMINE-DEXTROSE 4-5 MG/ML-% IV SOLN
INTRAVENOUS | Status: AC
  Administered 2023-11-23: 2.5 ug/kg/min via INTRAVENOUS
  Filled 2023-11-23: qty 250

## 2023-11-23 MED ORDER — OXIDIZED CELLULOSE EX PADS
1.0000 | MEDICATED_PAD | Freq: Once | CUTANEOUS | Status: AC
  Administered 2023-11-23: 1 via TOPICAL
  Filled 2023-11-23: qty 1

## 2023-11-23 MED ORDER — IOHEXOL 350 MG/ML SOLN
INTRAVENOUS | Status: DC | PRN
  Administered 2023-11-23: 130 mL

## 2023-11-23 MED ORDER — NITROGLYCERIN 1 MG/10 ML FOR IR/CATH LAB
INTRA_ARTERIAL | Status: AC
  Filled 2023-11-23: qty 10

## 2023-11-23 MED ORDER — MIDAZOLAM HCL 2 MG/2ML IJ SOLN
INTRAMUSCULAR | Status: AC
  Filled 2023-11-23: qty 2

## 2023-11-23 MED ORDER — LIDOCAINE HCL (PF) 1 % IJ SOLN
INTRAMUSCULAR | Status: AC
  Filled 2023-11-23: qty 30

## 2023-11-23 SURGICAL SUPPLY — 22 items
BALLOON EMERGE MR 2.5X15 (BALLOONS) IMPLANT
CATH INFINITI 5FR MULTPACK ANG (CATHETERS) IMPLANT
CATH LAUNCHER 6FR JL4 (CATHETERS) IMPLANT
CATH SWAN GANZ 7F STRAIGHT (CATHETERS) IMPLANT
CATH VISTA GUIDE 6FR XBLD 3.5 (CATHETERS) IMPLANT
CLOSURE PERCLOSE PROSTYLE (VASCULAR PRODUCTS) IMPLANT
ELECT DEFIB PAD ADLT CADENCE (PAD) IMPLANT
GLIDESHEATH SLEND SS 6F .021 (SHEATH) IMPLANT
GUIDEWIRE INQWIRE 1.5J.035X260 (WIRE) IMPLANT
KIT ENCORE 26 ADVANTAGE (KITS) IMPLANT
KIT MICROPUNCTURE NIT STIFF (SHEATH) IMPLANT
PACK CARDIAC CATHETERIZATION (CUSTOM PROCEDURE TRAY) ×1 IMPLANT
SHEATH PINNACLE 5F 10CM (SHEATH) IMPLANT
SHEATH PINNACLE 6F 10CM (SHEATH) IMPLANT
SHEATH PINNACLE 7F 10CM (SHEATH) IMPLANT
SHEATH PROBE COVER 6X72 (BAG) IMPLANT
SHIELD CATH-GARD CONTAMINATION (MISCELLANEOUS) IMPLANT
STENT SYNERGY XD 3.0X16 (Permanent Stent) IMPLANT
STENT SYNERGY XD 3.50X24 (Permanent Stent) IMPLANT
WIRE EMERALD 3MM-J .025X260CM (WIRE) IMPLANT
WIRE EMERALD 3MM-J .035X150CM (WIRE) IMPLANT
WIRE RUNTHROUGH .014X180CM (WIRE) IMPLANT

## 2023-11-23 NOTE — Interval H&P Note (Signed)
 History and Physical Interval Note:  11/23/2023 10:27 AM  Ellie Gutta  has presented today for surgery, with the diagnosis of relook.  The various methods of treatment have been discussed with the patient and family. After consideration of risks, benefits and other options for treatment, the patient has consented to  Procedure(s): RIGHT/LEFT HEART CATH AND CORONARY ANGIOGRAPHY (N/A) as a surgical intervention.  The patient's history has been reviewed, patient examined, no change in status, stable for surgery.  I have reviewed the patient's chart and labs.  Questions were answered to the patient's satisfaction.    Patient with evidence of progressive right heart failure this morning, with worsening hypotension.  Also with new EKG changes with anteroseptal infarct pattern.  Patient with ongoing chest pain.  Considering his continued ischemic chest pain, EKG changes, and hypotension, he is referred for urgent right and left heart catheterization with plans to leave in a Swan-Ganz catheter through the right internal jugular venous access and evaluate his coronary anatomy.  PCI will be performed if indicated.  I discussed our plan with the patient's daughter.  Arnoldo Lapping

## 2023-11-23 NOTE — Progress Notes (Signed)
 Dr. Julane Ny made aware of drop in CI. MD at bedside. Obtained swan numbers  CO 3.7 CI 1.5 PAP 35/16 (29) Wedge 14 CVP 16 SVR 1464  MD ordered STAT coox and lactic. Doubutamine to be started at 2.5. repeat coox in 1 hour.

## 2023-11-23 NOTE — Progress Notes (Addendum)
 Advanced Heart Failure Rounding Note  Cardiologist: None  Chief Complaint: STEMI Subjective:    Residual CP/pressure 6/10. No SOB. Tachypnea.   This am with drop in BP to 80s. Nitroglycerin gtt held. Now appears to be in Mobitz type I HB.   Objective:    Weight Range: 119.8 kg Body mass index is 36.84 kg/m.   Vital Signs:   Temp:  [98.5 F (36.9 C)-99.1 F (37.3 C)] 98.8 F (37.1 C) (05/15 2330) Pulse Rate:  [71-103] 71 (05/16 0700) Resp:  [23-58] 46 (05/16 0700) BP: (84-124)/(60-93) 85/60 (05/16 0700) SpO2:  [88 %-99 %] 96 % (05/16 0700) Last BM Date : 11/19/23  Weight change: Filed Weights   11/20/23 1326 11/21/23 0500  Weight: 122 kg 119.8 kg   Intake/Output:  Intake/Output Summary (Last 24 hours) at 11/23/2023 0730 Last data filed at 11/23/2023 0700 Gross per 24 hour  Intake 370.21 ml  Output 100 ml  Net 270.21 ml   Physical Exam    General:  well appearing.  No respiratory difficulty.  Neck: supple. JVD ~8 cm.  Cor: PMI nondisplaced. Regular rate & irregular rhythm. No rubs, gallops or murmurs. Lungs: tachypneic, clear Extremities: no cyanosis, clubbing, rash, edema  Neuro: alert & oriented x 3. Moves all 4 extremities w/o difficulty. Affect pleasant.   Telemetry   Mobitz type 1 80s (personally reviewed)  EKG    STEMI. 76 bpm, PR unable to be calculated. QTc 409  Labs    CBC Recent Labs    11/20/23 1311 11/20/23 1314 11/21/23 0313 11/22/23 0249  WBC 6.8  --  8.7 11.4*  NEUTROABS 3.5  --   --   --   HGB 15.1   < > 15.3 12.9*  HCT 45.2   < > 45.1 37.9*  MCV 89.9  --  88.8 89.6  PLT 205  --  220 192   < > = values in this interval not displayed.   Basic Metabolic Panel Recent Labs    91/47/82 0249 11/23/23 0257  NA 135 134*  K 3.8 3.9  CL 104 101  CO2 22 24  GLUCOSE 130* 103*  BUN 12 19  CREATININE 1.03 1.27*  CALCIUM  8.1* 8.3*   Liver Function Tests Recent Labs    11/20/23 2000  AST 381*  ALT 77*  ALKPHOS 36*   BILITOT 1.1  PROT 6.5  ALBUMIN 3.8   Hemoglobin A1C Recent Labs    11/20/23 1311  HGBA1C 5.8*   Fasting Lipid Panel Recent Labs    11/20/23 1311  CHOL 167  HDL 32*  LDLCALC 122*  TRIG 65  CHOLHDL 5.2   Imaging   ECHOCARDIOGRAM COMPLETE Result Date: 11/22/2023    ECHOCARDIOGRAM REPORT   Patient Name:   Ricky Bailey Date of Exam: 11/22/2023 Medical Rec #:  [id number]      Height:       71.0 in Accession #:    [id number]     Weight:       264.1 lb Date of Birth:  [birth year]      BSA:          2.373 m Patient Age:    68 years       BP:           111/74 mmHg Patient Gender: M              HR:           102 bpm. Exam Location:  Inpatient Procedure:  2D Echo, Cardiac Doppler, Color Doppler and Intracardiac            Opacification Agent (Both Spectral and Color Flow Doppler were            utilized during procedure). Indications:    Chest pain  History:        Patient has no prior history of Echocardiogram examinations.                 Risk Factors:Hypertension. STEMI.  Sonographer:    Juanita Shaw Referring Phys: [id number] CHRISTOPHER D MCALHANY  Sonographer Comments: Image acquisition challenging due to patient body habitus. IMPRESSIONS  1. Poor acoustical windows limits ability to detect focal wall motion abnormalities even with definity contrast. The PSAX showed normal LVF. Left ventricular ejection fraction, by estimation, is 60 to 65%. The left ventricle has normal function. Left ventricular endocardial border not optimally defined to evaluate regional wall motion. There is mild concentric left ventricular hypertrophy. Left ventricular diastolic parameters are consistent with Grade I diastolic dysfunction (impaired relaxation).  2. Right ventricular systolic function is mildly reduced. The right ventricular size is mildly enlarged. Tricuspid regurgitation signal is inadequate for assessing PA pressure.  3. The mitral valve is normal in structure. No evidence of mitral valve regurgitation. No  evidence of mitral stenosis.  4. The aortic valve is normal in structure. Aortic valve regurgitation is trivial. No aortic stenosis is present. Aortic valve area, by VTI measures 3.05 cm. Aortic valve mean gradient measures 3.0 mmHg. Aortic valve Vmax measures 1.34 m/s.  5. Aortic dilatation noted. There is mild dilatation of the aortic root, measuring 41 mm. There is mild dilatation of the ascending aorta, measuring 42 mm.  6. The inferior vena cava is normal in size with greater than 50% respiratory variability, suggesting right atrial pressure of 3 mmHg. FINDINGS  Left Ventricle: Poor acoustical windows limits ability to detect focal wall motion abnormalities even with definity contrast. The PSAX showed normal LVF. Left ventricular ejection fraction, by estimation, is 60 to 65%. The left ventricle has normal function. Left ventricular endocardial border not optimally defined to evaluate regional wall motion. The left ventricular internal cavity size was normal in size. There is mild concentric left ventricular hypertrophy. Left ventricular diastolic parameters are consistent with Grade I diastolic dysfunction (impaired relaxation). Normal left ventricular filling pressure. Right Ventricle: The right ventricular size is mildly enlarged. No increase in right ventricular wall thickness. Right ventricular systolic function is mildly reduced. Tricuspid regurgitation signal is inadequate for assessing PA pressure. Left Atrium: Left atrial size was normal in size. Right Atrium: Right atrial size was normal in size. Pericardium: There is no evidence of pericardial effusion. Mitral Valve: The mitral valve is normal in structure. No evidence of mitral valve regurgitation. No evidence of mitral valve stenosis. MV peak gradient, 4.6 mmHg. The mean mitral valve gradient is 2.0 mmHg. Tricuspid Valve: The tricuspid valve is normal in structure. Tricuspid valve regurgitation is not demonstrated. No evidence of tricuspid  stenosis. Aortic Valve: The aortic valve is normal in structure. Aortic valve regurgitation is trivial. No aortic stenosis is present. Aortic valve mean gradient measures 3.0 mmHg. Aortic valve peak gradient measures 7.2 mmHg. Aortic valve area, by VTI measures 3.05 cm. Pulmonic Valve: The pulmonic valve was normal in structure. Pulmonic valve regurgitation is trivial. No evidence of pulmonic stenosis. Aorta: Aortic dilatation noted. There is mild dilatation of the aortic root, measuring 41 mm. There is mild dilatation of the ascending aorta, measuring 42  mm. Venous: The inferior vena cava is normal in size with greater than 50% respiratory variability, suggesting right atrial pressure of 3 mmHg. IAS/Shunts: No atrial level shunt detected by color flow Doppler.  LEFT VENTRICLE PLAX 2D LVIDd:         4.20 cm      Diastology LVIDs:         2.90 cm      LV e' medial:    11.30 cm/s LV PW:         1.20 cm      LV E/e' medial:  5.4 LV IVS:        1.30 cm      LV e' lateral:   10.10 cm/s LVOT diam:     2.20 cm      LV E/e' lateral: 6.1 LV SV:         51 LV SV Index:   21 LVOT Area:     3.80 cm  LV Volumes (MOD) LV vol d, MOD A4C: 127.0 ml LV vol s, MOD A4C: 35.4 ml LV SV MOD A4C:     127.0 ml RIGHT VENTRICLE             IVC RV Basal diam:  4.00 cm     IVC diam: 2.00 cm RV Mid diam:    2.50 cm RV S prime:     10.90 cm/s TAPSE (M-mode): 1.5 cm LEFT ATRIUM             Index        RIGHT ATRIUM           Index LA diam:        2.20 cm 0.93 cm/m   RA Area:     12.20 cm LA Vol (A2C):   34.3 ml 14.45 ml/m  RA Volume:   24.90 ml  10.49 ml/m LA Vol (A4C):   37.2 ml 15.68 ml/m LA Biplane Vol: 35.6 ml 15.00 ml/m  AORTIC VALVE                    PULMONIC VALVE AV Area (Vmax):    2.68 cm     PV Vmax:       0.86 m/s AV Area (Vmean):   2.40 cm     PV Peak grad:  3.0 mmHg AV Area (VTI):     3.05 cm AV Vmax:           134.00 cm/s AV Vmean:          83.300 cm/s AV VTI:            0.166 m AV Peak Grad:      7.2 mmHg AV Mean Grad:       3.0 mmHg LVOT Vmax:         94.60 cm/s LVOT Vmean:        52.700 cm/s LVOT VTI:          0.133 m LVOT/AV VTI ratio: 0.80  AORTA Ao Root diam: 4.10 cm Ao Asc diam:  4.30 cm MITRAL VALVE MV Area (PHT): 10.54 cm    SHUNTS MV Area VTI:   3.98 cm     Systemic VTI:  0.13 m MV Peak grad:  4.6 mmHg     Systemic Diam: 2.20 cm MV Mean grad:  2.0 mmHg MV Vmax:       1.07 m/s MV Vmean:      68.7 cm/s MV Decel Time: 72 msec MV E velocity: 61.30  cm/s MV A velocity: 103.00 cm/s MV E/A ratio:  0.60 Gaylyn Keas MD Electronically signed by Gaylyn Keas MD Signature Date/Time: 11/22/2023/8:54:47 AM    Final    Medications:    Scheduled Medications:  allopurinol   150 mg Oral Daily   amiodarone  400 mg Oral BID   amLODipine   5 mg Oral Daily   aspirin EC  81 mg Oral Daily   Chlorhexidine Gluconate Cloth  6 each Topical Daily   colchicine   0.6 mg Oral Daily   finasteride   5 mg Oral Daily   heparin  5,000 Units Subcutaneous Q8H   metoprolol tartrate  25 mg Oral BID   prasugrel  10 mg Oral Daily   rosuvastatin   40 mg Oral Daily   sodium chloride  flush  3 mL Intravenous Q12H   sodium chloride  flush  3 mL Intravenous Q12H   Infusions:  nitroGLYCERIN 10 mcg/min (11/23/23 0700)   PRN Medications: acetaminophen , fentaNYL (SUBLIMAZE) injection, magnesium  hydroxide, morphine  injection, ondansetron  (ZOFRAN ) IV, mouth rinse, oxyCODONE , sodium chloride  flush, traZODone   Patient Profile   Ricky Bailey is a 68 y.o. male with depression, HTN, HLD, obesity, BPH, and OSA. Admitted with STEMI  Assessment/Plan  Acute STEMI - LHC 5/13: thrombotic occlusion of larg caliber RCA s/p balloon angio and aspiration thrombectomy. Minimal disease in LAD and LCX. C/B VF arrest in cath lab. - Repeat LHC 5/14: unsuccessful PCI. 99% residual stenosis  - Echo 11/22/23: EF 60-65%, mild concentric LVH, GIDD, RV mildly reduced, no MR - Holding NTG gtt with hypotension - Hold lopressor and Norvasc  with hypotension - continue ASA +  statin + Effient daily  2. VF arrest - in the setting of acute STEMI and failed revasc attempts - tele with Mobitz type I today.  - Decrease amio 400>200 mg bid - Hold lopressor with SBP in 80s  3. HFpEF - echo with EF 60-65%, G1DD, mildly reduced RV - JVP slightly up, however IVC with resp variability on echo  4. Mobitz type 1 HB - Noticed on tele this morning around 0600.  - consistent with drop in BP - EKG again this morning with STEMI  4. HTN  - hypotensive this morning. Holding meds as above  5. Ascending Aortic Aneurysm - measuring 4.2 mm  Length of Stay: 3  Sheryl Donna, NP  11/23/2023, 7:30 AM  Advanced Heart Failure Team Pager [phone] (M-F; 7a - 5p)  Please contact [facility] for night-coverage after hours (5p -7a ) and weekends on [email or web address]  Agree with above.   Continued with 5/10 CP. ECG with new anteroseptal Q waves. Developed hypotension this am  Bedside echo done personally LVEF 35-40% with inferolateral AK. RV severely decreased  General:  Sitting up in be No resp difficulty HEENT: normal Neck: supple. JVP to jaw Cor: PMI nondisplaced. Regular rate & rhythm. No rubs, gallops or murmurs. Lungs: clear Abdomen: obese soft, nontender, nondistended. No hepatosplenomegaly. No bruits or masses. Good bowel sounds. Extremities: no cyanosis, clubbing, rash, edema Neuro: alert & orientedx3, cranial nerves grossly intact. moves all 4 extremities w/o difficulty. Affect pleasant  With ongoing CP and ECG changes have discussed the case with Dr. Arlester Ladd (IC). Will plan to return to cath lab for re-look coronary angio and placement of swan for further evaluation of associated RV infarct.    CRITICAL CARE Performed by: Jules Oar  Total critical care time: 50 minutes  Critical care time was exclusive of separately billable procedures and treating other patients.  Critical care was  necessary to treat or prevent imminent or life-threatening  deterioration.  Critical care was time spent personally by me (independent of midlevel providers or residents) on the following activities: development of treatment plan with patient and/or surrogate as well as nursing, discussions with consultants, evaluation of patient's response to treatment, examination of patient, obtaining history from patient or surrogate, ordering and performing treatments and interventions, ordering and review of laboratory studies, ordering and review of radiographic studies, pulse oximetry and re-evaluation of patient's condition.  Jules Oar, MD  10:54 AM

## 2023-11-23 NOTE — Progress Notes (Signed)
   CTSP due to worsening cardiac output  Patient feels weak. No CP or SOB.   Claudie Cull numbers done personally with RN at bedside.   CO 3.7 CI 1.5 PAP 35/16 (29) Wedge 14 CVP 16 SVR 1464 PAPI 1.2  Numbers c/w RV failure with low output  Check LA and co-ox. Will start DBA 2.5  Repeat hemodynamics in 1-2 hours.   D/w patient and his daughter.   Additional CCT 50 mins   Jules Oar, MD  6:44 PM

## 2023-11-24 DIAGNOSIS — I2119 ST elevation (STEMI) myocardial infarction involving other coronary artery of inferior wall: Secondary | ICD-10-CM | POA: Diagnosis not present

## 2023-11-24 LAB — CBC
HCT: 35.2 % — ABNORMAL LOW (ref 39.0–52.0)
Hemoglobin: 11.6 g/dL — ABNORMAL LOW (ref 13.0–17.0)
MCH: 30.1 pg (ref 26.0–34.0)
MCHC: 33 g/dL (ref 30.0–36.0)
MCV: 91.2 fL (ref 80.0–100.0)
Platelets: 173 10*3/uL (ref 150–400)
RBC: 3.86 MIL/uL — ABNORMAL LOW (ref 4.22–5.81)
RDW: 13.2 % (ref 11.5–15.5)
WBC: 12.2 10*3/uL — ABNORMAL HIGH (ref 4.0–10.5)
nRBC: 0 % (ref 0.0–0.2)

## 2023-11-24 LAB — BASIC METABOLIC PANEL WITH GFR
Anion gap: 9 (ref 5–15)
BUN: 27 mg/dL — ABNORMAL HIGH (ref 8–23)
CO2: 21 mmol/L — ABNORMAL LOW (ref 22–32)
Calcium: 7.8 mg/dL — ABNORMAL LOW (ref 8.9–10.3)
Chloride: 100 mmol/L (ref 98–111)
Creatinine, Ser: 1.27 mg/dL — ABNORMAL HIGH (ref 0.61–1.24)
GFR, Estimated: >60 mL/min (ref >60)
Glucose, Bld: 127 mg/dL — ABNORMAL HIGH (ref 70–99)
Potassium: 3.9 mmol/L (ref 3.5–5.1)
Sodium: 130 mmol/L — ABNORMAL LOW (ref 135–145)

## 2023-11-24 LAB — MAGNESIUM: Magnesium: 2.4 mg/dL (ref 1.7–2.4)

## 2023-11-24 MED ORDER — DIGOXIN 125 MCG PO TABS
0.1250 mg | ORAL_TABLET | Freq: Every day | ORAL | Status: DC
  Administered 2023-11-24 – 2023-12-05 (×12): 0.125 mg via ORAL
  Filled 2023-11-24 (×12): qty 1

## 2023-11-24 MED ORDER — SENNOSIDES-DOCUSATE SODIUM 8.6-50 MG PO TABS
2.0000 | ORAL_TABLET | Freq: Every day | ORAL | Status: AC
Start: 2023-11-24 — End: ?
  Administered 2023-11-24 – 2023-12-03 (×7): 2 via ORAL
  Filled 2023-11-24 (×9): qty 2

## 2023-11-24 MED ORDER — SODIUM CHLORIDE 0.9 % IV SOLN: INTRAVENOUS | Status: AC

## 2023-11-24 MED ORDER — SODIUM CHLORIDE 0.9% IV SOLUTION: INTRAVENOUS | Status: DC

## 2023-11-24 MED ORDER — POLYETHYLENE GLYCOL 3350 17 G PO PACK
17.0000 g | PACK | Freq: Every day | ORAL | Status: DC
  Administered 2023-11-24 – 2023-12-05 (×7): 17 g via ORAL
  Filled 2023-11-24 (×11): qty 1

## 2023-11-24 MED ORDER — SODIUM CHLORIDE 0.9% IV SOLUTION: INTRAVENOUS | Status: DC | PRN

## 2023-11-24 NOTE — Progress Notes (Signed)
 Advanced Heart Failure Rounding Note  Cardiologist: None  Chief Complaint: STEMI Subjective:    68 y/o male admitted with RCA STEMI and VF arrest. Unable to open RCA. Subsequently developed recurrent CP and ECG changes. Re-look cath 5/16 with new ruptured plaque in LAD -> stent. RHC with evidence for severe RV failure/shock  Echo EF 35-40% RV moderately HK  Now on DBA 5 and NE 3 Co-ox 80%  LA 0.8  Swan #s done personally with RN  RA 8 PA 31/21 (25) PCWP 14 TD 5.5/2.3 PAPi 13  Fees ok. No CP or SOB. No BM yet  Objective:    Weight Range: 119.8 kg Body mass index is 36.84 kg/m.   Vital Signs:   Temp:  [99.1 F (37.3 C)-101.7 F (38.7 C)] 100.6 F (38.1 C) (05/17 1100) Pulse Rate:  [63-106] 97 (05/17 1100) Resp:  [0-44] 35 (05/17 1100) BP: (83-128)/(60-93) 115/80 (05/17 1100) SpO2:  [70 %-99 %] 96 % (05/17 1100) Last BM Date : 11/19/23  Weight change: Filed Weights   11/20/23 1326 11/21/23 0500  Weight: 122 kg 119.8 kg   Intake/Output:  Intake/Output Summary (Last 24 hours) at 11/24/2023 1113 Last data filed at 11/24/2023 1100 Gross per 24 hour  Intake 1138.63 ml  Output 1200 ml  Net -61.37 ml   Physical Exam    General:  Lying in bed No resp difficulty HEENT: normal Neck: supple. RIJ swan. Carotids 2+ bilat; no bruits. No lymphadenopathy or thryomegaly appreciated. Cor: Regular rate & rhythm. No rubs, gallops or murmurs. Lungs: clear Abdomen: obese soft, nontender, nondistended. No hepatosplenomegaly. No bruits or masses. Good bowel sounds. Extremities: no cyanosis, clubbing, rash, edema Neuro: alert & orientedx3, cranial nerves grossly intact. moves all 4 extremities w/o difficulty. Affect pleasant   Telemetry   Sinus 90-100 Personally reviewed  Labs    CBC Recent Labs    11/22/23 0249 11/23/23 1052 11/23/23 1101 11/24/23 0511  WBC 11.4*  --   --  12.2*  HGB 12.9*   < > 11.9* 11.6*  HCT 37.9*   < > 35.0* 35.2*  MCV 89.6  --   --   91.2  PLT 192  --   --  173   < > = values in this interval not displayed.   Basic Metabolic Panel Recent Labs    16/10/96 0257 11/23/23 1052 11/23/23 1101 11/23/23 1820 11/24/23 0511  NA 134*   < > 132*  --  130*  K 3.9   < > 4.2  --  3.9  CL 101  --   --   --  100  CO2 24  --   --   --  21*  GLUCOSE 103*  --   --   --  127*  BUN 19  --   --   --  27*  CREATININE 1.27*  --   --   --  1.27*  CALCIUM  8.3*  --   --   --  7.8*  MG  --   --   --  2.2 2.4   < > = values in this interval not displayed.   Liver Function Tests No results for input(s): "AST", "ALT", "ALKPHOS", "BILITOT", "PROT", "ALBUMIN" in the last 72 hours.  Hemoglobin A1C No results for input(s): "HGBA1C" in the last 72 hours.  Fasting Lipid Panel No results for input(s): "CHOL", "HDL", "LDLCALC", "TRIG", "CHOLHDL", "LDLDIRECT" in the last 72 hours.  Imaging   No results found.  Medications:  Scheduled Medications:  allopurinol   150 mg Oral Daily   amiodarone   200 mg Oral BID   aspirin  EC  81 mg Oral Daily   Chlorhexidine  Gluconate Cloth  6 each Topical Daily   colchicine   0.6 mg Oral Daily   finasteride   5 mg Oral Daily   heparin   5,000 Units Subcutaneous Q8H   prasugrel   10 mg Oral Daily   rosuvastatin   40 mg Oral Daily   sodium chloride  flush  3 mL Intravenous Q12H   Infusions:  sodium chloride      DOBUTamine  5 mcg/kg/min (11/24/23 1100)   norepinephrine  (LEVOPHED ) Adult infusion 3 mcg/min (11/24/23 1100)   PRN Medications: sodium chloride , acetaminophen , fentaNYL  (SUBLIMAZE ) injection, magnesium  hydroxide, morphine  injection, ondansetron  (ZOFRAN ) IV, mouth rinse, oxyCODONE , sodium chloride  flush, traZODone   Patient Profile   Ricky Bailey is a 68 y.o. male with depression, HTN, HLD, obesity, BPH, and OSA. Admitted with STEMI  Assessment/Plan   CAD with acute inferior STEMI and RV infarct - LHC 5/13: thrombotic occlusion of larg caliber RCA s/p balloon angio and aspiration  thrombectomy. Minimal disease in LAD and LCX. C/B VF arrest in cath lab. - Repeat LHC 5/14: unsuccessful PCI. 99% residual stenosis  - Recurrent CP and ECG changes on 5/16 -> cath with 99% LAD stenosis due to new ruptured plaque -> DES  RHC with severe RV failure shock  2. Cardiogenic shock due to STEMI and RV infarct - echo 11/22/23 EF 60-65 - echo 5/16 EF 35-40% RV moderately down - Now on DBA 5 and NE 3 Co-ox 80%  LA 0.8 - Swan #s c/w RV infarct - Continue DBA Wean NE as tolerated - Add digoxin  3. VF arrest - in the setting of acute STEMI and failed revasc attempts - Rhythm stable on po amio. Will stop amio   4. Ascending Aortic Aneurysm - measuring 4.2 mm  CRITICAL CARE Performed by: Jules Oar  Total critical care time: 45 minutes  Critical care time was exclusive of separately billable procedures and treating other patients.  Critical care was necessary to treat or prevent imminent or life-threatening deterioration.  Critical care was time spent personally by me (independent of midlevel providers or residents) on the following activities: development of treatment plan with patient and/or surrogate as well as nursing, discussions with consultants, evaluation of patient's response to treatment, examination of patient, obtaining history from patient or surrogate, ordering and performing treatments and interventions, ordering and review of laboratory studies, ordering and review of radiographic studies, pulse oximetry and re-evaluation of patient's condition.   Length of Stay: 4  Jules Oar, MD  11/24/2023, 11:13 AM  Advanced Heart Failure Team Pager [phone] (M-F; 7a - 5p)  Please contact [facility] for night-coverage after hours (5p -7a ) and weekends on [email or web address]

## 2023-11-25 ENCOUNTER — Other Ambulatory Visit: Payer: Self-pay

## 2023-11-25 DIAGNOSIS — I2119 ST elevation (STEMI) myocardial infarction involving other coronary artery of inferior wall: Secondary | ICD-10-CM | POA: Diagnosis not present

## 2023-11-25 LAB — COOXEMETRY PANEL
Carboxyhemoglobin: 1.1 % (ref 0.5–1.5)
Methemoglobin: <0.7 % (ref 0.0–1.5)
O2 Saturation: 72.8 %
Total hemoglobin: 11.1 g/dL — ABNORMAL LOW (ref 12.0–16.0)

## 2023-11-25 MED ORDER — SPIRONOLACTONE 12.5 MG HALF TABLET
12.5000 mg | ORAL_TABLET | Freq: Every day | ORAL | Status: DC
  Administered 2023-11-25 – 2023-11-26 (×2): 12.5 mg via ORAL
  Filled 2023-11-25 (×2): qty 1

## 2023-11-25 MED ORDER — SODIUM CHLORIDE 0.9% FLUSH: 10.0000 mL | INTRAVENOUS | Status: DC | PRN

## 2023-11-25 MED ORDER — SODIUM CHLORIDE 0.9% FLUSH
10.0000 mL | Freq: Two times a day (BID) | INTRAVENOUS | Status: DC
  Administered 2023-11-25: 20 mL
  Administered 2023-11-26 – 2023-11-27 (×4): 10 mL
  Administered 2023-11-28: 20 mL
  Administered 2023-11-28 – 2023-12-04 (×12): 10 mL
  Administered 2023-12-05: 30 mL

## 2023-11-25 NOTE — Evaluation (Signed)
 Physical Therapy Evaluation Patient Details Name: Ricky Bailey MRN: [id number] DOB: 01/29/1956 Today's Date: 11/25/2023  History of Present Illness  68 y.o. male presents to [facility] 11/20/23 due to chest pain, code STEMI. Emergent PCI which was unsuccessful 2/2 occluded large RCA. 5/15 progressive R HF, ECG changes, and worsening hypotension w/ urgent R/L heart cath. Pt with new ruptured plaque in LAD, s/p stent. PMHx: HTN, obesity   Clinical Impression  Pt in recliner upon arrival with family present and agreeable to PT eval. PTA, pt was independent for mobility with no AD. In today's session, pt was able to stand and ambulate 25ft with MinA x2 for line management. Pt was slightly unsteady with no AD and would reach for support from bed rail when available. Discussed trial of AD in future sessions to improve stability. Pt has 24/7 assist available upon return home. Pt currently with functional limitations due to the deficits listed below (see PT Problem List). Pt would benefit from acute skilled PT to address functional impairments. Recommending post-acute HHPT with potential to have no post-acute PT needs pending progress. Acute PT to follow.         If plan is discharge home, recommend the following: A little help with walking and/or transfers;A little help with bathing/dressing/bathroom;Assistance with cooking/housework;Assist for transportation;Help with stairs or ramp for entrance   Can travel by private vehicle    Yes    Equipment Recommendations  (TBA)     Functional Status Assessment Patient has had a recent decline in their functional status and demonstrates the ability to make significant improvements in function in a reasonable and predictable amount of time.     Precautions / Restrictions Precautions Precautions: Fall Recall of Precautions/Restrictions: Intact Precaution/Restrictions Comments: Swan-ganz, multiple IVs Restrictions Weight Bearing Restrictions Per Provider Order:  No      Mobility  Bed Mobility Overal bed mobility: Needs Assistance Bed Mobility: Sit to Supine     Sit to supine: Min assist   General bed mobility comments: MinA for line management and slight raise of LE's    Transfers Overall transfer level: Needs assistance Equipment used: 1 person hand held assist Transfers: Sit to/from Stand, Bed to chair/wheelchair/BSC Sit to Stand: Min assist   Step pivot transfers: Min assist     General transfer comment: MinA for line management,    Ambulation/Gait Ambulation/Gait assistance: Min assist, +2 safety/equipment Gait Distance (Feet): 12 Feet Assistive device: None Gait Pattern/deviations: Step-through pattern, Decreased stride length Gait velocity: decr     General Gait Details: slightly unsteady with increased medial/lateral sway. Pt reaching for bed rail for support    Balance Overall balance assessment: Needs assistance Sitting-balance support: Feet supported Sitting balance-Leahy Scale: Good     Standing balance support: Single extremity supported Standing balance-Leahy Scale: Fair        Pertinent Vitals/Pain Pain Assessment Pain Assessment: Faces Faces Pain Scale: Hurts little more Pain Location: R side of neck where swan enters Pain Descriptors / Indicators: Tender Pain Intervention(s): Limited activity within patient's tolerance, Monitored during session, Repositioned    Home Living Family/patient expects to be discharged to:: Private residence Living Arrangements: Alone Available Help at Discharge: Family;Available 24 hours/day Type of Home: Apartment Home Access: Stairs to enter Entrance Stairs-Rails: None Entrance Stairs-Number of Steps: 2   Home Layout: One level Home Equipment: None      Prior Function Prior Level of Function : Independent/Modified Independent;Driving    Mobility Comments: Ind with no AD ADLs Comments: Ind with ADls and  iADLs     Extremity/Trunk Assessment   Upper  Extremity Assessment Upper Extremity Assessment: Defer to OT evaluation    Lower Extremity Assessment Lower Extremity Assessment: Overall WFL for tasks assessed    Cervical / Trunk Assessment Cervical / Trunk Assessment: Normal  Communication   Communication Communication: No apparent difficulties    Cognition Arousal: Alert Behavior During Therapy: WFL for tasks assessed/performed   PT - Cognitive impairments: No apparent impairments      Following commands: Intact       Cueing Cueing Techniques: Verbal cues      PT Assessment Patient needs continued PT services  PT Problem List Decreased activity tolerance;Decreased balance;Decreased mobility       PT Treatment Interventions DME instruction;Gait training;Stair training;Functional mobility training;Therapeutic activities;Therapeutic exercise;Balance training;Neuromuscular re-education;Patient/family education    PT Goals (Current goals can be found in the Care Plan section)  Acute Rehab PT Goals Patient Stated Goal: to get better and go home PT Goal Formulation: With patient/family Time For Goal Achievement: 12/09/23 Potential to Achieve Goals: Good    Frequency Min 2X/week     Co-evaluation   Reason for Co-Treatment: Complexity of the patient's impairments (multi-system involvement) PT goals addressed during session: Mobility/safety with mobility;Balance OT goals addressed during session: ADL's and self-care       AM-PAC PT "6 Clicks" Mobility  Outcome Measure Help needed turning from your back to your side while in a flat bed without using bedrails?: A Little Help needed moving from lying on your back to sitting on the side of a flat bed without using bedrails?: A Little Help needed moving to and from a bed to a chair (including a wheelchair)?: A Little Help needed standing up from a chair using your arms (e.g., wheelchair or bedside chair)?: A Little Help needed to walk in hospital room?: A  Little Help needed climbing 3-5 steps with a railing? : A Lot 6 Click Score: 17    End of Session Equipment Utilized During Treatment: Oxygen Activity Tolerance: Patient tolerated treatment well Patient left: in bed;with call bell/phone within reach;with family/visitor present;with nursing/sitter in room Nurse Communication: Mobility status (RN present during session) PT Visit Diagnosis: Unsteadiness on feet (R26.81);Other abnormalities of gait and mobility (R26.89)    Time: 1324-4010 PT Time Calculation (min) (ACUTE ONLY): 23 min   Charges:   PT Evaluation $PT Eval Low Complexity: 1 Low   PT General Charges $$ ACUTE PT VISIT: 1 Visit        Orysia Blas, PT, DPT Secure Chat Preferred  Rehab Office [phone]   Alissa April Adela Ades 11/25/2023, 12:02 PM

## 2023-11-25 NOTE — Progress Notes (Signed)
 Peripherally Inserted Central Catheter Placement  The IV Nurse has discussed with the patient and/or persons authorized to consent for the patient, the purpose of this procedure and the potential benefits and risks involved with this procedure.  The benefits include less needle sticks, lab draws from the catheter, and the patient may be discharged home with the catheter. Risks include, but not limited to, infection, bleeding, blood clot (thrombus formation), and puncture of an artery; nerve damage and irregular heartbeat and possibility to perform a PICC exchange if needed/ordered by physician.  Alternatives to this procedure were also discussed.  Bard Power PICC patient education guide, fact sheet on infection prevention and patient information card has been provided to patient /or left at bedside.    PICC Placement Documentation  PICC Double Lumen 11/25/23 Right Brachial 45 cm 1 cm (Active)  Indication for Insertion or Continuance of Line Vasoactive infusions;Administration of hyperosmolar/irritating solutions (i.e. TPN, Vancomycin, etc.);Chronic illness with exacerbations (CF, Sickle Cell, etc.) 11/25/23 1700  Exposed Catheter (cm) 1 cm 11/25/23 1700  Site Assessment Clean, Dry, Intact 11/25/23 1700  Lumen #1 Status Flushed;Saline locked;Blood return noted 11/25/23 1700  Lumen #2 Status Flushed;Saline locked;Blood return noted 11/25/23 1700  Dressing Type Transparent;Securing device 11/25/23 1700  Dressing Status Antimicrobial disc/dressing in place;Clean, Dry, Intact 11/25/23 1700  Line Care Connections checked and tightened 11/25/23 1700  Line Adjustment (NICU/IV Team Only) No 11/25/23 1700  Dressing Intervention New dressing;Adhesive placed at insertion site (IV team only);Adhesive placed around edges of dressing (IV team/ICU RN only) 11/25/23 1700  Dressing Change Due 12/02/23 11/25/23 1700       Allegra Arch 11/25/2023, 5:35 PM

## 2023-11-25 NOTE — Evaluation (Signed)
 Occupational Therapy Evaluation Patient Details Name: Ricky Bailey MRN: [id number] DOB: 1956-03-02 Today's Date: 11/25/2023   History of Present Illness   68 y.o. male presents to [facility] 11/20/23 due to chest pain, code STEMI. Emergent PCI which was unsuccessful 2/2 occluded large RCA. 5/15 progressive R HF, ECG changes, and worsening hypotension w/ urgent R/L heart cath. Pt with new ruptured plaque in LAD, s/p stent. PMHx: HTN, obesity     Clinical Impressions Patient admitted for the diagnosis above.  PTA he lives alone, but will have assist as needed from family.  Deficits impacting function are listed below.  Currently he is needing up to Mod A for ADL completion, mainly due to line and leeds, and generalized Min A for mobility.  OT will continue efforts in the acute setting, and patient expects to discharge home, no post acute OT is anticipated.       If plan is discharge home, recommend the following:   Assist for transportation     Functional Status Assessment   Patient has had a recent decline in their functional status and demonstrates the ability to make significant improvements in function in a reasonable and predictable amount of time.     Equipment Recommendations   Tub/shower seat     Recommendations for Other Services         Precautions/Restrictions   Precautions Precautions: Fall Recall of Precautions/Restrictions: Intact Precaution/Restrictions Comments: Swan-neck, multiple lines Restrictions Weight Bearing Restrictions Per Provider Order: No     Mobility Bed Mobility Overal bed mobility: Needs Assistance Bed Mobility: Sit to Supine       Sit to supine: Min assist     Patient Response: Cooperative  Transfers Overall transfer level: Needs assistance Equipment used: 1 person hand held assist Transfers: Sit to/from Stand, Bed to chair/wheelchair/BSC Sit to Stand: Min assist     Step pivot transfers: Min assist            Balance  Overall balance assessment: Needs assistance Sitting-balance support: Feet supported Sitting balance-Leahy Scale: Good     Standing balance support: Single extremity supported Standing balance-Leahy Scale: Fair                             ADL either performed or assessed with clinical judgement   ADL Overall ADL's : Needs assistance/impaired     Grooming: Wash/dry hands;Wash/dry face;Set up;Sitting   Upper Body Bathing: Minimal assistance;Sitting   Lower Body Bathing: Minimal assistance;Sit to/from stand   Upper Body Dressing : Moderate assistance;Sitting   Lower Body Dressing: Moderate assistance;Sit to/from stand   Toilet Transfer: Minimal Holiday representative;Ambulation                   Vision Patient Visual Report: No change from baseline       Perception Perception: Not tested       Praxis Praxis: Not tested       Pertinent Vitals/Pain Pain Assessment Pain Assessment: Faces Faces Pain Scale: Hurts little more Pain Location: R side of neck where swan enters Pain Descriptors / Indicators: Tender Pain Intervention(s): Monitored during session     Extremity/Trunk Assessment Upper Extremity Assessment Upper Extremity Assessment: Overall WFL for tasks assessed   Lower Extremity Assessment Lower Extremity Assessment: Defer to PT evaluation   Cervical / Trunk Assessment Cervical / Trunk Assessment: Normal   Communication Communication Communication: No apparent difficulties   Cognition Arousal: Alert Behavior During Therapy: [facility] for tasks assessed/performed  Cognition: No apparent impairments                               Following commands: Intact       Cueing  General Comments   Cueing Techniques: Verbal cues   VSS on supplemental O2   Exercises     Shoulder Instructions      Home Living Family/patient expects to be discharged to:: Private residence Living Arrangements: Alone Available Help at  Discharge: Family;Available 24 hours/day Type of Home: Apartment Home Access: Stairs to enter Entrance Stairs-Number of Steps: (P) 2 Entrance Stairs-Rails: (P) None Home Layout: One level     Bathroom Shower/Tub: Chief Strategy Officer: Standard Bathroom Accessibility: Yes How Accessible: Accessible via walker Home Equipment: None          Prior Functioning/Environment Prior Level of Function : Independent/Modified Independent;Driving             Mobility Comments: (P) Ind with no AD ADLs Comments: (P) Ind with ADls and iADLs    OT Problem List: Decreased activity tolerance;Impaired balance (sitting and/or standing)   OT Treatment/Interventions: Self-care/ADL training;Therapeutic activities;Energy conservation;DME and/or AE instruction;Patient/family education;Balance training      OT Goals(Current goals can be found in the care plan section)   Acute Rehab OT Goals Patient Stated Goal: Return home OT Goal Formulation: With patient Time For Goal Achievement: 12/10/23 Potential to Achieve Goals: Good ADL Goals Pt Will Perform Grooming: with modified independence;standing Pt Will Perform Lower Body Dressing: with modified independence;sit to/from stand Pt Will Transfer to Toilet: with modified independence;regular height toilet;ambulating   OT Frequency:  Min 2X/week    Co-evaluation PT/OT/SLP Co-Evaluation/Treatment: Yes Reason for Co-Treatment: Complexity of the patient's impairments (multi-system involvement)   OT goals addressed during session: ADL's and self-care      AM-PAC OT "6 Clicks" Daily Activity     Outcome Measure Help from another person eating meals?: None Help from another person taking care of personal grooming?: A Little Help from another person toileting, which includes using toliet, bedpan, or urinal?: A Little Help from another person bathing (including washing, rinsing, drying)?: A Lot Help from another person to put on  and taking off regular upper body clothing?: A Lot Help from another person to put on and taking off regular lower body clothing?: A Lot 6 Click Score: 16   End of Session Equipment Utilized During Treatment: Oxygen Nurse Communication: Mobility status  Activity Tolerance: Patient tolerated treatment well Patient left: in bed;with call bell/phone within reach;with family/visitor present  OT Visit Diagnosis: Unsteadiness on feet (R26.81)                Time: 1191-4782 OT Time Calculation (min): 24 min Charges:  OT General Charges $OT Visit: 1 Visit OT Evaluation $OT Eval Moderate Complexity: 1 Mod  11/25/2023  RP, OTR/L  Acute Rehabilitation Services  Office:  [phone]   Ricky Bailey 11/25/2023, 11:09 AM

## 2023-11-25 NOTE — Progress Notes (Signed)
 Advanced Heart Failure Rounding Note  Cardiologist: None  Chief Complaint: STEMI Subjective:    68 y/o male admitted with RCA STEMI and VF arrest. Unable to open RCA. Subsequently developed recurrent CP and ECG changes. Re-look cath 5/16 with new ruptured plaque in LAD -> stent. RHC with evidence for severe RV failure/shock  Echo EF 35-40% RV moderately HK  Now on DBA 5. Now off NE  Co-ox 73%   Denies CP or SOB   PAP: (12-43)/(4-22) 28/14 CVP:  [1 mmHg-17 mmHg] 7 mmHg PCWP:  [14 mmHg] 14 mmHg CO:  [4.3 L/min-6.6 L/min] 5 L/min CI:  [1.82 L/min/m2-2.8 L/min/m2] 2.1 L/min/m2 PAPI 2.0   Objective:    Weight Range: 113.8 kg Body mass index is 34.99 kg/m.   Vital Signs:   Temp:  [99.3 F (37.4 C)-101.1 F (38.4 C)] 100.2 F (37.9 C) (05/18 1300) Pulse Rate:  [92-105] 95 (05/18 1300) Resp:  [6-46] 17 (05/18 1300) BP: (82-144)/(52-94) 122/84 (05/18 1300) SpO2:  [91 %-98 %] 94 % (05/18 1300) Weight:  [113.8 kg] 113.8 kg (05/18 0645) Last BM Date : 11/25/23  Weight change: Filed Weights   11/20/23 1326 11/21/23 0500 11/25/23 0645  Weight: 122 kg 119.8 kg 113.8 kg   Intake/Output:  Intake/Output Summary (Last 24 hours) at 11/25/2023 1316 Last data filed at 11/25/2023 1300 Gross per 24 hour  Intake 834.16 ml  Output 1400 ml  Net -565.84 ml   Physical Exam    General:  Lying in bed No resp difficulty HEENT: normal Neck: RIJ swan  supple. JVP 7  Cor: PMI nondisplaced. Regular rate & rhythm. No rubs, gallops or murmurs. Lungs: clear Abdomen: soft, nontender, nondistended. No hepatosplenomegaly. No bruits or masses. Good bowel sounds. Extremities: no cyanosis, clubbing, rash, edema Neuro: alert & orientedx3, cranial nerves grossly intact. moves all 4 extremities w/o difficulty. Affect pleasant   Telemetry   Sinus  90-100  Personally reviewed  Labs    CBC Recent Labs    11/23/23 1101 11/24/23 0511  WBC  --  12.2*  HGB 11.9* 11.6*  HCT 35.0* 35.2*   MCV  --  91.2  PLT  --  173   Basic Metabolic Panel Recent Labs    29/52/84 0257 11/23/23 1052 11/23/23 1101 11/23/23 1820 11/24/23 0511  NA 134*   < > 132*  --  130*  K 3.9   < > 4.2  --  3.9  CL 101  --   --   --  100  CO2 24  --   --   --  21*  GLUCOSE 103*  --   --   --  127*  BUN 19  --   --   --  27*  CREATININE 1.27*  --   --   --  1.27*  CALCIUM  8.3*  --   --   --  7.8*  MG  --   --   --  2.2 2.4   < > = values in this interval not displayed.   Liver Function Tests No results for input(s): "AST", "ALT", "ALKPHOS", "BILITOT", "PROT", "ALBUMIN" in the last 72 hours.  Hemoglobin A1C No results for input(s): "HGBA1C" in the last 72 hours.  Fasting Lipid Panel No results for input(s): "CHOL", "HDL", "LDLCALC", "TRIG", "CHOLHDL", "LDLDIRECT" in the last 72 hours.  Imaging   No results found.  Medications:    Scheduled Medications:  allopurinol   150 mg Oral Daily   aspirin  EC  81 mg  Oral Daily   Chlorhexidine  Gluconate Cloth  6 each Topical Daily   colchicine   0.6 mg Oral Daily   digoxin  0.125 mg Oral Daily   finasteride   5 mg Oral Daily   heparin   5,000 Units Subcutaneous Q8H   polyethylene glycol  17 g Oral Daily   prasugrel   10 mg Oral Daily   rosuvastatin   40 mg Oral Daily   senna-docusate  2 tablet Oral QHS   Infusions:  sodium chloride  1 mL/hr at 11/25/23 1300   sodium chloride  1 mL/hr at 11/25/23 1300   sodium chloride  10 mL/hr at 11/25/23 1300   DOBUTamine  5 mcg/kg/min (11/25/23 1300)   norepinephrine  (LEVOPHED ) Adult infusion Stopped (11/25/23 1135)   PRN Medications: sodium chloride , acetaminophen , fentaNYL  (SUBLIMAZE ) injection, magnesium  hydroxide, morphine  injection, ondansetron  (ZOFRAN ) IV, mouth rinse, oxyCODONE , traZODone   Patient Profile   Ricky Bailey is a 68 y.o. male with depression, HTN, HLD, obesity, BPH, and OSA. Admitted with STEMI  Assessment/Plan   CAD with acute inferior STEMI and RV infarct - LHC 5/13: thrombotic  occlusion of larg caliber RCA s/p balloon angio and aspiration thrombectomy. Minimal disease in LAD and LCX. C/B VF arrest in cath lab. - Repeat LHC 5/14: unsuccessful PCI. 99% residual stenosis  - Recurrent CP and ECG changes on 5/16 -> cath with 99% LAD stenosis due to new ruptured plaque -> DES  RHC with severe RV failure shock - No s/s angina - CR consult.  - Mobilize  2. Cardiogenic shock due to STEMI and RV infarct - echo 11/22/23 EF 60-65 - echo 5/16 EF 35-40% RV moderately down - Now on DBA 5 and off NE Co-ox 73%   - Swan #s c/w RV infarct but improving. PAPI up to 2 - Begin DBA wean tomorrow - Continue digoxin - Add spiro 12.5 - Pull swan. Place PICC  3. VF arrest - in the setting of acute STEMI and failed revasc attempts - Amio stopped 5/17  4. Ascending Aortic Aneurysm - measuring 4.2 mm - stable  CRITICAL CARE Performed by: Jules Oar  Total critical care time: 42 minutes  Critical care time was exclusive of separately billable procedures and treating other patients.  Critical care was necessary to treat or prevent imminent or life-threatening deterioration.  Critical care was time spent personally by me (independent of midlevel providers or residents) on the following activities: development of treatment plan with patient and/or surrogate as well as nursing, discussions with consultants, evaluation of patient's response to treatment, examination of patient, obtaining history from patient or surrogate, ordering and performing treatments and interventions, ordering and review of laboratory studies, ordering and review of radiographic studies, pulse oximetry and re-evaluation of patient's condition.   Length of Stay: 5  Jules Oar, MD  11/25/2023, 1:16 PM  Advanced Heart Failure Team Pager [phone] (M-F; 7a - 5p)  Please contact [facility] for night-coverage after hours (5p -7a ) and weekends on [email or web address]

## 2023-11-26 ENCOUNTER — Encounter (HOSPITAL_COMMUNITY): Payer: Self-pay | Admitting: Cardiovascular Disease

## 2023-11-26 DIAGNOSIS — I2119 ST elevation (STEMI) myocardial infarction involving other coronary artery of inferior wall: Secondary | ICD-10-CM | POA: Diagnosis not present

## 2023-11-26 LAB — BASIC METABOLIC PANEL WITH GFR
Anion gap: 10 (ref 5–15)
Anion gap: 9 (ref 5–15)
BUN: 31 mg/dL — ABNORMAL HIGH (ref 8–23)
BUN: 29 mg/dL — ABNORMAL HIGH (ref 8–23)
CO2: 23 mmol/L (ref 22–32)
CO2: 23 mmol/L (ref 22–32)
Calcium: 8.4 mg/dL — ABNORMAL LOW (ref 8.9–10.3)
Calcium: 8.3 mg/dL — ABNORMAL LOW (ref 8.9–10.3)
Chloride: 101 mmol/L (ref 98–111)
Chloride: 101 mmol/L (ref 98–111)
Creatinine, Ser: 1.63 mg/dL — ABNORMAL HIGH (ref 0.61–1.24)
Creatinine, Ser: 1.63 mg/dL — ABNORMAL HIGH (ref 0.61–1.24)
GFR, Estimated: 46 mL/min — ABNORMAL LOW (ref >60)
GFR, Estimated: 46 mL/min — ABNORMAL LOW (ref >60)
Glucose, Bld: 100 mg/dL — ABNORMAL HIGH (ref 70–99)
Glucose, Bld: 153 mg/dL — ABNORMAL HIGH (ref 70–99)
Potassium: 3.9 mmol/L (ref 3.5–5.1)
Potassium: 4.1 mmol/L (ref 3.5–5.1)
Sodium: 134 mmol/L — ABNORMAL LOW (ref 135–145)
Sodium: 133 mmol/L — ABNORMAL LOW (ref 135–145)

## 2023-11-26 LAB — CBC
HCT: 31.1 % — ABNORMAL LOW (ref 39.0–52.0)
Hemoglobin: 10.4 g/dL — ABNORMAL LOW (ref 13.0–17.0)
MCH: 31 pg (ref 26.0–34.0)
MCHC: 33.4 g/dL (ref 30.0–36.0)
MCV: 92.6 fL (ref 80.0–100.0)
Platelets: 205 10*3/uL (ref 150–400)
RBC: 3.36 MIL/uL — ABNORMAL LOW (ref 4.22–5.81)
RDW: 13.6 % (ref 11.5–15.5)
WBC: 8.1 10*3/uL (ref 4.0–10.5)
nRBC: 0 % (ref 0.0–0.2)

## 2023-11-26 LAB — COOXEMETRY PANEL
Carboxyhemoglobin: 1.4 % (ref 0.5–1.5)
Carboxyhemoglobin: 1.5 % (ref 0.5–1.5)
Methemoglobin: <0.7 % (ref 0.0–1.5)
Methemoglobin: <0.7 % (ref 0.0–1.5)
O2 Saturation: 67.6 %
O2 Saturation: 65.3 %
Total hemoglobin: 11.8 g/dL — ABNORMAL LOW (ref 12.0–16.0)
Total hemoglobin: 11.2 g/dL — ABNORMAL LOW (ref 12.0–16.0)

## 2023-11-26 MED ORDER — OXYCODONE HCL 5 MG PO TABS
5.0000 mg | ORAL_TABLET | Freq: Four times a day (QID) | ORAL | Status: DC | PRN
  Administered 2023-12-02: 10 mg via ORAL
  Filled 2023-11-26: qty 2

## 2023-11-26 MED ORDER — SPIRONOLACTONE 25 MG PO TABS: 25.0000 mg | ORAL_TABLET | Freq: Every day | ORAL | Status: DC

## 2023-11-26 MED ORDER — SPIRONOLACTONE 12.5 MG HALF TABLET: 12.5000 mg | ORAL_TABLET | Freq: Once | ORAL | Status: DC

## 2023-11-26 MED ORDER — SPIRONOLACTONE 12.5 MG HALF TABLET: 12.5000 mg | ORAL_TABLET | Freq: Every day | ORAL | Status: DC

## 2023-11-26 MED FILL — Verapamil HCl IV Soln 2.5 MG/ML: INTRAVENOUS | Qty: 2 | Status: AC

## 2023-11-26 NOTE — Plan of Care (Signed)
  Problem: Clinical Measurements: Goal: Diagnostic test results will improve Outcome: Progressing Goal: Cardiovascular complication will be avoided Outcome: Progressing   Problem: Activity: Goal: Risk for activity intolerance will decrease Outcome: Progressing   

## 2023-11-26 NOTE — Progress Notes (Signed)
 Advanced Heart Failure Rounding Note  Cardiologist: None   Chief Complaint: STEMI  Subjective:    68 y/o male admitted with RCA STEMI and VF arrest. Unable to open RCA. Subsequently developed recurrent CP and ECG changes. Re-look cath 5/16 with new ruptured plaque in LAD -> stent. RHC with evidence for severe RV failure/shock  Echo EF 35-40% RV moderately HK  Remains on 5 DBA  Swan #s CVP waveform flat PA 22/11 TD CO 7.4 TD CI 3.12 CO-OX 67% w/ Fick CI of 3  Fatigued but otherwise feeling well. No chest pain or dyspnea.    Objective:    Weight Range: 121 kg Body mass index is 37.21 kg/m.   Vital Signs:   Temp:  [99.1 F (37.3 C)-100.8 F (38.2 C)] 99.1 F (37.3 C) (05/19 0600) Pulse Rate:  [79-98] 88 (05/19 0600) Resp:  [6-37] 27 (05/19 0600) BP: (94-130)/(64-94) 116/79 (05/19 0600) SpO2:  [82 %-99 %] 97 % (05/19 0600) Weight:  [121 kg] 121 kg (05/19 0700) Last BM Date : 11/25/23  Weight change: Filed Weights   11/21/23 0500 11/25/23 0645 11/26/23 0700  Weight: 119.8 kg 118.3 kg 121 kg   Intake/Output:  Intake/Output Summary (Last 24 hours) at 11/26/2023 0722 Last data filed at 11/26/2023 0600 Gross per 24 hour  Intake 692.23 ml  Output 400 ml  Net 292.23 ml   Physical Exam    General:  Lying in bed. Neck: R internal jugular Swan. No JVD Cor: Regular rate & rhythm. No rubs, gallops or murmurs. Lungs: clear Abdomen: soft, nontender, nondistended.  Extremities: no edema, R brachial PICC Neuro: alert & orientedx3. Affect pleasant    Telemetry   SR/ST 90s-100s, PVCs, 3-4 beat runs NSVT  Labs    CBC Recent Labs    11/24/23 0511 11/26/23 0053  WBC 12.2* 8.1  HGB 11.6* 10.4*  HCT 35.2* 31.1*  MCV 91.2 92.6  PLT 173 205   Basic Metabolic Panel Recent Labs    30/16/01 1820 11/24/23 0511 11/26/23 0053  NA  --  130* 134*  K  --  3.9 3.9  CL  --  100 101  CO2  --  21* 23  GLUCOSE  --  127* 100*  BUN  --  27* 31*  CREATININE  --   1.27* 1.63*  CALCIUM   --  7.8* 8.4*  MG 2.2 2.4  --    Liver Function Tests No results for input(s): "AST", "ALT", "ALKPHOS", "BILITOT", "PROT", "ALBUMIN" in the last 72 hours.  Hemoglobin A1C No results for input(s): "HGBA1C" in the last 72 hours.  Fasting Lipid Panel No results for input(s): "CHOL", "HDL", "LDLCALC", "TRIG", "CHOLHDL", "LDLDIRECT" in the last 72 hours.  Imaging   US  EKG SITE RITE Result Date: 11/25/2023 If Site Rite image not attached, placement could not be confirmed due to current cardiac rhythm.   Medications:    Scheduled Medications:  allopurinol   150 mg Oral Daily   aspirin  EC  81 mg Oral Daily   Chlorhexidine  Gluconate Cloth  6 each Topical Daily   colchicine   0.6 mg Oral Daily   digoxin   0.125 mg Oral Daily   finasteride   5 mg Oral Daily   heparin   5,000 Units Subcutaneous Q8H   polyethylene glycol  17 g Oral Daily   prasugrel   10 mg Oral Daily   rosuvastatin   40 mg Oral Daily   senna-docusate  2 tablet Oral QHS   sodium chloride  flush  10-40 mL Intracatheter Q12H  spironolactone   12.5 mg Oral Daily   Infusions:  sodium chloride  1 mL/hr at 11/26/23 0600   DOBUTamine  5 mcg/kg/min (11/26/23 0600)   PRN Medications: sodium chloride , acetaminophen , fentaNYL  (SUBLIMAZE ) injection, magnesium  hydroxide, morphine  injection, ondansetron  (ZOFRAN ) IV, mouth rinse, oxyCODONE , sodium chloride  flush, traZODone   Patient Profile   Ricky Bailey is a 68 y.o. male with depression, HTN, HLD, obesity, BPH, and OSA. Admitted with STEMI  Assessment/Plan   CAD with acute inferior STEMI and RV infarct - LHC 5/13: thrombotic occlusion of larg caliber RCA s/p balloon angio and aspiration thrombectomy. Minimal disease in LAD and LCX. C/B VF arrest in cath lab. - Repeat LHC 5/14: unsuccessful PCI. 99% residual stenosis  - Recurrent CP and ECG changes on 5/16 -> cath with 99% LAD stenosis due to new ruptured plaque -> DES  RHC with severe RV failure shock - DAPT  with aspirin  + effient . Continue 40 mg crestor  daily - No angina - CR consult.  - Continue to mobilize  2. Cardiogenic shock due to STEMI and RV infarct - echo 11/22/23 EF 60-65 - echo 5/16 EF 35-40% RV moderately down - Now on DBA 5 and off NE. Fick and TD CI around 3. - Has PICC. Remove Swan and introducer.  - Wean DBA to 2.5 mcg/kg/min and check CO-OX this afternoon - Continue digoxin  - Continue spiro 12.5 mg daily, increase tomorrow if Scr improving  3. VF arrest PVCs/NSVT - in the setting of acute STEMI and failed revasc attempts - Amio stopped 5/17 - Continues to have frequent PVCs and short runs NSVT on telemetry. ? If may need LifeVest if this persists once off DBA.  4. Ascending Aortic Aneurysm - measuring 4.2 mm - stable  5. AKI  - Scr 1.27>1.6 - BUN trending up  - Liberalize fluid intake today - BMET this afternoon  6. Fever - Noted yesterday afternoon, Tmax 100.6 F - BC X 2 with no growth so far, had mild leukocytosis post MI which has resolved.  - Monitor for now. Afebrile this morning  CRITICAL CARE Performed by: Gerilyn Kobus N   Total critical care time: 15 minutes  Critical care time was exclusive of separately billable procedures and treating other patients.  Critical care was necessary to treat or prevent imminent or life-threatening deterioration.  Critical care was time spent personally by me on the following activities: development of treatment plan with patient and/or surrogate as well as nursing, discussions with consultants, evaluation of patient's response to treatment, examination of patient, obtaining history from patient or surrogate, ordering and performing treatments and interventions, ordering and review of laboratory studies, ordering and review of radiographic studies, pulse oximetry and re-evaluation of patient's condition.    Length of Stay: 6  Jazminn Pomales N, PA-C  11/26/2023, 7:22 AM  Advanced Heart Failure Team Pager  [phone] (M-F; 7a - 5p)  Please contact [facility] for night-coverage after hours (5p -7a ) and weekends on [email or web address]

## 2023-11-27 ENCOUNTER — Inpatient Hospital Stay (HOSPITAL_COMMUNITY)

## 2023-11-27 DIAGNOSIS — I509 Heart failure, unspecified: Secondary | ICD-10-CM | POA: Diagnosis not present

## 2023-11-27 DIAGNOSIS — I2119 ST elevation (STEMI) myocardial infarction involving other coronary artery of inferior wall: Secondary | ICD-10-CM | POA: Diagnosis not present

## 2023-11-27 LAB — COOXEMETRY PANEL
Carboxyhemoglobin: 1.5 % (ref 0.5–1.5)
Methemoglobin: <0.7 % (ref 0.0–1.5)
O2 Saturation: 61.6 %
Total hemoglobin: 11.7 g/dL — ABNORMAL LOW (ref 12.0–16.0)

## 2023-11-27 LAB — BASIC METABOLIC PANEL WITH GFR
Anion gap: 10 (ref 5–15)
BUN: 31 mg/dL — ABNORMAL HIGH (ref 8–23)
CO2: 22 mmol/L (ref 22–32)
Calcium: 8.7 mg/dL — ABNORMAL LOW (ref 8.9–10.3)
Chloride: 103 mmol/L (ref 98–111)
Creatinine, Ser: 1.49 mg/dL — ABNORMAL HIGH (ref 0.61–1.24)
GFR, Estimated: 51 mL/min — ABNORMAL LOW (ref >60)
Glucose, Bld: 106 mg/dL — ABNORMAL HIGH (ref 70–99)
Potassium: 4.2 mmol/L (ref 3.5–5.1)
Sodium: 135 mmol/L (ref 135–145)

## 2023-11-27 LAB — ECHOCARDIOGRAM LIMITED
Height: 71 in
S' Lateral: 3.9 cm
Weight: 4268.11 [oz_av]

## 2023-11-27 MED ORDER — SPIRONOLACTONE 25 MG PO TABS
25.0000 mg | ORAL_TABLET | Freq: Every day | ORAL | Status: DC
  Administered 2023-11-27 – 2023-12-05 (×9): 25 mg via ORAL
  Filled 2023-11-27 (×9): qty 1

## 2023-11-27 MED ORDER — PERFLUTREN LIPID MICROSPHERE
1.0000 mL | INTRAVENOUS | Status: AC | PRN
  Administered 2023-11-27: 3 mL via INTRAVENOUS

## 2023-11-27 MED ORDER — LOSARTAN POTASSIUM 25 MG PO TABS
25.0000 mg | ORAL_TABLET | Freq: Every day | ORAL | Status: DC
  Administered 2023-11-27 – 2023-12-05 (×9): 25 mg via ORAL
  Filled 2023-11-27 (×9): qty 1

## 2023-11-27 NOTE — Progress Notes (Signed)
 This chaplain responded to RN-Jessica's page for assistance with uploading the Pt. completed Advance Directive in EMR, after an outside notary assisted the Pt. with notary services.  Chaplain Kathleene Papas [phone]

## 2023-11-27 NOTE — TOC Progression Note (Signed)
 Transition of Care [facility]) - Progression Note    Patient Details  Name: Ricky Bailey MRN: [id number] Date of Birth: [birth year]  Transition of Care [facility]) CM/SW Contact  Benjiman Bras, RN Phone Number: [phone] 11/27/2023, 8:39 AM  Clinical Narrative:    TOC CM spoke to pt and son at bedside. Pt states he has an older CPAP at home. Discussed HHPT and RW. States he wants to hold off on RW, but interested in HHPT.  Offered choice for [facility]. Agreeable to agency that will accept his plan.  Will need HH PT orders with F2F.  States he has scale at home. Discussed DASH diet/heart healthy diet.  Will continue to follow for dc needs.    Expected Discharge Plan: Home w Home Health Services Barriers to Discharge: Continued Medical Work up  Expected Discharge Plan and Services   Discharge Planning Services: CM Consult Post Acute Care Choice: Home Health Living arrangements for the past 2 months: Apartment                                       Social Determinants of Health (SDOH) Interventions SDOH Screenings   Food Insecurity: No Food Insecurity (11/20/2023)  Housing: Low Risk  (11/20/2023)  Transportation Needs: No Transportation Needs (11/20/2023)  Utilities: Not At Risk (11/20/2023)  Alcohol Screen: Medium Risk (02/09/2023)  Social Connections: Moderately Isolated (11/20/2023)  Tobacco Use: Medium Risk (11/20/2023)    Readmission Risk Interventions     No data to display

## 2023-11-27 NOTE — Progress Notes (Signed)
 Physical Therapy Treatment Patient Details Name: Ricky Bailey MRN: [id number] DOB: 1956/02/05 Today's Date: 11/27/2023   History of Present Illness 68 y.o. male [facility] to [facility]/20/23 due to chest pain, code STEMI. Emergent PCI which was unsuccessful 2/2 occluded large RCA. 5/15 progressive R HF, ECG changes, and worsening hypotension w/ urgent R/L heart cath. Pt with new ruptured plaque in LAD, s/p stent. PMHx: HTN, obesity    PT Comments  Pt with flat affect and depressed spirits however cooperative with PT. Pt amb 270' with RW however required 4 standing rest breaks due to SOB. Pt with increased bilat UE support vs PTA level of function of independence without AD. Pt with generalized weakness and decreased activity tolerance. Recommend HH services to address above deficits and progress back to indep function. Acute PT to cont to follow.    If plan is discharge home, recommend the following: A little help with walking and/or transfers;A little help with bathing/dressing/bathroom;Assistance with cooking/housework;Assist for transportation;Help with stairs or ramp for entrance   Can travel by private vehicle        Equipment Recommendations   (TBA)    Recommendations for Other Services       Precautions / Restrictions Precautions Precautions: Fall Recall of Precautions/Restrictions: Intact Restrictions Weight Bearing Restrictions Per Provider Order: No RLE Weight Bearing Per Provider Order: Partial weight bearing     Mobility  Bed Mobility               General bed mobility comments: pt received sitting up in chair and returned to sitting on commode in bathroom    Transfers Overall transfer level: Needs assistance Equipment used: 1 person hand held assist Transfers: Sit to/from Stand, Bed to chair/wheelchair/BSC Sit to Stand: Min assist           General transfer comment: minA to power up, verbal cues to push up from arm rests of the chair. minA and use of L  hand rail to lower self to commode    Ambulation/Gait Ambulation/Gait assistance: Min assist Gait Distance (Feet): 270 Feet Assistive device: Rolling walker (2 wheels) Gait Pattern/deviations: Step-through pattern, Decreased stride length, Trunk flexed Gait velocity: decr     General Gait Details: +SOB, 3/4 DOE requiring 4 standing rest breaks, RR at 32. Pt on 2Lo2 via [place]. verbal cues to look foward, no down   Stairs             Wheelchair Mobility     Tilt Bed    Modified Rankin (Stroke Patients Only)       Balance Overall balance assessment: Needs assistance Sitting-balance support: Feet supported Sitting balance-Leahy Scale: Good     Standing balance support: Single extremity supported Standing balance-Leahy Scale: Fair                              Hotel manager: No apparent difficulties  Cognition Arousal: Alert Behavior During Therapy: Flat affect   PT - Cognitive impairments: No apparent impairments                       PT - Cognition Comments: flat affect, depressed spirits Following commands: Intact      Cueing Cueing Techniques: Verbal cues  Exercises      General Comments General comments (skin integrity, edema, etc.): VSS      Pertinent Vitals/Pain Pain Assessment Pain Assessment: No/denies pain    Home Living  Prior Function            PT Goals (current goals can now be found in the care plan section) Acute Rehab PT Goals Patient Stated Goal: to get better and go home PT Goal Formulation: With patient/family Time For Goal Achievement: 12/09/23 Potential to Achieve Goals: Good Progress towards PT goals: Progressing toward goals    Frequency    Min 2X/week      PT Plan      Co-evaluation              AM-PAC PT "6 Clicks" Mobility   Outcome Measure  Help needed turning from your back to your side while in a flat bed without  using bedrails?: A Little Help needed moving from lying on your back to sitting on the side of a flat bed without using bedrails?: A Little Help needed moving to and from a bed to a chair (including a wheelchair)?: A Little Help needed standing up from a chair using your arms (e.g., wheelchair or bedside chair)?: A Little Help needed to walk in hospital room?: A Little Help needed climbing 3-5 steps with a railing? : A Lot 6 Click Score: 17    End of Session Equipment Utilized During Treatment: Oxygen Activity Tolerance: Patient tolerated treatment well Patient left:  (on commode with call light) Nurse Communication: Mobility status (RN present during session and aware pt on commode) PT Visit Diagnosis: Unsteadiness on feet (R26.81);Other abnormalities of gait and mobility (R26.89)     Time: 1610-9604 PT Time Calculation (min) (ACUTE ONLY): 16 min  Charges:    $Gait Training: 8-22 mins PT General Charges $$ ACUTE PT VISIT: 1 Visit                     Renaee Caro, PT, DPT Acute Rehabilitation Services Secure chat preferred Office #: [phone]    Jenna Moan 11/27/2023, 8:41 AM

## 2023-11-27 NOTE — TOC Progression Note (Addendum)
 Transition of Care [facility]) - Progression Note    Patient Details  Name: Ricky Bailey MRN: [id number] Date of Birth: 1956-01-03  Transition of Care [facility]) CM/SW Contact  Benjiman Bras, RN Phone Number: [phone] 11/27/2023, 3:07 PM  Clinical Narrative:     [facility] CM faxed new referral to [employer] rep, Myrtie Atkinson for [employer]. Offered choice for [facility], medicare.gov listed with ratings provide to pt and placed on chart. Contacted [employer] rep, Thurston Flow with new referral. Waiting call back.   Spoke to [employer], [employer]. Referral accepted for [facility]. Contacted [employer] for Rollator and Bedside commode for home.   Patient may need oxygen for home.    Expected Discharge Plan: Home w Home Health Services Barriers to Discharge: Continued Medical Work up  Expected Discharge Plan and Services   Discharge Planning Services: CM Consult Post Acute Care Choice: Home Health Living arrangements for the past 2 months: Apartment                 DME Arranged: Life vest DME Agency: [employer] Date DME Agency Contacted: 11/27/23 Time DME Agency Contacted: [phone] Representative spoke with at DME Agency: Myrtie Atkinson             Social Determinants of Health (SDOH) Interventions SDOH Screenings   Food Insecurity: No Food Insecurity (11/20/2023)  Housing: Low Risk  (11/20/2023)  Transportation Needs: No Transportation Needs (11/20/2023)  Utilities: Not At Risk (11/20/2023)  Alcohol Screen: Medium Risk (02/09/2023)  Social Connections: Moderately Isolated (11/20/2023)  Tobacco Use: Medium Risk (11/20/2023)    Readmission Risk Interventions     No data to display

## 2023-11-27 NOTE — Discharge Summary (Incomplete)
 Advanced Heart Failure Team  Discharge Summary   Patient ID: Ricky Bailey MRN: [id number], DOB/AGE: 1955-12-20 68 y.o. Admit date: 11/20/2023 D/C date:     12/05/2023   Primary Discharge Diagnoses:  Acute Inferior STEMI with RV infarct Acute Anterior MI CAD  Secondary Discharge Diagnoses:  VF Arrest Cardiogenic shock HFrEF with biventricular failure   Hospital Course: 68 y/o male w/ HTN and HLD but no prior cardiac history.  Presented 5/13 w/ acute inferior STEMI. He had VF arrest in the cath lab treated w/ Defib x 1, started on amio gtt. Emergent cath showed thrombotic occlusion of a very large d RCA, treated w/ balloon angioplasty and aspiration thrombectomy. Due to large vessel size and thrombus burden, no stent was placed. Overnight, he developed worsening CP and ST elevations on EKG. Urgent re-look angiography 05/14 showed prox occlusion of the RCA. Revascularization attempted but extremely challenging. Despite attempts, had 99% residual stenosis throughout the vessel w/ no flow in the distal bed.  Procedure aborted given completed infarct. Initial echo 5/15 showed normal LVEF 60-65%, RV mildly reduced.   On 5/16, pt developed hypotension and endorsed continuous 5/10 CP w/ new anteroseptal Q waves. Repeat limited echo showed drop in EF down to 35-40%, RV moderately reduced and HK. Went for coronary angiography which showed persistent total occlusion of the prox RCA w/ TIMI 0 flow and ectatic LAD w/ evidence of new plaque rupture with 99% mid vessel stenosis and 60% segmental stenosis just proximal to the culprit. Both lesions were treated successfully w/ DES. RHC c/w RV dominant shock (RA 14, PA 28/6, mPCW 11, CO 4 L/min, CI 1.68 L/min/m, PAPi 1.5). He was started on DBA and Swan was left in to monitor hemodynamics. Repeat echo 30-35%, RV moderately reduced. Dobutamine  weaned off on 05/19 but restarted several days later d/t low-output symptoms. After several days, he was successfully  weaned from inotrope support and tolerated GDMT.  LifeVest was ordered for discharge d/t frequent PVCs and NSVT.   Referred for [facility] PT and outpatient cardiac rehab.  Has close follow-up with PCP and HF clinic.   See below for hospital course by problem.   Hospital Course by Problem: CAD with acute inferior STEMI and RV infarct - LHC 5/13: thrombotic occlusion of large caliber RCA s/p balloon angio and aspiration thrombectomy. Minimal disease in LAD and LCX. C/B VF arrest in cath lab. - Repeat LHC 5/14: unsuccessful PCI. 99% RCA stenosis  - Recurrent CP and ECG changes on 5/16 -> cath with 99% LAD stenosis due to new ruptured plaque and occluded RCA -> DES x 2 to proximal LAD, no recurrent attempt on RCA.  RHC with severe RV failure shock. - No further chest pain.  - Continue ASA 81 mg  + Effient  10 mg + crestor  40 mg daily   2. Cardiogenic shock due to STEMI and RV infarct - echo 11/22/23 EF 60-65 - echo 5/16 EF 35-40% RV moderately down - Repeat Limited Echo 5/20, EF 30-35%, RV mod reduced systolic function.   - DBA off 05/19. Failed wean and restarted on 05/22. Weaned off DBA again on 05/27. CO-OX 57% this am. Feeling well. Ambulating halls and dyspnea significantly improved. - JVP 7-8 cm. Hold off on daily diuretic. Will discharge home with Torsemide 20 mg PRN and Potassium chloride  20 mEq PRN.  - Continue Farxiga 10 mg daily  - Continue digoxin  0.125 mcg daily - Continue losartan 25 mg daily.  - Continue spironolactone   25 mg daily  -  No beta blocker d/t recent shock as above - LifeVest has been delivered   3. VF arrest, PVCs/NSVT - in the setting of acute STEMI and failed revascularization attempts - Amiodarone  stopped 5/17 - no further arrhythmias - No beta blocker due to cardiogenic shock.  - LifeVest as above.   4. Ascending Aortic Aneurysm - measuring 4.2 mm - stable   5. AKI  - Scr better today at 1.5 after holding diuretic.  Discharge Vitals: Blood pressure  108/83, pulse 86, temperature 97.9 F (36.6 C), temperature source Oral, resp. rate 16, height 5\' 11"  (1.803 m), weight 120.2 kg, SpO2 97%.  Physical Exam: General:  Well appearing.  Neck: JVP 7-8 Cor: Regular rate & rhythm. No rubs, gallops or murmurs. Lungs: clear Abdomen: obese, soft, nontender, nondistended.  Extremities: no edema Neuro: alert & orientedx3. Affect pleasant   Labs: Lab Results  Component Value Date   WBC 9.2 12/04/2023   HGB 11.2 (L) 12/04/2023   HCT 34.7 (L) 12/04/2023   MCV 93.3 12/04/2023   PLT 411 (H) 12/04/2023    Recent Labs  Lab 12/04/23 0831 12/05/23 0700  NA 135 135  K 4.4 4.4  CL 105 105  CO2 22 22  BUN 31* 30*  CREATININE 1.59* 1.49*  CALCIUM  9.1 9.0  PROT 7.6  --   BILITOT 1.2  --   ALKPHOS 91  --   ALT 77*  --   AST 34  --   GLUCOSE 135* 149*   Lab Results  Component Value Date   CHOL 167 11/20/2023   HDL 32 (L) 11/20/2023   LDLCALC 122 (H) 11/20/2023   TRIG 65 11/20/2023   BNP (last 3 results) No results for input(s): "BNP" in the last 8760 hours.  ProBNP (last 3 results) No results for input(s): "PROBNP" in the last 8760 hours.   Diagnostic Studies/Procedures   No results found.   R/LHC 11/23/23: 1.  Persistent total occlusion of the proximal RCA with TIMI 0 flow 2.  Patent left main with no significant stenosis 3.  Patent circumflex with mild ectasia and nonobstructive plaquing 3.  Ectatic LAD with evidence of new plaque rupture, 99% mid vessel stenosis and 60% segmental stenosis just proximal to the culprit lesion, both lesions treated successfully with drug-eluting stent platforms 4.  Mildly elevated LVEDP 5.  Right heart catheterization data: RA mean 14 mmHg RV 34/6, RVEDP 18 mmHg PA 28/6 mean of 21 mmHg Pulmonary wedge pressure A-wave 16, V wave 15, mean 11 mmHg Cardiac output 4 L/min, cardiac index 1.68 L/min/m   Recommendations: DAPT with aspirin  and prasugrel  12 months without interruption, Swan-Ganz  catheter left in place for further hemodynamic monitoring in this patient with hemodynamics consistent with RV failure.  Management per advanced heart failure team. Findings communicated with patient's daughter.     Discharge Medications   Allergies as of 12/05/2023   No Known Allergies      Medication List     STOP taking these medications    amLODipine  5 MG tablet Commonly known as: NORVASC    traZODone  50 MG tablet Commonly known as: DESYREL        TAKE these medications    allopurinol  300 MG tablet Commonly known as: ZYLOPRIM  Take 0.5 tablets (150 mg total) by mouth daily.   aspirin  EC 81 MG tablet Take 1 tablet (81 mg total) by mouth daily. Swallow whole. Start taking on: Dec 06, 2023   dapagliflozin propanediol 10 MG Tabs tablet Commonly known as: FARXIGA Take  1 tablet (10 mg total) by mouth daily. Start taking on: Dec 06, 2023   digoxin  0.125 MG tablet Commonly known as: LANOXIN  Take 1 tablet (0.125 mg total) by mouth daily. Start taking on: Dec 06, 2023   finasteride  5 MG tablet Commonly known as: PROSCAR  Take 1 tablet (5 mg total) by mouth daily.   losartan 25 MG tablet Commonly known as: COZAAR Take 1 tablet (25 mg total) by mouth daily. Start taking on: Dec 06, 2023   multivitamin with minerals Tabs tablet Take 1 tablet by mouth daily.   potassium chloride  SA 20 MEQ tablet Commonly known as: KLOR-CON  M Take 1 tablet as needed when taking Torsemide   prasugrel  10 MG Tabs tablet Commonly known as: EFFIENT  Take 1 tablet (10 mg total) by mouth daily. Start taking on: Dec 06, 2023   rosuvastatin  40 MG tablet Commonly known as: CRESTOR  Take 1 tablet (40 mg total) by mouth daily. Start taking on: Dec 06, 2023 What changed:  medication strength how much to take   sertraline  50 MG tablet Commonly known as: ZOLOFT  Take 1 tablet (50 mg total) by mouth daily.   spironolactone  25 MG tablet Commonly known as: ALDACTONE  Take 1 tablet (25 mg  total) by mouth daily. Start taking on: Dec 06, 2023   torsemide 20 MG tablet Commonly known as: DEMADEX Take 1 tablet as needed for leg edema, weight gain, shortness of breath               Durable Medical Equipment  (From admission, onward)           Start     Ordered   11/28/23 1335  For home use only DME Vest life vest  Once       Comments: ZOLL Life Vest   Identify indication. Type yes or no  Cardiac Arrest due to VF or sustained VT: Yes  Familial or inherited condition with SCA risk: MI with an EF < 35% or DCM with an EF < 35%: Yes  ICD Explanation:  Other condition with high risk of VT/VF:   Life Vest Setting  VT 150 bpm VF 200 BPM 150Jx5 Length of need: 3 months  Start Date: 09/01/23   11/28/23 1334   11/28/23 1334  For home use only DME Vest life vest  Once        11/28/23 1334   11/27/23 1620  For home use only DME 3 n 1  Once        11/27/23 1620   11/27/23 1620  For home use only DME 4 wheeled rolling walker with seat  Once       Question Answer Comment  Patient needs a walker to treat with the following condition Heart failure [facility])   Patient needs a walker to treat with the following condition Physical deconditioning      11/27/23 1620   11/27/23 1512  Heart failure home health orders  (Heart failure home health orders / Face to face)  Once       Comments: Heart Failure Follow-up Care:  Verify follow-up appointments per Patient Discharge Instructions. Confirm transportation arranged. Reconcile home medications with discharge medication list. Remove discontinued medications from use. Assist patient/caregiver to manage medications using pill box. Reinforce low sodium food selection Assessments: Vital signs and oxygen saturation at each visit. Assess home environment for safety concerns, caregiver support and availability of low-sodium foods. Consult Child psychotherapist, PT/OT, Dietitian, and CNA based on assessments. Perform comprehensive  cardiopulmonary assessment.  Notify MD for any change in condition or weight gain of 3 pounds in one day or 5 pounds in one week with symptoms. Daily Weights and Symptom Monitoring: Ensure patient has access to scales. Teach patient/caregiver to weigh daily before breakfast and after voiding using same scale and record.    Teach patient/caregiver to track weight and symptoms and when to notify Provider. Activity: Develop individualized activity plan with patient/caregiver.   Question Answer Comment  Heart Failure Follow-up Care Advanced Heart Failure (AHF) Clinic at [phone]   Lab frequency Other see comments   Fax lab results to AHF Clinic at [phone]   Diet Low Sodium Heart Healthy   Fluid restrictions: 1800 mL Fluid   Skilled Nurse to notify MD of weight trends weekly for first 2 weeks. May fax or call: AHF Clinic at [phone] (fax) or [phone]      11/27/23 1512            Disposition   The patient will be discharged in stable condition to home. Discharge Instructions     Amb Referral to Cardiac Rehabilitation   Complete by: As directed    Diagnosis:  Coronary Stents STEMI     After initial evaluation and assessments completed: Virtual Based Care may be provided alone or in conjunction with Phase 2 Cardiac Rehab based on patient barriers.: Yes   Intensive Cardiac Rehabilitation (ICR) [facility] location only OR Traditional Cardiac Rehabilitation (TCR) *If criteria for ICR are not met will enroll in TCR [facility] only): Yes   Increase activity slowly   Complete by: As directed    PICC line removal   Complete by: As directed        Follow-up Information     [facility] Follow up on 12/12/2023.   Specialty: Cardiology Why: 10 am Hospital Follow Up in the Advanced Heart Failure Clinic at [facility] [place] [facility] (Women's and Children's Entrance) Contact information: [address] [place]   [phone]        Care, [facility] Follow up.   Specialty: Home Health Services Why: Physical Therapy/Occupational Therapy, Aide-office to call with visit times. Contact information: [address] [place] [address] [phone]         Lonni Robert, MD Follow up.   Specialty: Family Medicine Why: hospital follow up schedueled for December 13, 2023 at 11 am with Burdette Carolin PA. please call to reschedule if you unable to keep appt Contact information: [address] [place] [address] [phone]                   Duration of Discharge Encounter: Greater than 35 minutes   Signed, Claborn Janusz N, PA-C   12/05/2023, 11:27 AM

## 2023-11-27 NOTE — Plan of Care (Signed)
  Problem: Education: Goal: Knowledge of General Education information will improve Description: Including pain rating scale, medication(s)/side effects and non-pharmacologic comfort measures Outcome: Progressing   Problem: Health Behavior/Discharge Planning: Goal: Ability to manage health-related needs will improve Outcome: Progressing   Problem: Clinical Measurements: Goal: Ability to maintain clinical measurements within normal limits will improve Outcome: Progressing Goal: Will remain free from infection Outcome: Progressing Goal: Diagnostic test results will improve Outcome: Progressing Goal: Respiratory complications will improve Outcome: Progressing Goal: Cardiovascular complication will be avoided Outcome: Progressing   Problem: Activity: Goal: Risk for activity intolerance will decrease Outcome: Progressing   Problem: Nutrition: Goal: Adequate nutrition will be maintained Outcome: Progressing   Problem: Coping: Goal: Level of anxiety will decrease Outcome: Progressing   Problem: Safety: Goal: Ability to remain free from injury will improve Outcome: Progressing   Problem: Skin Integrity: Goal: Risk for impaired skin integrity will decrease Outcome: Progressing   Problem: Education: Goal: Understanding of CV disease, CV risk reduction, and recovery process will improve Outcome: Progressing Goal: Individualized Educational Video(s) Outcome: Progressing   Problem: Education: Goal: Individualized Educational Video(s) Outcome: Progressing   Problem: Activity: Goal: Ability to return to baseline activity level will improve Outcome: Progressing   Problem: Cardiovascular: Goal: Ability to achieve and maintain adequate cardiovascular perfusion will improve Outcome: Progressing Goal: Vascular access site(s) Level 0-1 will be maintained Outcome: Progressing   Problem: Health Behavior/Discharge Planning: Goal: Ability to safely manage health-related needs  after discharge will improve Outcome: Progressing

## 2023-11-27 NOTE — Progress Notes (Signed)
 Advanced Heart Failure Rounding Note  Cardiologist: None   Chief Complaint: STEMI  Subjective:    68 y/o male admitted with RCA STEMI and VF arrest. Unable to open RCA. Subsequently developed recurrent CP and ECG changes. Re-look cath 5/16 with new ruptured plaque in LAD -> stent. RHC with evidence for severe RV failure/shock  Echo EF 35-40% RV moderately HK  DBA weaned off yesterday. Co-ox 61% today.  SCr improving, 1.6>1.4 today   CVP 10  Still feels SOB w/ exertion. Denies CP.    Objective:    Weight Range: 121 kg Body mass index is 37.21 kg/m.   Vital Signs:   Temp:  [97.6 F (36.4 C)-99.5 F (37.5 C)] 98.1 F (36.7 C) (05/20 0600) Pulse Rate:  [74-93] 78 (05/20 0730) Resp:  [18-39] 24 (05/20 0730) BP: (109-137)/(65-97) 120/83 (05/20 0700) SpO2:  [90 %-99 %] 91 % (05/20 0730) Last BM Date : 11/25/23  Weight change: Filed Weights   11/21/23 0500 11/25/23 0645 11/26/23 0700  Weight: 119.8 kg 118.3 kg 121 kg   Intake/Output:  Intake/Output Summary (Last 24 hours) at 11/27/2023 0755 Last data filed at 11/27/2023 0400 Gross per 24 hour  Intake 960.56 ml  Output 1050 ml  Net -89.44 ml   Physical Exam   General:  fatigue appearing, obese. No respiratory difficulty HEENT: normal Neck: supple. JVD 10 cm. Carotids 2+ bilat; no bruits. No lymphadenopathy or thyromegaly appreciated. Cor: PMI nondisplaced. Regular rate & rhythm. No rubs, gallops or murmurs. Lungs: clear Abdomen: soft, nontender, mildly distended. No hepatosplenomegaly. No bruits or masses. Good bowel sounds. Extremities: no cyanosis, clubbing, rash, edema + RUE PICC Neuro: alert & oriented x 3, cranial nerves grossly intact. moves all 4 extremities w/o difficulty. Affect pleasant.  Telemetry   NSR 70s. No arrhythmias, personally reviewed    Labs    CBC Recent Labs    11/26/23 0053  WBC 8.1  HGB 10.4*  HCT 31.1*  MCV 92.6  PLT 205   Basic Metabolic Panel Recent Labs     11/26/23 1240 11/27/23 0614  NA 133* 135  K 4.1 4.2  CL 101 103  CO2 23 22  GLUCOSE 153* 106*  BUN 29* 31*  CREATININE 1.63* 1.49*  CALCIUM  8.3* 8.7*   Liver Function Tests No results for input(s): "AST", "ALT", "ALKPHOS", "BILITOT", "PROT", "ALBUMIN" in the last 72 hours.  Hemoglobin A1C No results for input(s): "HGBA1C" in the last 72 hours.  Fasting Lipid Panel No results for input(s): "CHOL", "HDL", "LDLCALC", "TRIG", "CHOLHDL", "LDLDIRECT" in the last 72 hours.  Imaging   No results found.   Medications:    Scheduled Medications:  allopurinol   150 mg Oral Daily   aspirin  EC  81 mg Oral Daily   Chlorhexidine  Gluconate Cloth  6 each Topical Daily   digoxin   0.125 mg Oral Daily   finasteride   5 mg Oral Daily   heparin   5,000 Units Subcutaneous Q8H   polyethylene glycol  17 g Oral Daily   prasugrel   10 mg Oral Daily   rosuvastatin   40 mg Oral Daily   senna-docusate  2 tablet Oral QHS   sodium chloride  flush  10-40 mL Intracatheter Q12H   spironolactone   12.5 mg Oral Daily   Infusions:   PRN Medications: acetaminophen , magnesium  hydroxide, ondansetron  (ZOFRAN ) IV, mouth rinse, oxyCODONE , sodium chloride  flush, traZODone   Patient Profile   Ricky Bailey is a 68 y.o. male with depression, HTN, HLD, obesity, BPH, and OSA. Admitted with STEMI  Assessment/Plan   CAD with acute inferior STEMI and RV infarct - LHC 5/13: thrombotic occlusion of larg caliber RCA s/p balloon angio and aspiration thrombectomy. Minimal disease in LAD and LCX. C/B VF arrest in cath lab. - Repeat LHC 5/14: unsuccessful PCI. 99% residual stenosis  - Recurrent CP and ECG changes on 5/16 -> cath with 99% LAD stenosis due to new ruptured plaque -> DES  RHC with severe RV failure shock - Stable w/o CP  - Continue ASA 81 mg daily + Effient  10 mg daily  - Continue Crestor  40 mg daily  - Continue to mobilize. CR consult placed   2. Cardiogenic shock due to STEMI and RV infarct - echo  11/22/23 EF 60-65 - echo 5/16 EF 35-40% RV moderately down - Now off inotropes/ pressors. Co-ox stable 62%  - CVP 10. Given RV failure, will continue to hold diuretics today   - Continue digoxin  0.125 mcg - Start losartan 25 mg daily  - Increase spironolactone  to 25 mg daily  - Plan repeat Limited echo today, if EF remains < 35% will need LifeVest at d/c   3. VF arrest PVCs/NSVT - in the setting of acute STEMI and failed revasc attempts - Amio stopped 5/17. Now off DBA - no further arrhthymias - plan repeat limited echo today, if EF remain < 35%, will need LifeVest at discharge    4. Ascending Aortic Aneurysm - measuring 4.2 mm - stable  5. AKI  - Scr  1.27>1.6>1.4 today  - Continue to hold diuretics today   6. Fevers - Noted 5/18, Tmax 100.6 F. Now resolved. Mild leukocytosis resolved.  - BCx negative.   Plan transfer out of 2H today. AHF team will continue to follow    Length of Stay: [address] Ricky Bailey  11/27/2023, 7:55 AM  Advanced Heart Failure Team Pager [phone] (M-F; 7a - 5p)  Please contact [facility] for night-coverage after hours (5p -7a ) and weekends on [email or web address]

## 2023-11-27 NOTE — Progress Notes (Signed)
 Echocardiogram 2D Echocardiogram has been performed.  Ricky Bailey Ferman Basilio RDCS 11/27/2023, 9:08 AM

## 2023-11-27 NOTE — Progress Notes (Signed)
 CARDIAC REHAB PHASE I    Pt resting in bed, feeling ok today with complaint of fatigue. Pt ambulated with PT this am, reports tolerating fairly well. States he feels very tired and weak. Post MI/stent education including restrictions, risk factors, exercise guidelines, antiplatelet therapy importance, MI booklet, NTG use, heart healthy diet and CRP2 reviewed. All questions and concerns addressed. Will refer to [facility] for CRP2. Will continue to follow.   [id number] Ronny Colas, RN BSN 11/27/2023 9:14 AM

## 2023-11-28 ENCOUNTER — Telehealth (HOSPITAL_COMMUNITY): Payer: Self-pay | Admitting: Pharmacy Technician

## 2023-11-28 ENCOUNTER — Other Ambulatory Visit (HOSPITAL_COMMUNITY): Payer: Self-pay

## 2023-11-28 DIAGNOSIS — I2119 ST elevation (STEMI) myocardial infarction involving other coronary artery of inferior wall: Secondary | ICD-10-CM | POA: Diagnosis not present

## 2023-11-28 LAB — BASIC METABOLIC PANEL WITH GFR
Anion gap: 7 (ref 5–15)
BUN: 28 mg/dL — ABNORMAL HIGH (ref 8–23)
CO2: 23 mmol/L (ref 22–32)
Calcium: 8.4 mg/dL — ABNORMAL LOW (ref 8.9–10.3)
Chloride: 104 mmol/L (ref 98–111)
Creatinine, Ser: 1.39 mg/dL — ABNORMAL HIGH (ref 0.61–1.24)
GFR, Estimated: 55 mL/min — ABNORMAL LOW (ref >60)
Glucose, Bld: 113 mg/dL — ABNORMAL HIGH (ref 70–99)
Potassium: 4 mmol/L (ref 3.5–5.1)
Sodium: 134 mmol/L — ABNORMAL LOW (ref 135–145)

## 2023-11-28 LAB — COOXEMETRY PANEL
Carboxyhemoglobin: 1.2 % (ref 0.5–1.5)
Methemoglobin: <0.7 % (ref 0.0–1.5)
O2 Saturation: 55.5 %
Total hemoglobin: 13.3 g/dL (ref 12.0–16.0)

## 2023-11-28 MED ORDER — POTASSIUM CHLORIDE CRYS ER 20 MEQ PO TBCR
40.0000 meq | EXTENDED_RELEASE_TABLET | Freq: Once | ORAL | Status: AC
  Administered 2023-11-28: 40 meq via ORAL
  Filled 2023-11-28: qty 2

## 2023-11-28 MED ORDER — FUROSEMIDE 10 MG/ML IJ SOLN
40.0000 mg | Freq: Once | INTRAMUSCULAR | Status: AC
  Administered 2023-11-28: 40 mg via INTRAVENOUS
  Filled 2023-11-28: qty 4

## 2023-11-28 MED ORDER — DAPAGLIFLOZIN PROPANEDIOL 10 MG PO TABS
10.0000 mg | ORAL_TABLET | Freq: Every day | ORAL | Status: DC
  Administered 2023-11-28 – 2023-12-05 (×8): 10 mg via ORAL
  Filled 2023-11-28 (×8): qty 1

## 2023-11-28 NOTE — Progress Notes (Signed)
 Physical Therapy Treatment Patient Details Name: Ricky Bailey MRN: [id number] DOB: 1956-05-08 Today's Date: 11/28/2023   History of Present Illness 68 y.o. male presents to [facility] 11/20/23 due to chest pain, code STEMI. Emergent PCI which was unsuccessful 2/2 occluded large RCA. 5/15 progressive R HF, ECG changes, and worsening hypotension w/ urgent R/L heart cath. Pt with new ruptured plaque in LAD, s/p stent. PMHx: HTN, obesity   PT Comments  Pt in bed upon arrival and agreeable to PT session. Pt improved in today's session by needing less assistance to stand and increasing gait distance. He was able to stand with CGA and RW and ambulate 498ft. Mobility is currently limited by impaired cardiopulmonary system with pt requiring frequent standing rest breaks due to feeling SOB. Pt would desat to 70s on RA, however, was able to recover to >92% SpO2 with PLB after 15-20 secs. Pt was then able to ascend/descend 3 steps using portable step with 1UE on handrail and MinA for steadying assist. Family in room during session and report feeling comfortable providing assistance for stair negotiation upon d/c home. Continue to recommend HHPT upon d/c. Acute PT to follow.   96% SpO2 on RA at rest   If plan is discharge home, recommend the following: A little help with walking and/or transfers;A little help with bathing/dressing/bathroom;Assistance with cooking/housework;Assist for transportation;Help with stairs or ramp for entrance   Can travel by private vehicle      Yes  Equipment Recommendations  Rolling walker (2 wheels)       Precautions / Restrictions Precautions Precautions: Fall Recall of Precautions/Restrictions: Intact Precaution/Restrictions Comments: watch O2 Restrictions Weight Bearing Restrictions Per Provider Order: No     Mobility  Bed Mobility Overal bed mobility: Needs Assistance Bed Mobility: Sit to Supine, Rolling, Sidelying to Sit Rolling: Supervision, Used rails Sidelying to  sit: Contact guard assist, HOB elevated, Used rails   Sit to supine: Contact guard assist   General bed mobility comments: cues for log roll technique with pt able to roll with supervision and into upright position with CGA for safety with lines. CGA for return to supine    Transfers Overall transfer level: Needs assistance Equipment used: Rolling walker (2 wheels) Transfers: Sit to/from Stand Sit to Stand: Contact guard assist     General transfer comment: CGA for safety, able to stand with no physical assist needed    Ambulation/Gait Ambulation/Gait assistance: Contact guard assist Gait Distance (Feet): 400 Feet Assistive device: Rolling walker (2 wheels) Gait Pattern/deviations: Step-through pattern, Decreased stride length, Trunk flexed Gait velocity: decr     General Gait Details: 6-8 standing rest breaks due to SOB and de-satting on RA. Able to recover to >92% SpO2 with 15-20 seconds of PLB   Stairs Stairs: Yes Stairs assistance: Min assist Stair Management: One rail Left, Step to pattern, Backwards, Forwards Number of Stairs: 3 General stair comments: Used portable step in room with bed rail for UE support. MinA for slight steadying assist with 1UE on rail. Family in room observing and report feeling comfortable providing support upon d/c home     Balance Overall balance assessment: Needs assistance Sitting-balance support: Feet supported Sitting balance-Leahy Scale: Good     Standing balance support: No upper extremity supported Standing balance-Leahy Scale: Fair Standing balance comment: able to stand statically with no UE support, RW for gait       Communication Communication Communication: No apparent difficulties  Cognition Arousal: Alert Behavior During Therapy: WFL for tasks assessed/performed   PT -  Cognitive impairments: No apparent impairments    Following commands: Intact      Cueing Cueing Techniques: Verbal cues         Pertinent  Vitals/Pain Pain Assessment Pain Assessment: No/denies pain     PT Goals (current goals can now be found in the care plan section) Acute Rehab PT Goals PT Goal Formulation: With patient/family Time For Goal Achievement: 12/09/23 Potential to Achieve Goals: Good Progress towards PT goals: Progressing toward goals    Frequency    Min 2X/week       AM-PAC PT "6 Clicks" Mobility   Outcome Measure  Help needed turning from your back to your side while in a flat bed without using bedrails?: A Little Help needed moving from lying on your back to sitting on the side of a flat bed without using bedrails?: A Little Help needed moving to and from a bed to a chair (including a wheelchair)?: A Little Help needed standing up from a chair using your arms (e.g., wheelchair or bedside chair)?: A Little Help needed to walk in hospital room?: A Little Help needed climbing 3-5 steps with a railing? : A Little 6 Click Score: 18    End of Session Equipment Utilized During Treatment: Gait belt;Oxygen Activity Tolerance: Patient tolerated treatment well Patient left: in bed;with call bell/phone within reach;with family/visitor present Nurse Communication: Mobility status (O2 sats) PT Visit Diagnosis: Unsteadiness on feet (R26.81);Other abnormalities of gait and mobility (R26.89)     Time: 4098-1191 PT Time Calculation (min) (ACUTE ONLY): 36 min  Charges:    $Gait Training: 8-22 mins $Therapeutic Activity: 8-22 mins PT General Charges $$ ACUTE PT VISIT: 1 Visit                    Orysia Blas, PT, DPT Secure Chat Preferred  Rehab Office [phone]   Alissa April Adela Ades 11/28/2023, 2:12 PM

## 2023-11-28 NOTE — TOC Progression Note (Signed)
 Transition of Care [facility]) - Progression Note    Patient Details  Name: Ricky Bailey MRN: [id number] Date of Birth: 1955/07/12  Transition [facility]) CM/SW Contact  Ernst Heap Phone Number: [phone] 11/28/2023, 10:07 AM  Clinical Narrative:  10:02 AM- HF CSW received a VM from the patients daughter and returned her call. Patients daughter asked if the patient can be screened for Disability. Patients daughter stated after speaking with medical staff they have learned that he will not be able to return to work. CSW explained the process and that his information will be sent to the [facility] team.   TOC will continue following.      Expected Discharge Plan: Home w Home Health Services Barriers to Discharge: Continued Medical Work up  Expected Discharge Plan and Services   Discharge Planning Services: CM Consult Post Acute Care Choice: Home Health Living arrangements for the past 2 months: Apartment                 DME Arranged: Life vest DME Agency: [facility] Date DME Agency Contacted: 11/27/23 Time DME Agency Contacted: [phone] Representative spoke with at DME Agency: Myrtie Atkinson             Social Determinants of Health (SDOH) Interventions SDOH Screenings   Food Insecurity: No Food Insecurity (11/20/2023)  Housing: Low Risk  (11/20/2023)  Transportation Needs: No Transportation Needs (11/20/2023)  Utilities: Not At Risk (11/20/2023)  Alcohol Screen: Medium Risk (02/09/2023)  Social Connections: Moderately Isolated (11/20/2023)  Tobacco Use: Medium Risk (11/20/2023)    Readmission Risk Interventions     No data to display

## 2023-11-28 NOTE — Telephone Encounter (Signed)
 Patient Product/process development scientist completed.    The patient is insured through [employer]. Patient has Medicare and is not eligible for a copay card, but may be able to apply for patient assistance or Medicare RX Payment Plan (Patient Must reach out to their plan, if eligible for payment plan), if available.    Ran test claim for Farixga 10 mg and the current 30 day co-pay is $302.00 due to a $255.00 deductible.  Will be $47.00 once deductible is met.  Ran test claim for Jardiance 10 mg and the current 30 day co-pay is $302.00 due to a $255.00 deductible.  Will be $47.00 once deductible is met.   This test claim was processed through [facility]- copay amounts may vary at other pharmacies due to pharmacy/plan contracts, or as the patient moves through the different stages of their insurance plan.     Roland Earl, CPHT Pharmacy Technician III Certified Patient Advocate [facility] Patient Advocate Team Direct Number: [phone]  Fax: [phone]

## 2023-11-28 NOTE — TOC Progression Note (Addendum)
 Transition of Care [facility]) - Progression Note    Patient Details  Name: Ricky Bailey MRN: [id number] Date of Birth: 11-15-1955  Transition of Care [facility]) CM/SW Contact  Graves-Bigelow, Jari Merles, RN Phone Number: 11/28/2023, 12:15 PM  Clinical Narrative: Case Manager received a message from the staff RN that the daughter has questions regarding home health agency. Daughter wanted to change the agency from [place] to [place]. Referral submitted to [facility] and start of care to begin within 24-48 hours post transition home. [facility] has been cancelled. Awaiting insurance authorization for Life Vest. DME is in the room. No further needs identified at this time.     1355 11-28-23 Updated Insurance Information and orders submitted to [facility].   1650 11-28-23  Insurance has approved the [employer] and the patient is getting fit today. No further needs identified at this time.   Expected Discharge Plan: Home w Home Health Services Barriers to Discharge: No Barriers Identified  Expected Discharge Plan and Services   Discharge Planning Services: CM Consult Post Acute Care Choice: Home Health Living arrangements for the past 2 months: Apartment                 DME Arranged: Life vest DME Agency: [facility] Date DME Agency Contacted: 11/27/23 Time DME Agency Contacted: [phone] Representative spoke with at DME Agency: Baron Lichtenstein Arranged: PT, OT, Nurse's Aide HH Agency: [facility] Date [facility] Agency Contacted: 11/28/23 Time HH Agency Contacted: 1215 Representative spoke with at [facility] Agency: Randel Buss   Social Determinants of Health (SDOH) Interventions SDOH Screenings   Food Insecurity: No Food Insecurity (11/20/2023)  Housing: Low Risk  (11/20/2023)  Transportation Needs: No Transportation Needs (11/20/2023)  Utilities: Not At Risk (11/20/2023)  Alcohol Screen: Medium Risk (02/09/2023)  Social Connections: Moderately Isolated (11/20/2023)  Tobacco Use: Medium Risk (11/20/2023)    Readmission Risk  Interventions     No data to display

## 2023-11-28 NOTE — Plan of Care (Signed)
  Problem: Safety: Goal: Ability to remain free from injury will improve Outcome: Progressing   Problem: Skin Integrity: Goal: Risk for impaired skin integrity will decrease Outcome: Progressing   Problem: Activity: Goal: Ability to return to baseline activity level will improve Outcome: Progressing

## 2023-11-28 NOTE — Progress Notes (Addendum)
 Advanced Heart Failure Rounding Note  Cardiologist: None   Chief Complaint: STEMI  Subjective:    68 y/o male admitted with RCA STEMI and VF arrest. Unable to open RCA. Subsequently developed recurrent CP and ECG changes. Re-look cath 5/16 with new ruptured plaque in Bailey -> stent. RHC with evidence for severe RV failure/shock  Echo EF 35-40% RV moderately HK  Co-ox 55% off DBA.   CVP 13. Wts trending up. C/w slight conversational dyspnea at times + mild exertional dyspnea, though he feels like this is improving. Denies CP.   SCr improving, 1.6>1.49>>1.39 today      Objective:    Weight Range: 121 kg Body mass index is 37.21 kg/m.   Vital Signs:   Temp:  [97.5 F (36.4 C)-99.2 F (37.3 C)] 97.5 F (36.4 C) (05/21 0730) Pulse Rate:  [75-106] 80 (05/21 0918) Resp:  [18-39] 36 (05/21 0800) BP: (94-131)/(63-102) 119/87 (05/21 0800) SpO2:  [90 %-100 %] 94 % (05/21 0800) Last BM Date : 11/28/23  Weight change: Filed Weights   11/21/23 0500 11/25/23 0645 11/26/23 0700  Weight: 119.8 kg 118.3 kg 121 kg   Intake/Output:  Intake/Output Summary (Last 24 hours) at 11/28/2023 1003 Last data filed at 11/28/2023 0600 Gross per 24 hour  Intake 730 ml  Output 1050 ml  Net -320 ml   Physical Exam   CVP 13  General:  mildly fatigue appearing, + mild conversational dyspnea.  HEENT: normal Neck: supple. JVD 12 cm. Carotids 2+ bilat; no bruits. No lymphadenopathy or thyromegaly appreciated. Cor: PMI nondisplaced. Regular rate & rhythm. No rubs, gallops or murmurs. Lungs: decreased BS at the bases  Abdomen: soft, nontender, nondistended. No hepatosplenomegaly. No bruits or masses. Good bowel sounds. Extremities: no cyanosis, clubbing, rash, trace b/l LEE edema + RUE PICC  Neuro: alert & oriented x 3, cranial nerves grossly intact. moves all 4 extremities w/o difficulty. Affect pleasant.   Telemetry   NSR 80s, no arrthymias personally reviewed    Labs    CBC Recent  Labs    11/26/23 0053  WBC 8.1  HGB 10.4*  HCT 31.1*  MCV 92.6  PLT 205   Basic Metabolic Panel Recent Labs    13/08/65 0614 11/28/23 0521  NA 135 134*  K 4.2 4.0  CL 103 104  CO2 22 23  GLUCOSE 106* 113*  BUN 31* 28*  CREATININE 1.49* 1.39*  CALCIUM  8.7* 8.4*   Liver Function Tests No results for input(s): "AST", "ALT", "ALKPHOS", "BILITOT", "PROT", "ALBUMIN" in the last 72 hours.  Hemoglobin A1C No results for input(s): "HGBA1C" in the last 72 hours.  Fasting Lipid Panel No results for input(s): "CHOL", "HDL", "LDLCALC", "TRIG", "CHOLHDL", "LDLDIRECT" in the last 72 hours.  Imaging   No results found.   Medications:    Scheduled Medications:  allopurinol   150 mg Oral Daily   aspirin  EC  81 mg Oral Daily   Chlorhexidine  Gluconate Cloth  6 each Topical Daily   dapagliflozin propanediol  10 mg Oral Daily   digoxin   0.125 mg Oral Daily   finasteride   5 mg Oral Daily   heparin   5,000 Units Subcutaneous Q8H   losartan  25 mg Oral Daily   polyethylene glycol  17 g Oral Daily   prasugrel   10 mg Oral Daily   rosuvastatin   40 mg Oral Daily   senna-docusate  2 tablet Oral QHS   sodium chloride  flush  10-40 mL Intracatheter Q12H   spironolactone   25 mg  Oral Daily   Infusions:   PRN Medications: acetaminophen , magnesium  hydroxide, ondansetron  (ZOFRAN ) IV, mouth rinse, oxyCODONE , sodium chloride  flush, traZODone   Patient Profile   Ricky Bailey is a 68 y.o. male with depression, HTN, HLD, obesity, BPH, and OSA. Admitted with STEMI  Assessment/Plan   CAD with acute inferior STEMI and RV infarct - LHC 5/13: thrombotic occlusion of larg caliber RCA s/p balloon angio and aspiration thrombectomy. Minimal disease in Bailey and LCX. C/B VF arrest in cath lab. - Repeat LHC 5/14: unsuccessful PCI. 99% residual stenosis  - Recurrent CP and ECG changes on 5/16 -> cath with 99% Bailey stenosis due to new ruptured plaque -> DES  RHC with severe RV failure shock - denies  current CP  - Continue ASA 81 mg daily + Effient  10 mg daily  - Continue Crestor  40 mg daily   2. Cardiogenic shock due to STEMI and RV infarct - echo 11/22/23 EF 60-65 - echo 5/16 EF 35-40% RV moderately down - Repeat Limited Echo 5/20, EF 30-35%, RV mod reduced  - Now off inotropes/ pressors. Co-ox marginal, 55%  - CVP 13. Give 1x dose IV Lasix  40 mg  - Start Farxiga 10 mg daily  - Continue digoxin  0.125 mcg - Continue losartan 25 mg daily. BP too soft currently for Entresto   - Continue spironolactone   25 mg daily  - EF < 35% post MI. Needs LifeVest at d/c. Has been ordered.   3. VF arrest PVCs/NSVT - in the setting of acute STEMI and failed revasc attempts - Amio stopped 5/17. Now off DBA - no further arrhthymias - LifeVest at discharge   4. Ascending Aortic Aneurysm - measuring 4.2 mm - stable  5. AKI  - Scr  1.27>1.6>1.4>1.3 today  - follow BMP   6. Fevers - Noted 5/18, Tmax 100.6 F. Now resolved. Mild leukocytosis resolved.  - BCx negative.   Monitor 1 more day. If breathing and volume status improved, plan d/c home tomorrow.    Length of Stay: [address], [place]  11/28/2023, 10:03 AM  Advanced Heart Failure Team Pager [phone] (M-F; 7a - 5p)  Please contact [facility] for night-coverage after hours (5p -7a ) and weekends on [email or web address]

## 2023-11-28 NOTE — Progress Notes (Signed)
 CARDIAC REHAB PHASE I   Pt resting in bed, feeling well today. Only complaint is fatigue. Reviewed postop MI/sent education, provided yesterday, with pt and daughter. All questions and concerns addressed. Referral for CRP2 sent to [facility]. PT to see later today for walk O2 evaluation. Will continue to follow.   [id number]  Ronny Colas, RN BSN 11/28/2023 12:12 PM

## 2023-11-29 DIAGNOSIS — I2119 ST elevation (STEMI) myocardial infarction involving other coronary artery of inferior wall: Secondary | ICD-10-CM | POA: Diagnosis not present

## 2023-11-29 LAB — COOXEMETRY PANEL
Carboxyhemoglobin: 1.8 % — ABNORMAL HIGH (ref 0.5–1.5)
Carboxyhemoglobin: 1.5 % (ref 0.5–1.5)
Methemoglobin: <0.7 % (ref 0.0–1.5)
Methemoglobin: 0.8 % (ref 0.0–1.5)
O2 Saturation: 56.6 %
O2 Saturation: 58.1 %
Total hemoglobin: 11.2 g/dL — ABNORMAL LOW (ref 12.0–16.0)
Total hemoglobin: 11.9 g/dL — ABNORMAL LOW (ref 12.0–16.0)

## 2023-11-29 LAB — BASIC METABOLIC PANEL WITH GFR
Anion gap: 11 (ref 5–15)
BUN: 29 mg/dL — ABNORMAL HIGH (ref 8–23)
CO2: 22 mmol/L (ref 22–32)
Calcium: 8.8 mg/dL — ABNORMAL LOW (ref 8.9–10.3)
Chloride: 102 mmol/L (ref 98–111)
Creatinine, Ser: 1.53 mg/dL — ABNORMAL HIGH (ref 0.61–1.24)
GFR, Estimated: 49 mL/min — ABNORMAL LOW (ref >60)
Glucose, Bld: 105 mg/dL — ABNORMAL HIGH (ref 70–99)
Potassium: 4.4 mmol/L (ref 3.5–5.1)
Sodium: 135 mmol/L (ref 135–145)

## 2023-11-29 LAB — DIGOXIN LEVEL: Digoxin Level: 0.6 ng/mL — ABNORMAL LOW (ref 0.8–2.0)

## 2023-11-29 MED ORDER — FUROSEMIDE 10 MG/ML IJ SOLN
80.0000 mg | Freq: Once | INTRAMUSCULAR | Status: AC
  Administered 2023-11-29: 80 mg via INTRAVENOUS
  Filled 2023-11-29: qty 8

## 2023-11-29 MED ORDER — POTASSIUM CHLORIDE CRYS ER 20 MEQ PO TBCR
20.0000 meq | EXTENDED_RELEASE_TABLET | Freq: Once | ORAL | Status: AC
  Administered 2023-11-29: 20 meq via ORAL
  Filled 2023-11-29: qty 1

## 2023-11-29 MED ORDER — FUROSEMIDE 10 MG/ML IJ SOLN
80.0000 mg | Freq: Once | INTRAMUSCULAR | Status: AC
Start: 2023-11-29 — End: 2023-11-29
  Administered 2023-11-29: 80 mg via INTRAVENOUS
  Filled 2023-11-29: qty 8

## 2023-11-29 MED ORDER — DOBUTAMINE-DEXTROSE 4-5 MG/ML-% IV SOLN
1.0000 ug/kg/min | INTRAVENOUS | Status: DC
  Administered 2023-11-29 – 2023-12-01 (×2): 2.5 ug/kg/min via INTRAVENOUS
  Filled 2023-11-29 (×3): qty 250

## 2023-11-29 NOTE — Care Management Important Message (Signed)
 Important Message  Patient Details  Name: Ricky Bailey MRN: [id number] Date of Birth: 08/08/1955   Important Message Given:  Yes - Medicare IM     Felix Host 11/29/2023, 12:53 PM

## 2023-11-29 NOTE — Progress Notes (Signed)
 Occupational Therapy Treatment Patient Details Name: Ricky Bailey MRN: [id number] DOB: 08/26/1955 Today's Date: 11/29/2023   History of present illness 68 y.o. male presents to [facility] 11/20/23 due to chest pain, code STEMI. Emergent PCI which was unsuccessful 2/2 occluded large RCA. 5/15 progressive R HF, ECG changes, and worsening hypotension w/ urgent R/L heart cath. Pt with new ruptured plaque in LAD, s/p stent. PMHx: HTN, obesity   OT comments  Pt up in chair. Mobilized with supervision and RW. Overall functioning at up to a supervision level in ADLs. Educated in the 5 Ps of energy conservation and pursed lip breathing with exertion. Pt has a 3 in 1 and rollator already delivered, daughter bought a tub seat for pt. Educated in benefits of rollator to assist in building endurance and multiple uses of 3 in 1. Pt with chief complaint of shortness of breath and fatigue. Plans to continue with cardiac rehab after home health PT discharges.       If plan is discharge home, recommend the following:  Assistance with cooking/housework;Assist for transportation;Help with stairs or ramp for entrance   Equipment Recommendations   (daughter has purchased a tub seat)    Recommendations for Other Services      Precautions / Restrictions Precautions Precautions: Fall Recall of Precautions/Restrictions: Intact Precaution/Restrictions Comments: watch O2 Restrictions Weight Bearing Restrictions Per Provider Order: No       Mobility Bed Mobility               General bed mobility comments: in chair    Transfers Overall transfer level: Needs assistance Equipment used: Rolling walker (2 wheels) Transfers: Sit to/from Stand Sit to Stand: Supervision                 Balance Overall balance assessment: Needs assistance Sitting-balance support: Feet supported Sitting balance-Leahy Scale: Good     Standing balance support: No upper extremity supported Standing balance-Leahy Scale:  Fair Standing balance comment: able to stand statically with no UE support, RW for gait                           ADL either performed or assessed with clinical judgement   ADL Overall ADL's : Needs assistance/impaired     Grooming: Standing;Supervision/safety       Lower Body Bathing: Sit to/from stand;Supervison/ safety       Lower Body Dressing: Sit to/from stand;Supervision/safety   Toilet Transfer: Contact guard assist;Ambulation;Rolling walker (2 wheels);Supervision/safety   Toileting- Clothing Manipulation and Hygiene: Modified independent;Sitting/lateral lean       Functional mobility during ADLs: Rolling walker (2 wheels);Supervision/safety      Extremity/Trunk Assessment              Occupational psychologist Communication: No apparent difficulties   Cognition Arousal: Alert Behavior During Therapy: WFL for tasks assessed/performed Cognition: No apparent impairments                               Following commands: Intact        Cueing   Cueing Techniques: Verbal cues  Exercises      Shoulder Instructions       General Comments      Pertinent Vitals/ Pain       Pain Assessment Pain Assessment: No/denies pain  Home Living  Prior Functioning/Environment              Frequency  Min 2X/week        Progress Toward Goals  OT Goals(current goals can now be found in the care plan section)  Progress towards OT goals: Progressing toward goals  Acute Rehab OT Goals OT Goal Formulation: With patient Time For Goal Achievement: 12/10/23 Potential to Achieve Goals: Good  Plan      Co-evaluation                 AM-PAC OT "6 Clicks" Daily Activity     Outcome Measure   Help from another person eating meals?: None Help from another person taking care of personal grooming?: A Little Help from  another person toileting, which includes using toliet, bedpan, or urinal?: A Little Help from another person bathing (including washing, rinsing, drying)?: A Little Help from another person to put on and taking off regular upper body clothing?: None Help from another person to put on and taking off regular lower body clothing?: A Little 6 Click Score: 20    End of Session Equipment Utilized During Treatment: Rolling walker (2 wheels);Gait belt  OT Visit Diagnosis: Unsteadiness on feet (R26.81);Other (comment) (decreased activity tolerance)   Activity Tolerance Patient tolerated treatment well   Patient Left in chair;with call bell/phone within reach;with family/visitor present   Nurse Communication          Time: 1610-9604 OT Time Calculation (min): 22 min  Charges: OT General Charges $OT Visit: 1 Visit OT Treatments $Self Care/Home Management : 23-37 mins  Avanell Leigh, OTR/L Acute Rehabilitation Services Office: [phone]   Jonette Nestle 11/29/2023, 12:44 PM

## 2023-11-29 NOTE — Progress Notes (Signed)
 Mobility Specialist Progress Note;   11/29/23 1102  Mobility  Activity Ambulated with assistance in hallway  Level of Assistance Standby assist, set-up cues, supervision of patient - no hands on  Assistive Device Front wheel walker  Distance Ambulated (ft) 300 ft  Activity Response Tolerated well  Mobility Referral Yes  Mobility visit 1 Mobility  Mobility Specialist Start Time (ACUTE ONLY) 1102  Mobility Specialist Stop Time (ACUTE ONLY) 1128  Mobility Specialist Time Calculation (min) (ACUTE ONLY) 26 min   Pt agreeable to mobility. Required no physical assistance during ambulation, SV. Ambulated on RA. SPO2 would desat to 70s (not sure accurate pleth reading) requiring frequent rest breaks. SPO2 would recover to 89%> w/ PLB. No c/o when asked. Pt returned back to chair with all needs met-- call bell in reach. Daughter in room.   Janit Meline Mobility Specialist Please contact via SecureChat or [employer] [phone]

## 2023-11-29 NOTE — Plan of Care (Signed)
  Problem: Clinical Measurements: Goal: Respiratory complications will improve Outcome: Progressing Goal: Cardiovascular complication will be avoided Outcome: Progressing   Problem: Activity: Goal: Risk for activity intolerance will decrease Outcome: Progressing   Problem: Nutrition: Goal: Adequate nutrition will be maintained Outcome: Progressing   Problem: Safety: Goal: Ability to remain free from injury will improve Outcome: Progressing   Problem: Cardiovascular: Goal: Ability to achieve and maintain adequate cardiovascular perfusion will improve Outcome: Progressing Goal: Vascular access site(s) Level 0-1 will be maintained Outcome: Progressing

## 2023-11-29 NOTE — Progress Notes (Signed)
 CARDIAC REHAB PHASE I     Pt resting in bed, feeling well today. Only complaint, continued SOB and fatigue with activity.  Encouraged mobility and oob to chair as tolerated. Agreeable to sitting in chair. Pt tolerated well with no dizziness, CP and mild SOB. Call bell and bedside table in reach. Pt spirits appear improved today. Daughter at bedside, able to address several questions and concerns. Will continue to follow.    [id number] Ronny Colas, RN BSN 11/29/2023 11:35 AM

## 2023-11-29 NOTE — Progress Notes (Addendum)
 Advanced Heart Failure Rounding Note  Cardiologist: None   Chief Complaint: STEMI  Subjective:    68 y/o male admitted with RCA STEMI and VF arrest. Unable to open RCA. Subsequently developed recurrent CP and ECG changes. Re-look cath 5/16 with new ruptured plaque in LAD -> stent. RHC with evidence of severe RV failure/shock  Echo EF 35-40% RV moderately HK  CO-OX 57 today with Fick CI of 2 off DBA.  CVP 7  Still pretty short of breath today. Gets winded with light activity.    Objective:    Weight Range: 120.2 kg Body mass index is 36.96 kg/m.   Vital Signs:   Temp:  [97.5 F (36.4 C)-98.7 F (37.1 C)] 98.2 F (36.8 C) (05/22 0319) Pulse Rate:  [77-98] 80 (05/22 0319) Resp:  [16-36] 19 (05/22 0319) BP: (97-127)/(72-102) 113/92 (05/22 0319) SpO2:  [85 %-98 %] 98 % (05/22 0319) Weight:  [120.2 kg] 120.2 kg (05/21 1352) Last BM Date : 11/28/23  Weight change: Filed Weights   11/25/23 0645 11/26/23 0700 11/28/23 1352  Weight: 118.3 kg 121 kg 120.2 kg   Intake/Output:  Intake/Output Summary (Last 24 hours) at 11/29/2023 0743 Last data filed at 11/29/2023 0300 Gross per 24 hour  Intake 240 ml  Output 1550 ml  Net -1310 ml   Physical Exam   CVP 7  General:  Fatigued appearing Neck: Jvp 8-9 Cor: Regular rate & rhythm. No rubs, gallops or murmurs. Lungs: crackles in bases Abdomen: obese, soft, nontender, nondistended.  Extremities: no edema, RUE PICC Neuro: alert & orientedx3. Affect pleasant    Telemetry   SR 70s-80s  Labs    CBC No results for input(s): "WBC", "NEUTROABS", "HGB", "HCT", "MCV", "PLT" in the last 72 hours.  Basic Metabolic Panel Recent Labs    16/10/96 0521 11/29/23 0518  NA 134* 135  K 4.0 4.4  CL 104 102  CO2 23 22  GLUCOSE 113* 105*  BUN 28* 29*  CREATININE 1.39* 1.53*  CALCIUM  8.4* 8.8*   Liver Function Tests No results for input(s): "AST", "ALT", "ALKPHOS", "BILITOT", "PROT", "ALBUMIN" in the last 72  hours.  Hemoglobin A1C No results for input(s): "HGBA1C" in the last 72 hours.  Fasting Lipid Panel No results for input(s): "CHOL", "HDL", "LDLCALC", "TRIG", "CHOLHDL", "LDLDIRECT" in the last 72 hours.  Imaging   No results found.   Medications:    Scheduled Medications:  allopurinol   150 mg Oral Daily   aspirin  EC  81 mg Oral Daily   Chlorhexidine  Gluconate Cloth  6 each Topical Daily   dapagliflozin propanediol  10 mg Oral Daily   digoxin   0.125 mg Oral Daily   finasteride   5 mg Oral Daily   heparin   5,000 Units Subcutaneous Q8H   losartan  25 mg Oral Daily   polyethylene glycol  17 g Oral Daily   prasugrel   10 mg Oral Daily   rosuvastatin   40 mg Oral Daily   senna-docusate  2 tablet Oral QHS   sodium chloride  flush  10-40 mL Intracatheter Q12H   spironolactone   25 mg Oral Daily   Infusions:   PRN Medications: acetaminophen , magnesium  hydroxide, ondansetron  (ZOFRAN ) IV, mouth rinse, oxyCODONE , sodium chloride  flush, traZODone   Patient Profile   Ricky Bailey is a 68 y.o. male with depression, HTN, HLD, obesity, BPH, and OSA. Admitted with STEMI  Assessment/Plan   CAD with acute inferior STEMI and RV infarct - LHC 5/13: thrombotic occlusion of large caliber RCA s/p balloon angio and  aspiration thrombectomy. Minimal disease in LAD and LCX. C/B VF arrest in cath lab. - Repeat LHC 5/14: unsuccessful PCI. 99% residual stenosis  - Recurrent CP and ECG changes on 5/16 -> cath with 99% LAD stenosis due to new ruptured plaque -> DES  RHC with severe RV failure shock - denies current CP  - Continue ASA 81 mg daily + Effient  10 mg daily  - Continue Crestor  40 mg daily   2. Cardiogenic shock due to STEMI and RV infarct - echo 11/22/23 EF 60-65 - echo 5/16 EF 35-40% RV moderately down - Repeat Limited Echo 5/20, EF 30-35%, RV mod reduced  - Now off inotropes/ pressors. CO-OX 57% with estimated Fick CI of 2.  - CVP 7, JVP around 8-9. Continues with quite a bit of  dyspnea. Give lasix  80 mg IV X 1. Some concern that his symptoms could be d/t persistent low output HF. - Continue Farxiga 10 mg daily  - Continue digoxin  0.125 mcg - Continue losartan 25 mg daily. BP too soft currently for Entresto   - Continue spironolactone   25 mg daily  - LifeVest has been delivered  3. VF arrest PVCs/NSVT - in the setting of acute STEMI and failed revasc attempts - Amio stopped 5/17. Now off DBA - no further arrhthymias - LifeVest as above.  4. Ascending Aortic Aneurysm - measuring 4.2 mm - stable  5. AKI  - Scr  1.27>1.6>1.4>1.3 today  - follow BMP   6. Fevers - Noted 5/18, Tmax 100.6 F. Now resolved. Mild leukocytosis resolved.  - BCx negative.   HH PT/OT set up for discharge  He was assessed for home O2. Desaturations noted on RA with ambulation, but recovered quickly (15-20 seconds) with PLB.    Length of Stay: 9  FINCH, LINDSAY N, PA-C  11/29/2023, 7:43 AM  Advanced Heart Failure Team Pager [phone] (M-F; 7a - 5p)  Please contact [facility] for night-coverage after hours (5p -7a ) and weekends on [email or web address]  Patient seen and examined with the above-signed Advanced Practice Provider and/or Housestaff. I personally reviewed laboratory data, imaging studies and relevant notes. I independently examined the patient and formulated the important aspects of the plan. I have edited the note to reflect any of my changes or salient points. I have personally discussed the plan with the patient and/or family.  Off dobutamine . Remains tenuous. SOB with minimal activity. Co-ox marginal.  CVP 7.  General:  Sitting up in chair. No resp difficulty HEENT: normal Neck: supple. JVP 7. Carotids 2+ bilat; no bruits. No lymphadenopathy or thryomegaly appreciated. Cor: PMI nondisplaced. Regular rate & rhythm. No rubs, gallops or murmurs. Lungs: clear Abdomen: soft, nontender, nondistended. No hepatosplenomegaly. No bruits or masses. Good bowel  sounds. Extremities: no cyanosis, clubbing, rash, edema Neuro: alert & orientedx3, cranial nerves grossly intact. moves all 4 extremities w/o difficulty. Affect pleasant  Remains tenuous with biventricular HF and exertional desaturations, Agree with diuresis today. But need to be careful not to overdiurese in setting of RV dysfunction. Continue GDMT. Not ready for d/c yet.  Jules Oar, MD  8:26 AM

## 2023-11-29 NOTE — Progress Notes (Signed)
 Feels marginally better after 80 IV lasix  X 2. Still short of breath and fatigued with prolonged conversation and little physical exertion.   Extremities are cool on exam.   Recheck CO-OX to establish baseline.  Plan to restart dobutamine  at 2.5 mcg/kg/min.

## 2023-11-30 DIAGNOSIS — I2119 ST elevation (STEMI) myocardial infarction involving other coronary artery of inferior wall: Secondary | ICD-10-CM | POA: Diagnosis not present

## 2023-11-30 LAB — CULTURE, BLOOD (ROUTINE X 2)
Culture: NO GROWTH
Culture: NO GROWTH

## 2023-11-30 LAB — COOXEMETRY PANEL
Carboxyhemoglobin: 2 % — ABNORMAL HIGH (ref 0.5–1.5)
Carboxyhemoglobin: 1.8 % — ABNORMAL HIGH (ref 0.5–1.5)
Methemoglobin: <0.7 % (ref 0.0–1.5)
Methemoglobin: <0.7 % (ref 0.0–1.5)
O2 Saturation: 99.9 %
O2 Saturation: 66.1 %
Total hemoglobin: 11.3 g/dL — ABNORMAL LOW (ref 12.0–16.0)
Total hemoglobin: 11.3 g/dL — ABNORMAL LOW (ref 12.0–16.0)

## 2023-11-30 LAB — BASIC METABOLIC PANEL WITH GFR
Anion gap: 11 (ref 5–15)
BUN: 33 mg/dL — ABNORMAL HIGH (ref 8–23)
CO2: 23 mmol/L (ref 22–32)
Calcium: 8.3 mg/dL — ABNORMAL LOW (ref 8.9–10.3)
Chloride: 100 mmol/L (ref 98–111)
Creatinine, Ser: 1.74 mg/dL — ABNORMAL HIGH (ref 0.61–1.24)
GFR, Estimated: 42 mL/min — ABNORMAL LOW (ref >60)
Glucose, Bld: 104 mg/dL — ABNORMAL HIGH (ref 70–99)
Potassium: 4.4 mmol/L (ref 3.5–5.1)
Sodium: 134 mmol/L — ABNORMAL LOW (ref 135–145)

## 2023-11-30 MED ORDER — TORSEMIDE 20 MG PO TABS
40.0000 mg | ORAL_TABLET | Freq: Every day | ORAL | Status: DC
  Administered 2023-12-01 – 2023-12-02 (×2): 40 mg via ORAL
  Filled 2023-11-30 (×2): qty 2

## 2023-11-30 NOTE — Plan of Care (Signed)
  Problem: Education: Goal: Knowledge of General Education information will improve Description Including pain rating scale, medication(s)/side effects and non-pharmacologic comfort measures Outcome: Progressing   Problem: Health Behavior/Discharge Planning: Goal: Ability to manage health-related needs will improve Outcome: Progressing   Problem: Clinical Measurements: Goal: Ability to maintain clinical measurements within normal limits will improve Outcome: Progressing Goal: Will remain free from infection Outcome: Progressing Goal: Diagnostic test results will improve Outcome: Progressing Goal: Respiratory complications will improve Outcome: Progressing Goal: Cardiovascular complication will be avoided Outcome: Progressing   Problem: Activity: Goal: Risk for activity intolerance will decrease Outcome: Progressing   Problem: Nutrition: Goal: Adequate nutrition will be maintained Outcome: Progressing   Problem: Coping: Goal: Level of anxiety will decrease Outcome: Progressing   Problem: Safety: Goal: Ability to remain free from injury will improve Outcome: Progressing   Problem: Skin Integrity: Goal: Risk for impaired skin integrity will decrease Outcome: Progressing   Problem: Education: Goal: Understanding of CV disease, CV risk reduction, and recovery process will improve Outcome: Progressing Goal: Individualized Educational Video(s) Outcome: Progressing   Problem: Activity: Goal: Ability to return to baseline activity level will improve Outcome: Progressing   Problem: Cardiovascular: Goal: Ability to achieve and maintain adequate cardiovascular perfusion will improve Outcome: Progressing Goal: Vascular access site(s) Level 0-1 will be maintained Outcome: Progressing   Problem: Health Behavior/Discharge Planning: Goal: Ability to safely manage health-related needs after discharge will improve Outcome: Progressing

## 2023-11-30 NOTE — Progress Notes (Signed)
 REDS Clip  READING (normal 20-35%) = 25%  CHEST RULER (in) = 45 Clip Station = D  Result provided to [place] Lee, NP.  Jerilyn Monte, PharmD, BCPS Heart Failure Stewardship Pharmacist Phone [phone]

## 2023-11-30 NOTE — Progress Notes (Addendum)
 Advanced Heart Failure Rounding Note  Cardiologist: None  Chief Complaint: Biventricular Heart Failure Subjective:    68 y/o male admitted with RCA STEMI and VF arrest. Unable to open RCA. Subsequently developed recurrent CP and ECG changes. Re-look cath 5/16 with new ruptured plaque in LAD -> stent. RHC with evidence of severe RV failure/shock.  Coox in mid-50s yesterday. Placed back on DBA 2.5. Coox inaccurate this morning, will resend. CVP 9. 2.7L UOP (net neg 1.5L). Weight down 2 lbs.  sCr up with diuresis 1.39>1.53>1.74  Sitting up in bed, eating breakfast. Mild shortness of breath with conversation. States that its much better than yesterday. No CP.   Objective:    Weight Range: 120.2 kg Body mass index is 36.96 kg/m.   Vital Signs:   Temp:  [97.8 F (36.6 C)-98.9 F (37.2 C)] 98.7 F (37.1 C) (05/23 0323) Pulse Rate:  [76-90] 84 (05/23 0323) Resp:  [18-20] 20 (05/23 0323) BP: (99-119)/(66-89) 116/82 (05/23 0323) SpO2:  [95 %-98 %] 95 % (05/23 0323) Last BM Date : 11/28/23  Weight change: Filed Weights   11/25/23 0645 11/26/23 0700 11/28/23 1352  Weight: 118.3 kg 121 kg 120.2 kg   Intake/Output:  Intake/Output Summary (Last 24 hours) at 11/30/2023 0729 Last data filed at 11/30/2023 0600 Gross per 24 hour  Intake 1200 ml  Output 2710 ml  Net -1510 ml   Physical Exam   CVP 10 General: Appear mildly short of breath with conversation Cardiac: JVP flat. S1 and S2 present. No murmurs or rub. Resp: Lung sounds clear and equal B/L Extremities: Warm and dry.  No peripheral edema.  Neuro: Alert and oriented x3. Affect pleasant.  Lines/Devices:  RUE PICC  Telemetry   SR in 80s (personally reviewed)  Labs    CBC No results for input(s): "WBC", "NEUTROABS", "HGB", "HCT", "MCV", "PLT" in the last 72 hours.  Basic Metabolic Panel Recent Labs    62/95/28 0521 11/29/23 0518  NA 134* 135  K 4.0 4.4  CL 104 102  CO2 23 22  GLUCOSE 113* 105*  BUN 28*  29*  CREATININE 1.39* 1.53*  CALCIUM  8.4* 8.8*   Imaging   No results found.  Medications:    Scheduled Medications:  allopurinol   150 mg Oral Daily   aspirin  EC  81 mg Oral Daily   Chlorhexidine  Gluconate Cloth  6 each Topical Daily   dapagliflozin propanediol  10 mg Oral Daily   digoxin   0.125 mg Oral Daily   finasteride   5 mg Oral Daily   heparin   5,000 Units Subcutaneous Q8H   losartan  25 mg Oral Daily   polyethylene glycol  17 g Oral Daily   prasugrel   10 mg Oral Daily   rosuvastatin   40 mg Oral Daily   senna-docusate  2 tablet Oral QHS   sodium chloride  flush  10-40 mL Intracatheter Q12H   spironolactone   25 mg Oral Daily   Infusions:  DOBUTamine  2.5 mcg/kg/min (11/29/23 1807)    PRN Medications: acetaminophen , magnesium  hydroxide, ondansetron  (ZOFRAN ) IV, mouth rinse, oxyCODONE , sodium chloride  flush, traZODone   Patient Profile   Ricky Bailey is a 68 y.o. male with depression, HTN, HLD, obesity, BPH, and OSA. Admitted with STEMI  Assessment/Plan   CAD with acute inferior STEMI and RV infarct - LHC 5/13: thrombotic occlusion of large caliber RCA s/p balloon angio and aspiration thrombectomy. Minimal disease in LAD and LCX. C/B VF arrest in cath lab. - Repeat LHC 5/14: unsuccessful PCI. 99% residual stenosis  -  Recurrent CP and ECG changes on 5/16 -> cath with 99% LAD stenosis due to new ruptured plaque -> DES  RHC with severe RV failure shock - no  s/s angina - Continue ASA 81 mg  + Effient  10 mg + crestor  40 mg daily  2. Cardiogenic shock due to STEMI and RV infarct - echo 11/22/23 EF 60-65 - echo 5/16 EF 35-40% RV moderately down - Repeat Limited Echo 5/20, EF 30-35%, RV mod reduced  - Coox to mid 50s off DBA, resumed at 2.5.   - Repeat coox pending this morning. CVP 9. sCr bump with diuresis 1.39>1.53>1.74. Urine dark yellow.  - Remains mildly SOB, will check ReDs today and plan to start PO diuresis likely tomorrow or this afternoon.  - Continue Farxiga  10 mg daily  - Continue digoxin  0.125 mcg daily - Continue losartan 25 mg daily. BP too soft currently for Entresto   - Continue spironolactone   25 mg daily  - LifeVest has been delivered  3. VF arrest PVCs/NSVT - in the setting of acute STEMI and failed revasc attempts - Amio stopped 5/17 - no further arrhthymias - LifeVest as above.  4. Ascending Aortic Aneurysm - measuring 4.2 mm - stable  5. AKI  - sCr 1.53>1.74 with continued diuresis  6. Fevers - Tmax 100.6 F 5/18. Now resolved. Mild leukocytosis resolved.  - BCx negative.   HH PT/OT set up for discharge  He was assessed for home O2. Desaturations noted on RA with ambulation, but recovered quickly (15-20 seconds) with PLB.   Length of Stay: 10  Ricky Lee, NP  11/30/2023, 7:29 AM  Advanced Heart Failure Team Pager [phone] (M-F; 7a - 5p)  Please contact [facility] for night-coverage after hours (5p -7a ) and weekends on [email or web address]  Patient seen and examined with the above-signed Advanced Practice Provider and/or Housestaff. I personally reviewed laboratory data, imaging studies and relevant notes. I independently examined the patient and formulated the important aspects of the plan. I have edited the note to reflect any of my changes or salient points. I have personally discussed the plan with the patient and/or family.  DBA restarted yesterday for low output symptoms (co-ox 58%). Now back on DBA. Co-ox pending.   CVP 8-9. Scr up a bit.  Feels much better.   General:  Sitting up in bed . No resp difficulty HEENT: normal Neck: supple. JVP 8-9 Carotids 2+ bilat; no bruits. No lymphadenopathy or thryomegaly appreciated. Cor: PMI nondisplaced. Regular rate & rhythm. No rubs, gallops or murmurs. Lungs: clear Abdomen: soft, nontender, nondistended. No hepatosplenomegaly. No bruits or masses. Good bowel sounds. Extremities: no cyanosis, clubbing, rash, edema Neuro: alert & orientedx3, cranial nerves grossly  intact. moves all 4 extremities w/o difficulty. Affect pleasant  He is back on DBA in setting of biventricular dysfunction. He is acting dry but CVP 8-9. May be as low as we can get him with RV infarct.  Will hold diuretics today. Continue DBA throughout weekend. Will plan DBA wean again on Monday.   Ricky Oar, MD  8:24 AM  Addendum: ReDS 27% (low lung water). CVP likely as low as we can get it insetting of RV infarct. Continue to hold diuretics. As Scr improves would add back torsemide 20 daily.   Ricky Oar, MD  12:33 PM

## 2023-11-30 NOTE — Progress Notes (Signed)
 Mobility Specialist Progress Note;   11/30/23 1032  Mobility  Activity Transferred from bed to chair  Level of Assistance Standby assist, set-up cues, supervision of patient - no hands on  Assistive Device Front wheel walker  Distance Ambulated (ft) 5 ft  Activity Response Tolerated well  Mobility Referral Yes  Mobility visit 1 Mobility  Mobility Specialist Start Time (ACUTE ONLY) 1032  Mobility Specialist Stop Time (ACUTE ONLY) 1040  Mobility Specialist Time Calculation (min) (ACUTE ONLY) 8 min   Pt agreeable to mobility. Required no physical assistance during transfer, SV. VSS on RA and no c/o when asked. Pt left in chair with all needs met, call bell in reach.   Janit Meline Mobility Specialist Please contact via SecureChat or [employer] [phone]

## 2023-11-30 NOTE — Progress Notes (Signed)
 CARDIAC REHAB PHASE I    HF education including low sodium diet, HF booklet and CRP2 reviewed. All questions and concerns addressed. Referral for CRP2 sent to [facility]. Will continue to follow.   [id number] Ronny Colas, RN BSN 11/30/2023 10:18 AM

## 2023-11-30 NOTE — Progress Notes (Signed)
 Physical Therapy Treatment Patient Details Name: Ricky Bailey MRN: [id number] DOB: [birth year] Today's Date: 11/30/2023   History of Present Illness 68 y.o. male presents to [facility] 11/20/23 due to chest pain, code STEMI. Emergent PCI which was unsuccessful 2/2 occluded large RCA. 5/15 progressive R HF, ECG changes, and worsening hypotension w/ urgent R/L heart cath. Pt with new ruptured plaque in LAD, s/p stent. PMHx: HTN, obesity    PT Comments  Patient resting in recliner at start of session and agreeable to mobilize with therapy. Pt reports having a rollator at home and transfers/gait continued with rollator this visit. Pt completed sit<>stand with cues for rollator brake management and supervision for safety with rise and lower. Cues at start for proximity to walker and pt maintained throughout gait with CGA for safety. HR stable in 90's-100's with ambulation and pt SpO2 remained >92% throughout with occasional drop to mid/high 80's when gripping walker handle. Suspect pt is not desaturating significant amount as not symptomatic. 2 standing breaks performed during gait to assess SpO2 more adequately. EOS pt agreeable to return to chair and remain OOB for meal/lunch. Will continue to progress pt as able during acute stay.     If plan is discharge home, recommend the following: A little help with walking and/or transfers;A little help with bathing/dressing/bathroom;Assistance with cooking/housework;Assist for transportation;Help with stairs or ramp for entrance   Can travel by private vehicle        Equipment Recommendations   (pt has Rollator at home)    Recommendations for Other Services       Precautions / Restrictions Precautions Precautions: Fall Recall of Precautions/Restrictions: Intact Precaution/Restrictions Comments: watch O2 Restrictions Weight Bearing Restrictions Per Provider Order: No     Mobility  Bed Mobility               General bed mobility comments: OOB in  recliner    Transfers Overall transfer level: Needs assistance Equipment used: Rollator (4 wheels) Transfers: Sit to/from Stand Sit to Stand: Supervision           General transfer comment: supervision for safety, pt using UE's to power up and for safe reach back to control lowering.    Ambulation/Gait Ambulation/Gait assistance: Contact guard assist Gait Distance (Feet): 400 Feet Assistive device: Rollator (4 wheels) Gait Pattern/deviations: Step-through pattern, Decreased stride length, Trunk flexed Gait velocity: decr     General Gait Details: pt required cues at start for safe proximity/management of rollator and no overt LOB noted.   Stairs             Wheelchair Mobility     Tilt Bed    Modified Rankin (Stroke Patients Only)       Balance Overall balance assessment: Needs assistance Sitting-balance support: Feet supported Sitting balance-Leahy Scale: Good     Standing balance support: No upper extremity supported Standing balance-Leahy Scale: Fair Standing balance comment: able to stand statically with no UE support, RW for gait                            Communication Communication Communication: No apparent difficulties  Cognition Arousal: Alert Behavior During Therapy: WFL for tasks assessed/performed   PT - Cognitive impairments: No apparent impairments                         Following commands: Intact      Cueing Cueing Techniques: Verbal cues  Exercises  General Comments        Pertinent Vitals/Pain Pain Assessment Pain Assessment: No/denies pain Pain Intervention(s): Monitored during session    Home Living                          Prior Function            PT Goals (current goals can now be found in the care plan section) Acute Rehab PT Goals Patient Stated Goal: to get better and go home PT Goal Formulation: With patient/family Time For Goal Achievement: 12/09/23 Potential to  Achieve Goals: Good Progress towards PT goals: Progressing toward goals    Frequency    Min 2X/week      PT Plan      Co-evaluation              AM-PAC PT "6 Clicks" Mobility   Outcome Measure  Help needed turning from your back to your side while in a flat bed without using bedrails?: A Little Help needed moving from lying on your back to sitting on the side of a flat bed without using bedrails?: A Little Help needed moving to and from a bed to a chair (including a wheelchair)?: A Little Help needed standing up from a chair using your arms (e.g., wheelchair or bedside chair)?: A Little Help needed to walk in hospital room?: A Little Help needed climbing 3-5 steps with a railing? : A Little 6 Click Score: 18    End of Session Equipment Utilized During Treatment: Gait belt Activity Tolerance: Patient tolerated treatment well Patient left: with call bell/phone within reach;in chair Nurse Communication: Mobility status PT Visit Diagnosis: Unsteadiness on feet (R26.81);Other abnormalities of gait and mobility (R26.89)     Time: 3086-5784 PT Time Calculation (min) (ACUTE ONLY): 28 min  Charges:    $Gait Training: 8-22 mins $Therapeutic Activity: 8-22 mins PT General Charges $$ ACUTE PT VISIT: 1 Visit                     Tish Forge, DPT Acute Rehabilitation Services Office [phone]  11/30/23 12:53 PM

## 2023-12-01 DIAGNOSIS — I2119 ST elevation (STEMI) myocardial infarction involving other coronary artery of inferior wall: Secondary | ICD-10-CM | POA: Diagnosis not present

## 2023-12-01 LAB — COOXEMETRY PANEL
Carboxyhemoglobin: 2.2 % — ABNORMAL HIGH (ref 0.5–1.5)
Methemoglobin: <0.7 % (ref 0.0–1.5)
O2 Saturation: 87.3 %
Total hemoglobin: 11.4 g/dL — ABNORMAL LOW (ref 12.0–16.0)

## 2023-12-01 LAB — BASIC METABOLIC PANEL WITH GFR
Anion gap: 11 (ref 5–15)
BUN: 35 mg/dL — ABNORMAL HIGH (ref 8–23)
CO2: 23 mmol/L (ref 22–32)
Calcium: 8.9 mg/dL (ref 8.9–10.3)
Chloride: 101 mmol/L (ref 98–111)
Creatinine, Ser: 1.62 mg/dL — ABNORMAL HIGH (ref 0.61–1.24)
GFR, Estimated: 46 mL/min — ABNORMAL LOW (ref >60)
Glucose, Bld: 100 mg/dL — ABNORMAL HIGH (ref 70–99)
Potassium: 4.3 mmol/L (ref 3.5–5.1)
Sodium: 135 mmol/L (ref 135–145)

## 2023-12-01 MED ORDER — GUAIFENESIN 200 MG PO TABS
200.0000 mg | ORAL_TABLET | ORAL | Status: DC | PRN
Start: 2023-12-01 — End: 2023-12-05
  Administered 2023-12-05: 200 mg via ORAL
  Filled 2023-12-01 (×2): qty 1

## 2023-12-01 MED ORDER — MENTHOL 3 MG MT LOZG: 1.0000 | LOZENGE | OROMUCOSAL | Status: DC | PRN

## 2023-12-01 MED ORDER — HYDROCORTISONE 1 % EX CREA
1.0000 | TOPICAL_CREAM | Freq: Three times a day (TID) | CUTANEOUS | Status: DC | PRN
  Filled 2023-12-01: qty 28

## 2023-12-01 NOTE — Progress Notes (Addendum)
 CARDIAC REHAB PHASE I   PRE:  Rate/Rhythm: 85 SR    BP: sitting 111/76    SpO2: 98 RA  MODE:  Ambulation: 340 ft   POST:  Rate/Rhythm: 107 ST    BP: sitting 120/82     SpO2: 95 RA  Ambulated without significant c/o. Used RW and standby assist. VSS, to recliner. Pt with cough that he sts he has had PTA. [id number]   German Koller BS, ACSM-CEP 12/01/2023 10:25 AM

## 2023-12-01 NOTE — Progress Notes (Signed)
 During AM assessment, hematoma noted at right groin cath site.  I spoke to Dellis Fermo, NP, states she and Dr Carolynne Citron are aware.  No new orders received.

## 2023-12-01 NOTE — Plan of Care (Signed)
  Problem: Education: Goal: Knowledge of General Education information will improve Description Including pain rating scale, medication(s)/side effects and non-pharmacologic comfort measures Outcome: Progressing   Problem: Health Behavior/Discharge Planning: Goal: Ability to manage health-related needs will improve Outcome: Progressing   Problem: Clinical Measurements: Goal: Ability to maintain clinical measurements within normal limits will improve Outcome: Progressing Goal: Will remain free from infection Outcome: Progressing Goal: Diagnostic test results will improve Outcome: Progressing Goal: Respiratory complications will improve Outcome: Progressing Goal: Cardiovascular complication will be avoided Outcome: Progressing   Problem: Activity: Goal: Risk for activity intolerance will decrease Outcome: Progressing   Problem: Nutrition: Goal: Adequate nutrition will be maintained Outcome: Progressing   Problem: Coping: Goal: Level of anxiety will decrease Outcome: Progressing   Problem: Safety: Goal: Ability to remain free from injury will improve Outcome: Progressing   Problem: Skin Integrity: Goal: Risk for impaired skin integrity will decrease Outcome: Progressing   Problem: Education: Goal: Understanding of CV disease, CV risk reduction, and recovery process will improve Outcome: Progressing Goal: Individualized Educational Video(s) Outcome: Progressing   Problem: Activity: Goal: Ability to return to baseline activity level will improve Outcome: Progressing   Problem: Cardiovascular: Goal: Ability to achieve and maintain adequate cardiovascular perfusion will improve Outcome: Progressing Goal: Vascular access site(s) Level 0-1 will be maintained Outcome: Progressing   Problem: Health Behavior/Discharge Planning: Goal: Ability to safely manage health-related needs after discharge will improve Outcome: Progressing

## 2023-12-01 NOTE — Progress Notes (Signed)
 Mobility Specialist Progress Note:   12/01/23 1430  Mobility  Activity Ambulated with assistance in hallway  Level of Assistance Standby assist, set-up cues, supervision of patient - no hands on  Assistive Device Four wheel walker  Distance Ambulated (ft) 300 ft  Activity Response Tolerated well  Mobility Referral Yes  Mobility visit 1 Mobility  Mobility Specialist Start Time (ACUTE ONLY) 1430  Mobility Specialist Stop Time (ACUTE ONLY) 1445  Mobility Specialist Time Calculation (min) (ACUTE ONLY) 15 min   Pt agreeable to mobility session. Required no physical assistance throughout ambulation with rollator. Pt with unreliable pleth majority of session, however when reliable, SpO2 >90% on RA. Pt back in bed with all needs met.   Oneda Big Mobility Specialist Please contact via SecureChat or  Rehab office at [phone]

## 2023-12-01 NOTE — Progress Notes (Signed)
 Progress Note  Patient Name: Ricky Bailey Date of Encounter: 12/01/2023  Primary Cardiologist: None   Subjective   No chest pain or sob. C/o sore on his right groin.  Inpatient Medications    Scheduled Meds:  allopurinol   150 mg Oral Daily   aspirin  EC  81 mg Oral Daily   Chlorhexidine  Gluconate Cloth  6 each Topical Daily   dapagliflozin propanediol  10 mg Oral Daily   digoxin   0.125 mg Oral Daily   finasteride   5 mg Oral Daily   heparin   5,000 Units Subcutaneous Q8H   losartan  25 mg Oral Daily   polyethylene glycol  17 g Oral Daily   prasugrel   10 mg Oral Daily   rosuvastatin   40 mg Oral Daily   senna-docusate  2 tablet Oral QHS   sodium chloride  flush  10-40 mL Intracatheter Q12H   spironolactone   25 mg Oral Daily   torsemide  40 mg Oral Daily   Continuous Infusions:  DOBUTamine  2.5 mcg/kg/min (11/29/23 1807)   PRN Meds: acetaminophen , magnesium  hydroxide, ondansetron  (ZOFRAN ) IV, mouth rinse, oxyCODONE , sodium chloride  flush, traZODone    Vital Signs    Vitals:   11/30/23 1937 11/30/23 2240 12/01/23 0338 12/01/23 0836  BP: 110/75 119/80 110/77 110/75  Pulse: 82 82 82 88  Resp: 20 18 16 14   Temp: 98.9 F (37.2 C) 98.5 F (36.9 C) 97.9 F (36.6 C) 98.5 F (36.9 C)  TempSrc: Oral Oral Oral Oral  SpO2: 95% 96% 94% 98%  Weight:      Height:        Intake/Output Summary (Last 24 hours) at 12/01/2023 1010 Last data filed at 12/01/2023 0930 Gross per 24 hour  Intake --  Output 875 ml  Net -875 ml   Filed Weights   11/25/23 0645 11/26/23 0700 11/28/23 1352  Weight: 118.3 kg 121 kg 120.2 kg    Telemetry    NSR at 83/min - Personally Reviewed  ECG    none - Personally Reviewed  Physical Exam   GEN: No acute distress.   Neck: No JVD Cardiac: RRR, no murmurs, rubs, or gallops.  Respiratory: Clear to auscultation bilaterally. GI: Soft, nontender, non-distended  MS: No edema; No deformity. Right groin with 3 cm hematoma, hard. Neuro:  Nonfocal   Psych: Normal affect   Labs    Chemistry Recent Labs  Lab 11/29/23 0518 11/30/23 0626 12/01/23 0525  NA 135 134* 135  K 4.4 4.4 4.3  CL 102 100 101  CO2 22 23 23   GLUCOSE 105* 104* 100*  BUN 29* 33* 35*  CREATININE 1.53* 1.74* 1.62*  CALCIUM  8.8* 8.3* 8.9  GFRNONAA 49* 42* 46*  ANIONGAP 11 11 11      Hematology Recent Labs  Lab 11/26/23 0053  WBC 8.1  RBC 3.36*  HGB 10.4*  HCT 31.1*  MCV 92.6  MCH 31.0  MCHC 33.4  RDW 13.6  PLT 205    Cardiac EnzymesNo results for input(s): "TROPONINI" in the last 168 hours. No results for input(s): "TROPIPOC" in the last 168 hours.   BNPNo results for input(s): "BNP", "PROBNP" in the last 168 hours.   DDimer No results for input(s): "DDIMER" in the last 168 hours.   Radiology    No results found.  Cardiac Studies   none  Patient Profile     68 y.o. male with multiple medical problems admitted for STEMI with RV infarction.  Assessment & Plan    STEMI/RV infarction - he continues to  improve clinically on IV dobutamine . Continue likely thru the weekend. Cardiogenic shock - resolved. He is warm.  VF arrest - he has not had any additional arrhythmias. Note plan for a life vest.   For questions or updates, please contact [facility] Please consult [email or web address] for contact info under Cardiology/STEMI.      Signed, Manya Sells, MD  12/01/2023, 10:10 AM

## 2023-12-02 DIAGNOSIS — I2119 ST elevation (STEMI) myocardial infarction involving other coronary artery of inferior wall: Secondary | ICD-10-CM | POA: Diagnosis not present

## 2023-12-02 LAB — BASIC METABOLIC PANEL WITH GFR
Anion gap: 10 (ref 5–15)
BUN: 35 mg/dL — ABNORMAL HIGH (ref 8–23)
CO2: 22 mmol/L (ref 22–32)
Calcium: 8.9 mg/dL (ref 8.9–10.3)
Chloride: 102 mmol/L (ref 98–111)
Creatinine, Ser: 1.68 mg/dL — ABNORMAL HIGH (ref 0.61–1.24)
GFR, Estimated: 44 mL/min — ABNORMAL LOW (ref >60)
Glucose, Bld: 124 mg/dL — ABNORMAL HIGH (ref 70–99)
Potassium: 4.3 mmol/L (ref 3.5–5.1)
Sodium: 134 mmol/L — ABNORMAL LOW (ref 135–145)

## 2023-12-02 LAB — COOXEMETRY PANEL
Carboxyhemoglobin: 2 % — ABNORMAL HIGH (ref 0.5–1.5)
Methemoglobin: <0.7 % (ref 0.0–1.5)
O2 Saturation: 64.2 %
Total hemoglobin: 11 g/dL — ABNORMAL LOW (ref 12.0–16.0)

## 2023-12-02 NOTE — Progress Notes (Signed)
 Progress Note  Patient Name: Ricky Bailey Date of Encounter: 12/02/2023  Primary Cardiologist: None   Subjective   No chest pain.   Inpatient Medications    Scheduled Meds:  allopurinol   150 mg Oral Daily   aspirin  EC  81 mg Oral Daily   Chlorhexidine  Gluconate Cloth  6 each Topical Daily   dapagliflozin propanediol  10 mg Oral Daily   digoxin   0.125 mg Oral Daily   finasteride   5 mg Oral Daily   heparin   5,000 Units Subcutaneous Q8H   losartan  25 mg Oral Daily   polyethylene glycol  17 g Oral Daily   prasugrel   10 mg Oral Daily   rosuvastatin   40 mg Oral Daily   senna-docusate  2 tablet Oral QHS   sodium chloride  flush  10-40 mL Intracatheter Q12H   spironolactone   25 mg Oral Daily   torsemide  40 mg Oral Daily   Continuous Infusions:  DOBUTamine  2.5 mcg/kg/min (12/01/23 2238)   PRN Meds: acetaminophen , guaiFENesin , hydrocortisone cream, magnesium  hydroxide, menthol-cetylpyridinium, ondansetron  (ZOFRAN ) IV, mouth rinse, oxyCODONE , sodium chloride  flush, traZODone    Vital Signs    Vitals:   12/02/23 0100 12/02/23 0140 12/02/23 0408 12/02/23 0738  BP:  109/81 115/83 124/77  Pulse: 84 84 86 88  Resp:   18 18  Temp:   98.9 F (37.2 C) 97.7 F (36.5 C)  TempSrc:   Oral Oral  SpO2: 97% 96% 97% 98%  Weight:      Height:        Intake/Output Summary (Last 24 hours) at 12/02/2023 0911 Last data filed at 12/02/2023 0827 Gross per 24 hour  Intake 1497 ml  Output 2300 ml  Net -803 ml   Filed Weights   11/25/23 0645 11/26/23 0700 11/28/23 1352  Weight: 118.3 kg 121 kg 120.2 kg    Telemetry    Nsr at 82/min - Personally Reviewed  ECG    none - Personally Reviewed  Physical Exam   GEN: No acute distress.   Neck: No JVD Cardiac: RRR, no murmurs, rubs, or gallops.  Respiratory: Clear to auscultation bilaterally. GI: Soft, nontender, non-distended  MS: No edema; No deformity. Neuro:  Nonfocal  Psych: Normal affect   Labs    Chemistry Recent  Labs  Lab 11/30/23 0626 12/01/23 0525 12/02/23 0256  NA 134* 135 134*  K 4.4 4.3 4.3  CL 100 101 102  CO2 23 23 22   GLUCOSE 104* 100* 124*  BUN 33* 35* 35*  CREATININE 1.74* 1.62* 1.68*  CALCIUM  8.3* 8.9 8.9  GFRNONAA 42* 46* 44*  ANIONGAP 11 11 10      Hematology Recent Labs  Lab 11/26/23 0053  WBC 8.1  RBC 3.36*  HGB 10.4*  HCT 31.1*  MCV 92.6  MCH 31.0  MCHC 33.4  RDW 13.6  PLT 205    Cardiac EnzymesNo results for input(s): "TROPONINI" in the last 168 hours. No results for input(s): "TROPIPOC" in the last 168 hours.   BNPNo results for input(s): "BNP", "PROBNP" in the last 168 hours.   DDimer No results for input(s): "DDIMER" in the last 168 hours.   Radiology    No results found.  Cardiac Studies   reviewed  Patient Profile     68 y.o. male admitted with STEMI/shock  Assessment & Plan    STEMI/RV infarction - he denies anginal symptoms.  Cardiogenic shock - resolved. He remains on IV dobutamine . Consider weaning tomorrow. Bp stable but a little soft.  Acute  on chronic renal insuff - his creatinine essentially unchanged over the past few days. I suspect that cessation of IV dobutamine  will not help this.  Afib - he is maintaining NSR.   For questions or updates, please contact [facility] Please consult [email or web address] for contact info under Cardiology/STEMI.      Signed, Manya Sells, MD  12/02/2023, 9:11 AM

## 2023-12-02 NOTE — Plan of Care (Signed)
 ?  Problem: Clinical Measurements: ?Goal: Will remain free from infection ?Outcome: Progressing ?  ?

## 2023-12-02 NOTE — Plan of Care (Signed)
  Problem: Education: Goal: Knowledge of General Education information will improve Description: Including pain rating scale, medication(s)/side effects and non-pharmacologic comfort measures Outcome: Progressing   Problem: Health Behavior/Discharge Planning: Goal: Ability to manage health-related needs will improve Outcome: Progressing   Problem: Clinical Measurements: Goal: Ability to maintain clinical measurements within normal limits will improve Outcome: Progressing Goal: Cardiovascular complication will be avoided Outcome: Progressing   Problem: Education: Goal: Understanding of CV disease, CV risk reduction, and recovery process will improve Outcome: Progressing   Problem: Activity: Goal: Ability to return to baseline activity level will improve Outcome: Progressing   Problem: Cardiovascular: Goal: Ability to achieve and maintain adequate cardiovascular perfusion will improve Outcome: Progressing

## 2023-12-03 DIAGNOSIS — I2119 ST elevation (STEMI) myocardial infarction involving other coronary artery of inferior wall: Secondary | ICD-10-CM | POA: Diagnosis not present

## 2023-12-03 LAB — BASIC METABOLIC PANEL WITH GFR
Anion gap: 8 (ref 5–15)
BUN: 36 mg/dL — ABNORMAL HIGH (ref 8–23)
CO2: 22 mmol/L (ref 22–32)
Calcium: 8.9 mg/dL (ref 8.9–10.3)
Chloride: 104 mmol/L (ref 98–111)
Creatinine, Ser: 1.84 mg/dL — ABNORMAL HIGH (ref 0.61–1.24)
GFR, Estimated: 39 mL/min — ABNORMAL LOW (ref >60)
Glucose, Bld: 128 mg/dL — ABNORMAL HIGH (ref 70–99)
Potassium: 4.2 mmol/L (ref 3.5–5.1)
Sodium: 134 mmol/L — ABNORMAL LOW (ref 135–145)

## 2023-12-03 LAB — COOXEMETRY PANEL
Carboxyhemoglobin: 2.3 % — ABNORMAL HIGH (ref 0.5–1.5)
Methemoglobin: 0.9 % (ref 0.0–1.5)
O2 Saturation: 75.3 %
Total hemoglobin: 11.1 g/dL — ABNORMAL LOW (ref 12.0–16.0)

## 2023-12-03 NOTE — Progress Notes (Signed)
 Physical Therapy Treatment Patient Details Name: Ricky Bailey MRN: [id number] DOB: Feb 14, 1956 Today's Date: 12/03/2023   History of Present Illness 68 y.o. male [facility] 11/20/23 due to chest pain, code STEMI. Emergent PCI which was unsuccessful 2/2 occluded large RCA. 5/15 progressive R HF, ECG changes, and worsening hypotension w/ urgent R/L heart cath. Pt with new ruptured plaque in LAD, s/p stent. PMHx: HTN, obesity    PT Comments  Pt supine in bed on entry, reports he would like to go for a walk with therapy. Pt DoE, and O2 saturation on RA with ambulation have improved. SpO2 on RA with ambulation 98%O2, and DoE 2/3. Pt is mod I for bed mobility and transfers and ambulates with RW at a supervision level. Pt reports difficulty with picking his feet up with ambulation. PT provided feed back about standing more upright in RW and looking up and out. Pt reports improvement in foot clearance. D/c plans remain appropriate. PT will continue to follow acutely.     If plan is discharge home, recommend the following: A little help with walking and/or transfers;A little help with bathing/dressing/bathroom;Assistance with cooking/housework;Assist for transportation;Help with stairs or ramp for entrance   Can travel by private vehicle      Yes  Equipment Recommendations   (pt has Rollator at home)       Precautions / Restrictions Precautions Precautions: Fall Recall of Precautions/Restrictions: Intact Precaution/Restrictions Comments: watch O2 Restrictions Weight Bearing Restrictions Per Provider Order: No     Mobility  Bed Mobility Overal bed mobility: Modified Independent Bed Mobility: Supine to Sit     Supine to sit: Modified independent (Device/Increase time) Sit to supine: Modified independent (Device/Increase time)   General bed mobility comments: bed flattened, pt with minor use of bedrail with bed mobility.    Transfers Overall transfer level: Needs  assistance Equipment used: Rollator (4 wheels), Rolling walker (2 wheels) Transfers: Sit to/from Stand Sit to Stand: Modified independent (Device/Increase time)           General transfer comment: good hand placement and power up before reaching to RW.    Ambulation/Gait Ambulation/Gait assistance: Contact guard assist Gait Distance (Feet): 450 Feet Assistive device: Rollator (4 wheels) Gait Pattern/deviations: Step-through pattern, Decreased stride length, Trunk flexed Gait velocity: decr Gait velocity interpretation: 1.31 - 2.62 ft/sec, indicative of limited community ambulator   General Gait Details: pt required cues at start for safe proximity/management of rollator and no overt LOB noted.       Balance Overall balance assessment: Needs assistance Sitting-balance support: Feet supported Sitting balance-Leahy Scale: Good     Standing balance support: No upper extremity supported Standing balance-Leahy Scale: Fair Standing balance comment: able to stand statically with no UE support, RW for gait                            Communication Communication Communication: No apparent difficulties  Cognition Arousal: Alert Behavior During Therapy: WFL for tasks assessed/performed   PT - Cognitive impairments: No apparent impairments                         Following commands: Intact      Cueing Cueing Techniques: Verbal cues     General Comments General comments (skin integrity, edema, etc.): max noted HR with ambulation 110 bpm, SpO2 with ambulation on RA 98%O2      Pertinent Vitals/Pain Pain Assessment Pain Assessment: No/denies pain  PT Goals (current goals can now be found in the care plan section) Acute Rehab PT Goals Patient Stated Goal: to get better and go home PT Goal Formulation: With patient/family Time For Goal Achievement: 12/09/23 Potential to Achieve Goals: Good Progress towards PT goals: Progressing toward goals     Frequency    Min 2X/week       AM-PAC PT "6 Clicks" Mobility   Outcome Measure  Help needed turning from your back to your side while in a flat bed without using bedrails?: A Little Help needed moving from lying on your back to sitting on the side of a flat bed without using bedrails?: A Little Help needed moving to and from a bed to a chair (including a wheelchair)?: A Little Help needed standing up from a chair using your arms (e.g., wheelchair or bedside chair)?: A Little Help needed to walk in hospital room?: A Little Help needed climbing 3-5 steps with a railing? : A Little 6 Click Score: 18    End of Session Equipment Utilized During Treatment: Gait belt Activity Tolerance: Patient tolerated treatment well Patient left: with call bell/phone within reach;in chair Nurse Communication: Mobility status PT Visit Diagnosis: Unsteadiness on feet (R26.81);Other abnormalities of gait and mobility (R26.89)     Time: 1353-1410 PT Time Calculation (min) (ACUTE ONLY): 17 min  Charges:    $Gait Training: 8-22 mins PT General Charges $$ ACUTE PT VISIT: 1 Visit                     Keiran Sias B. Jewel Mortimer PT, DPT Acute Rehabilitation Services Please use secure chat or  Call Office [phone]    Verlie Glisson Fleet 12/03/2023, 2:20 PM

## 2023-12-03 NOTE — Progress Notes (Signed)
 Patient ID: Ricky Bailey, male   DOB: 1955-07-22, 68 y.o.   MRN: [id number]     Advanced Heart Failure Rounding Note  Cardiologist: None  Chief Complaint: Biventricular Heart Failure Subjective:    68 y/o male admitted with RCA STEMI and VF arrest. Unable to open RCA. Subsequently developed recurrent CP and ECG changes. Re-look cath 5/16 with new ruptured plaque in LAD -> DES x 2 to proximal LAD, RCA remained occluded. RHC with evidence of severe RV failure/shock.  Patient has been on dobutamine  2.5, co-ox 75%.  Torsemide was started on Saturday.  Weight stable, I/Os not accurate. Creatinine higher at 1.84.  CVP 4.   No dyspnea or chest pain.   Objective:    Weight Range: 120.2 kg Body mass index is 36.96 kg/m.   Vital Signs:   Temp:  [98.3 F (36.8 C)-99.1 F (37.3 C)] 98.4 F (36.9 C) (05/26 0420) Pulse Rate:  [79-87] 83 (05/26 0420) Resp:  [19-20] 20 (05/26 0420) BP: (109-130)/(77-84) 130/84 (05/26 0420) SpO2:  [94 %-99 %] 97 % (05/26 0420) Last BM Date : 12/02/23  Weight change: Filed Weights   11/25/23 0645 11/26/23 0700 11/28/23 1352  Weight: 118.3 kg 121 kg 120.2 kg   Intake/Output:  Intake/Output Summary (Last 24 hours) at 12/03/2023 0756 Last data filed at 12/03/2023 0711 Gross per 24 hour  Intake 841 ml  Output 800 ml  Net 41 ml   Physical Exam   CVP 4 General: NAD Neck: No JVD, no thyromegaly or thyroid  nodule.  Lungs: Clear to auscultation bilaterally with normal respiratory effort. CV: Nondisplaced PMI.  Heart regular S1/S2, no S3/S4, no murmur.  No peripheral edema.   Abdomen: Soft, nontender, no hepatosplenomegaly, no distention.  Skin: Intact without lesions or rashes.  Neurologic: Alert and oriented x 3.  Psych: Normal affect. Extremities: No clubbing or cyanosis.  HEENT: Normal.   Telemetry   NSR in 80s (personally reviewed)  Labs    CBC No results for input(s): "WBC", "NEUTROABS", "HGB", "HCT", "MCV", "PLT" in the last 72  hours.  Basic Metabolic Panel Recent Labs    40/98/11 0256 12/03/23 0500  NA 134* 134*  K 4.3 4.2  CL 102 104  CO2 22 22  GLUCOSE 124* 128*  BUN 35* 36*  CREATININE 1.68* 1.84*  CALCIUM  8.9 8.9   Imaging   No results found.  Medications:    Scheduled Medications:  allopurinol   150 mg Oral Daily   aspirin  EC  81 mg Oral Daily   Chlorhexidine  Gluconate Cloth  6 each Topical Daily   dapagliflozin propanediol  10 mg Oral Daily   digoxin   0.125 mg Oral Daily   finasteride   5 mg Oral Daily   heparin   5,000 Units Subcutaneous Q8H   losartan  25 mg Oral Daily   polyethylene glycol  17 g Oral Daily   prasugrel   10 mg Oral Daily   rosuvastatin   40 mg Oral Daily   senna-docusate  2 tablet Oral QHS   sodium chloride  flush  10-40 mL Intracatheter Q12H   spironolactone   25 mg Oral Daily   Infusions:  DOBUTamine  2.5 mcg/kg/min (12/01/23 2238)    PRN Medications: acetaminophen , guaiFENesin , hydrocortisone cream, magnesium  hydroxide, menthol-cetylpyridinium, ondansetron  (ZOFRAN ) IV, mouth rinse, oxyCODONE , sodium chloride  flush, traZODone   Patient Profile   Ricky Bailey is a 68 y.o. male with depression, HTN, HLD, obesity, BPH, and OSA. Admitted with STEMI  Assessment/Plan   CAD with acute inferior STEMI and RV infarct - LHC 5/13:  thrombotic occlusion of large caliber RCA s/p balloon angio and aspiration thrombectomy. Minimal disease in LAD and LCX. C/B VF arrest in cath lab. - Repeat LHC 5/14: unsuccessful PCI. 99% RCA stenosis  - Recurrent CP and ECG changes on 5/16 -> cath with 99% LAD stenosis due to new ruptured plaque and occluded RCA -> DES x 2 to proximal LAD, no recurrent attempt on RCA.  RHC with severe RV failure shock. - No further chest pain.  - Continue ASA 81 mg  + Effient  10 mg + crestor  40 mg daily  2. Cardiogenic shock due to STEMI and RV infarct - echo 11/22/23 EF 60-65 - echo 5/16 EF 35-40% RV moderately down - Repeat Limited Echo 5/20, EF 30-35%, RV  mod reduced systolic function.   - Co-ox 75% this morning on dobutamine  2.5, I will decrease dobutamine  to 1 today and hopefully can stop tomorrow.    - Torsemide started Saturday but creatinine now up to 1.84 and CVP only 4.  Stop torsemide.  - Continue Farxiga 10 mg daily  - Continue digoxin  0.125 mcg daily - Continue losartan 25 mg daily. If creatinine does not improve with holding torsemide, may need to stop this for the time being.  Will reassess tomorrow.   - Continue spironolactone   25 mg daily  - LifeVest has been delivered  3. VF arrest, PVCs/NSVT - in the setting of acute STEMI and failed revascularization attempts - Amiodarone  stopped 5/17 - no further arrhythmias - No beta blocker due to cardiogenic shock.  - LifeVest as above.  4. Ascending Aortic Aneurysm - measuring 4.2 mm - stable  5. AKI  - sCr 1.53>1.74>1.84 with continued diuresis. Stop torsemide for now with CVP only 4.   Mobilize today, begin to wean dobutamine .  Hopefully home soon.   Length of Stay: 22  Peder Bourdon, MD  12/03/2023, 7:56 AM  Advanced Heart Failure Team Pager [phone] (M-F; 7a - 5p)  Please contact [facility] for night-coverage after hours (5p -7a ) and weekends on [email or web address]

## 2023-12-03 NOTE — Progress Notes (Signed)
 Occupational Therapy Treatment Patient Details Name: Ricky Bailey MRN: [id number] DOB: 09-Mar-1956 Today's Date: 12/03/2023   History of present illness 68 y.o. male presents to [facility] 11/20/23 due to chest pain, code STEMI. Emergent PCI which was unsuccessful 2/2 occluded large RCA. 5/15 progressive R HF, ECG changes, and worsening hypotension w/ urgent R/L heart cath. Pt with new ruptured plaque in LAD, s/p stent. PMHx: HTN, obesity   OT comments  Patient demonstrating good gains with OT treatment. Patient able to get to EOB and ambulate to sink with supervision. Patient stood at sink without seated rest break to perform grooming and UB bathing standing sink. Patient asked to perform functional mobility in hallway before ending session seated in recliner. Discharge recommendations continue to be appropriate. Acute OT to continue to follow to address established goals.       If plan is discharge home, recommend the following:  Assistance with cooking/housework;Assist for transportation;Help with stairs or ramp for entrance   Equipment Recommendations  Tub/shower seat    Recommendations for Other Services      Precautions / Restrictions Precautions Precautions: Fall Recall of Precautions/Restrictions: Intact Precaution/Restrictions Comments: watch O2 Restrictions Weight Bearing Restrictions Per Provider Order: No       Mobility Bed Mobility Overal bed mobility: Needs Assistance Bed Mobility: Rolling, Sidelying to Sit Rolling: Supervision, Used rails Sidelying to sit: Supervision, Used rails       General bed mobility comments: verbal cues to use rail    Transfers Overall transfer level: Needs assistance Equipment used: Rolling walker (2 wheels) Transfers: Sit to/from Stand Sit to Stand: Supervision           General transfer comment: supervision for safety     Balance Overall balance assessment: Needs assistance Sitting-balance support: Feet supported Sitting  balance-Leahy Scale: Good     Standing balance support: No upper extremity supported Standing balance-Leahy Scale: Fair Standing balance comment: able to stand at sink with no UE support                           ADL either performed or assessed with clinical judgement   ADL Overall ADL's : Needs assistance/impaired     Grooming: Wash/dry face;Wash/dry hands;Oral care;Applying deodorant;Supervision/safety;Standing Grooming Details (indicate cue type and reason): at sink Upper Body Bathing: Supervision/ safety;Standing       Upper Body Dressing : Minimal assistance;Standing Upper Body Dressing Details (indicate cue type and reason): due to lines                 Functional mobility during ADLs: Rolling walker (2 wheels);Supervision/safety      Extremity/Trunk Assessment              Occupational psychologist Communication: No apparent difficulties   Cognition Arousal: Alert Behavior During Therapy: WFL for tasks assessed/performed Cognition: No apparent impairments                               Following commands: Intact        Cueing   Cueing Techniques: Verbal cues  Exercises      Shoulder Instructions       General Comments VSS on RA    Pertinent Vitals/ Pain       Pain Assessment Pain Assessment: No/denies pain Pain Intervention(s): Monitored during session  Home Living                                          Prior Functioning/Environment              Frequency  Min 2X/week        Progress Toward Goals  OT Goals(current goals can now be found in the care plan section)  Progress towards OT goals: Progressing toward goals  Acute Rehab OT Goals Patient Stated Goal: to go home OT Goal Formulation: With patient Time For Goal Achievement: 12/10/23 Potential to Achieve Goals: Good ADL Goals Pt Will Perform Grooming: with modified  independence;standing Pt Will Perform Lower Body Dressing: with modified independence;sit to/from stand Pt Will Transfer to Toilet: with modified independence;regular height toilet;ambulating  Plan      Co-evaluation                 AM-PAC OT "6 Clicks" Daily Activity     Outcome Measure   Help from another person eating meals?: None Help from another person taking care of personal grooming?: A Little Help from another person toileting, which includes using toliet, bedpan, or urinal?: A Little Help from another person bathing (including washing, rinsing, drying)?: A Little Help from another person to put on and taking off regular upper body clothing?: None Help from another person to put on and taking off regular lower body clothing?: A Little 6 Click Score: 20    End of Session Equipment Utilized During Treatment: Rolling walker (2 wheels);Gait belt  OT Visit Diagnosis: Unsteadiness on feet (R26.81);Other (comment)   Activity Tolerance Patient tolerated treatment well   Patient Left in chair;with call bell/phone within reach   Nurse Communication Mobility status        Time: 5784-6962 OT Time Calculation (min): 26 min  Charges: OT General Charges $OT Visit: 1 Visit OT Treatments $Self Care/Home Management : 8-22 mins $Therapeutic Activity: 8-22 mins  Anitra Barn, OTA Acute Rehabilitation Services  Office [phone]   Jovita Nipper 12/03/2023, 11:28 AM

## 2023-12-04 DIAGNOSIS — I2119 ST elevation (STEMI) myocardial infarction involving other coronary artery of inferior wall: Secondary | ICD-10-CM | POA: Diagnosis not present

## 2023-12-04 LAB — CBC
HCT: 34.7 % — ABNORMAL LOW (ref 39.0–52.0)
Hemoglobin: 11.2 g/dL — ABNORMAL LOW (ref 13.0–17.0)
MCH: 30.1 pg (ref 26.0–34.0)
MCHC: 32.3 g/dL (ref 30.0–36.0)
MCV: 93.3 fL (ref 80.0–100.0)
Platelets: 411 10*3/uL — ABNORMAL HIGH (ref 150–400)
RBC: 3.72 MIL/uL — ABNORMAL LOW (ref 4.22–5.81)
RDW: 14 % (ref 11.5–15.5)
WBC: 9.2 10*3/uL (ref 4.0–10.5)
nRBC: 0 % (ref 0.0–0.2)

## 2023-12-04 LAB — COMPREHENSIVE METABOLIC PANEL WITH GFR
ALT: 77 U/L — ABNORMAL HIGH (ref 0–44)
AST: 34 U/L (ref 15–41)
Albumin: 2.8 g/dL — ABNORMAL LOW (ref 3.5–5.0)
Alkaline Phosphatase: 91 U/L (ref 38–126)
Anion gap: 8 (ref 5–15)
BUN: 31 mg/dL — ABNORMAL HIGH (ref 8–23)
CO2: 22 mmol/L (ref 22–32)
Calcium: 9.1 mg/dL (ref 8.9–10.3)
Chloride: 105 mmol/L (ref 98–111)
Creatinine, Ser: 1.59 mg/dL — ABNORMAL HIGH (ref 0.61–1.24)
GFR, Estimated: 47 mL/min — ABNORMAL LOW (ref >60)
Glucose, Bld: 135 mg/dL — ABNORMAL HIGH (ref 70–99)
Potassium: 4.4 mmol/L (ref 3.5–5.1)
Sodium: 135 mmol/L (ref 135–145)
Total Bilirubin: 1.2 mg/dL (ref 0.0–1.2)
Total Protein: 7.6 g/dL (ref 6.5–8.1)

## 2023-12-04 LAB — COOXEMETRY PANEL
Carboxyhemoglobin: 0.9 % (ref 0.5–1.5)
Methemoglobin: 0.8 % (ref 0.0–1.5)
O2 Saturation: 45.1 %
Total hemoglobin: 11.8 g/dL — ABNORMAL LOW (ref 12.0–16.0)

## 2023-12-04 NOTE — Progress Notes (Signed)
 Patient ID: Ricky Bailey, male   DOB: 09/03/1955, 68 y.o.   MRN: [id number]     Advanced Heart Failure Rounding Note  Cardiologist: None   Chief Complaint: Biventricular Heart Failure  Subjective:    DBA stopped 05/19, restarted at 2.5 on 05/22 given low-output symptoms.  DBA weaned to 1 mcg/kg/min yesterday.   No AM labs.  Feeling a little better each day. Dyspnea improving. Walked the halls twice yesterday.   Objective:    Weight Range: 120.2 kg Body mass index is 36.96 kg/m.   Vital Signs:   Temp:  [97.5 F (36.4 C)-98.3 F (36.8 C)] 98.3 F (36.8 C) (05/27 0313) Pulse Rate:  [76-89] 82 (05/27 0313) Resp:  [17-20] 19 (05/27 0313) BP: (101-113)/(70-80) 104/76 (05/27 0313) SpO2:  [94 %-100 %] 94 % (05/27 0313) Last BM Date : 12/02/23  Weight change: Filed Weights   11/25/23 0645 11/26/23 0700 11/28/23 1352  Weight: 118.3 kg 121 kg 120.2 kg   Intake/Output:  Intake/Output Summary (Last 24 hours) at 12/04/2023 0756 Last data filed at 12/04/2023 0518 Gross per 24 hour  Intake 1300 ml  Output 1300 ml  Net 0 ml   Physical Exam   General:  Well appearing.  Neck: JVP to midneck Cor: Regular rate & rhythm. No rubs, gallops or murmurs. Lungs: clear Abdomen: soft, nontender, nondistended.  Extremities: no edema Neuro: alert & orientedx3. Affect pleasant   Telemetry   SR 70s-80s (personally reviewed)  Labs    CBC No results for input(s): "WBC", "NEUTROABS", "HGB", "HCT", "MCV", "PLT" in the last 72 hours.  Basic Metabolic Panel Recent Labs    96/29/52 0256 12/03/23 0500  NA 134* 134*  K 4.3 4.2  CL 102 104  CO2 22 22  GLUCOSE 124* 128*  BUN 35* 36*  CREATININE 1.68* 1.84*  CALCIUM  8.9 8.9   Imaging   No results found.  Medications:    Scheduled Medications:  allopurinol   150 mg Oral Daily   aspirin  EC  81 mg Oral Daily   Chlorhexidine  Gluconate Cloth  6 each Topical Daily   dapagliflozin propanediol  10 mg Oral Daily   digoxin   0.125  mg Oral Daily   finasteride   5 mg Oral Daily   heparin   5,000 Units Subcutaneous Q8H   losartan  25 mg Oral Daily   polyethylene glycol  17 g Oral Daily   prasugrel   10 mg Oral Daily   rosuvastatin   40 mg Oral Daily   senna-docusate  2 tablet Oral QHS   sodium chloride  flush  10-40 mL Intracatheter Q12H   spironolactone   25 mg Oral Daily   Infusions:    PRN Medications: acetaminophen , guaiFENesin , hydrocortisone cream, magnesium  hydroxide, menthol-cetylpyridinium, ondansetron  (ZOFRAN ) IV, mouth rinse, oxyCODONE , sodium chloride  flush, traZODone   Patient Profile   68 y/o male admitted with RCA STEMI and VF arrest. Unable to open RCA. Subsequently developed recurrent CP and ECG changes. Re-look cath 5/16 with new ruptured plaque in LAD -> DES x 2 to proximal LAD, RCA remained occluded. RHC with evidence of severe RV failure/shock.  Assessment/Plan   CAD with acute inferior STEMI and RV infarct - LHC 5/13: thrombotic occlusion of large caliber RCA s/p balloon angio and aspiration thrombectomy. Minimal disease in LAD and LCX. C/B VF arrest in cath lab. - Repeat LHC 5/14: unsuccessful PCI. 99% RCA stenosis  - Recurrent CP and ECG changes on 5/16 -> cath with 99% LAD stenosis due to new ruptured plaque and occluded RCA -> DES  x 2 to proximal LAD, no recurrent attempt on RCA.  RHC with severe RV failure shock. - No further chest pain.  - Continue ASA 81 mg  + Effient  10 mg + crestor  40 mg daily  2. Cardiogenic shock due to STEMI and RV infarct - echo 11/22/23 EF 60-65 - echo 5/16 EF 35-40% RV moderately down - Repeat Limited Echo 5/20, EF 30-35%, RV mod reduced systolic function.   - DBA off 05/19. Failed wean and restarted on 05/22. DBA down to 1 this am, will stop. Check CO-OX.  - Unable to obtain CVP. JVP to midneck. Cr up yesterday. Hold on diuretics, needs higher RA pressure with RV dysfunction.  - Continue Farxiga 10 mg daily  - Continue digoxin  0.125 mcg daily - Continue  losartan 25 mg daily. If creatinine is up further today, will hold. - Continue spironolactone   25 mg daily  - LifeVest has been delivered  3. VF arrest, PVCs/NSVT - in the setting of acute STEMI and failed revascularization attempts - Amiodarone  stopped 5/17 - no further arrhythmias - No beta blocker due to cardiogenic shock.  - LifeVest as above.  4. Ascending Aortic Aneurysm - measuring 4.2 mm - stable  5. AKI  - sCr 1.53>1.74>1.84>> awaiting today's labs. Diuretic now on hold.    Continue to mobilize. Will keep at least until tomorrow to see how he does with weaning Dobutamine .  Length of Stay: 14  Dillon Livermore N, PA-C  12/04/2023, 7:56 AM  Advanced Heart Failure Team Pager [phone] (M-F; 7a - 5p)  Please contact [facility] for night-coverage after hours (5p -7a ) and weekends on [email or web address]

## 2023-12-04 NOTE — Plan of Care (Signed)
   Problem: Education: Goal: Knowledge of General Education information will improve Description Including pain rating scale, medication(s)/side effects and non-pharmacologic comfort measures Outcome: Progressing

## 2023-12-04 NOTE — Progress Notes (Signed)
 CARDIAC REHAB PHASE I   PRE:  Rate/Rhythm: 77 SR   BP:  Sitting: 108/76      SaO2: 98 RA   MODE:  Ambulation: 470 ft   POST:  Rate/Rhythm: 101 ST   BP:  Sitting: 107/79      SaO2: 96 RA   Pt ambulated in hallway with assist. Tolerated well with no CP, dizziness and mild SOB off and on. Returned to bed with call bell and bedside table in reach. Will continue to follow.     [id number] Ronny Colas, RN BSN 12/04/2023 10:13 AM

## 2023-12-04 NOTE — Progress Notes (Signed)
 Mobility Specialist Progress Note:    12/04/23 1532  Mobility  Activity Ambulated with assistance in hallway  Level of Assistance Standby assist, set-up cues, supervision of patient - no hands on  Assistive Device Front wheel walker  Distance Ambulated (ft) 500 ft  RUE Weight Bearing Per Provider Order PWB  RLE Weight Bearing Per Provider Order [facility]  Activity Response Tolerated well  Mobility Referral Yes  Mobility visit 1 Mobility  Mobility Specialist Start Time (ACUTE ONLY) 1415  Mobility Specialist Stop Time (ACUTE ONLY) 1430  Mobility Specialist Time Calculation (min) (ACUTE ONLY) 15 min   Received pt getting off [facility] w/ nursing staff having no complaints and agreeable to mobility. Pt was asymptomatic throughout ambulation and returned to room w/o fault. Left in bed w/ call bell in reach and all needs met.   Inetta Manes Mobility Specialist  Please contact vis Secure Chat or  Rehab Office [phone]

## 2023-12-04 NOTE — TOC Progression Note (Signed)
 Transition of Care [facility]) - Progression Note    Patient Details  Name: Ricky Bailey MRN: [id number] Date of Birth: November 02, 1955  Transition [facility]) CM/SW Contact  Benjiman Bras, RN Phone Number: [phone] 12/04/2023, 12:50 PM  Clinical Narrative:     Spoke to pt and he has received DME, Life Vest, Rollator and bedside commode. Pt gave permission to speak to dtr.  Home Health arranged with [facility] [address] [facility].   Hospital follow up schedueled for December 13, 2023 at 11 am with Burdette Carolin PA.   Expected Discharge Plan: Home w Home Health Services Barriers to Discharge: No Barriers Identified  Expected Discharge Plan and Services   Discharge Planning Services: CM Consult Post Acute Care Choice: Home Health Living arrangements for the past 2 months: Apartment                 DME Arranged: 3-N-1, Walker rolling with seat DME Agency: [employer] Date DME Agency Contacted: 11/27/23 Time DME Agency Contacted: [phone] Representative spoke with at DME Agency: Myrtie Atkinson HH Arranged: PT, OT, Nurse's Aide HH Agency: [facility] Date [facility] Agency Contacted: 11/28/23 Time HH Agency Contacted: 1215 Representative spoke with at [facility] Agency: Randel Buss   Social Determinants of Health (SDOH) Interventions SDOH Screenings   Food Insecurity: No Food Insecurity (11/20/2023)  Housing: Low Risk  (11/20/2023)  Transportation Needs: No Transportation Needs (11/20/2023)  Utilities: Not At Risk (11/20/2023)  Alcohol Screen: Medium Risk (02/09/2023)  Social Connections: Moderately Isolated (11/20/2023)  Tobacco Use: Medium Risk (11/20/2023)    Readmission Risk Interventions     No data to display

## 2023-12-05 ENCOUNTER — Other Ambulatory Visit (HOSPITAL_COMMUNITY): Payer: Self-pay

## 2023-12-05 LAB — BASIC METABOLIC PANEL WITH GFR
Anion gap: 8 (ref 5–15)
BUN: 30 mg/dL — ABNORMAL HIGH (ref 8–23)
CO2: 22 mmol/L (ref 22–32)
Calcium: 9 mg/dL (ref 8.9–10.3)
Chloride: 105 mmol/L (ref 98–111)
Creatinine, Ser: 1.49 mg/dL — ABNORMAL HIGH (ref 0.61–1.24)
GFR, Estimated: 51 mL/min — ABNORMAL LOW (ref >60)
Glucose, Bld: 149 mg/dL — ABNORMAL HIGH (ref 70–99)
Potassium: 4.4 mmol/L (ref 3.5–5.1)
Sodium: 135 mmol/L (ref 135–145)

## 2023-12-05 LAB — COOXEMETRY PANEL
Carboxyhemoglobin: 2.3 % — ABNORMAL HIGH (ref 0.5–1.5)
Carboxyhemoglobin: 1.5 % (ref 0.5–1.5)
Methemoglobin: <0.7 % (ref 0.0–1.5)
Methemoglobin: <0.7 % (ref 0.0–1.5)
O2 Saturation: 97.5 %
O2 Saturation: 57.6 %
Total hemoglobin: 12.1 g/dL (ref 12.0–16.0)
Total hemoglobin: 12.4 g/dL (ref 12.0–16.0)

## 2023-12-05 MED ORDER — LOSARTAN POTASSIUM 25 MG PO TABS
25.0000 mg | ORAL_TABLET | Freq: Every day | ORAL | 3 refills | Status: DC
Start: 2023-12-06 — End: 2023-12-12
  Filled 2023-12-05: qty 30, 30d supply, fill #0

## 2023-12-05 MED ORDER — ASPIRIN 81 MG PO TBEC
81.0000 mg | DELAYED_RELEASE_TABLET | Freq: Every day | ORAL | 12 refills | Status: AC
  Filled 2023-12-05 – 2023-12-25 (×2): qty 30, 30d supply, fill #0
  Filled 2024-02-05: qty 30, 1d supply, fill #1
  Filled 2024-02-07: qty 30, 30d supply, fill #2
  Filled 2024-03-05: qty 30, 30d supply, fill #3
  Filled 2024-04-04: qty 30, 30d supply, fill #4
  Filled 2024-05-05: qty 30, 30d supply, fill #5
  Filled 2024-06-03: qty 30, 30d supply, fill #6
  Filled 2024-07-03 – 2024-07-07 (×2): qty 30, 30d supply, fill #7
  Filled 2024-08-04: qty 30, 30d supply, fill #8

## 2023-12-05 MED ORDER — ROSUVASTATIN CALCIUM 40 MG PO TABS
40.0000 mg | ORAL_TABLET | Freq: Every day | ORAL | 3 refills | Status: DC
Start: 2023-12-06 — End: 2024-03-31
  Filled 2023-12-05 – 2024-01-03 (×3): qty 30, 30d supply, fill #0
  Filled 2024-02-05: qty 30, 30d supply, fill #1
  Filled 2024-02-29: qty 30, 30d supply, fill #2

## 2023-12-05 MED ORDER — DAPAGLIFLOZIN PROPANEDIOL 10 MG PO TABS
10.0000 mg | ORAL_TABLET | Freq: Every day | ORAL | 11 refills | Status: AC
  Filled 2023-12-05 – 2023-12-25 (×2): qty 30, 30d supply, fill #0
  Filled 2024-02-05 – 2024-02-06 (×2): qty 30, 30d supply, fill #1
  Filled 2024-02-29: qty 30, 30d supply, fill #2
  Filled 2024-03-31: qty 30, 30d supply, fill #3
  Filled 2024-04-29: qty 30, 30d supply, fill #4
  Filled 2024-05-29: qty 30, 30d supply, fill #5
  Filled 2024-06-30: qty 30, 30d supply, fill #6
  Filled 2024-07-28: qty 30, 30d supply, fill #7

## 2023-12-05 MED ORDER — TORSEMIDE 20 MG PO TABS
20.0000 mg | ORAL_TABLET | Freq: Every day | ORAL | 2 refills | Status: DC | PRN
  Filled 2023-12-05: qty 30, 30d supply, fill #0

## 2023-12-05 MED ORDER — PRASUGREL HCL 10 MG PO TABS
10.0000 mg | ORAL_TABLET | Freq: Every day | ORAL | 3 refills | Status: DC
  Filled 2023-12-05 – 2024-01-03 (×3): qty 30, 30d supply, fill #0
  Filled 2024-02-05: qty 30, 30d supply, fill #1
  Filled 2024-02-29: qty 30, 30d supply, fill #2

## 2023-12-05 MED ORDER — TORSEMIDE 20 MG PO TABS
ORAL_TABLET | ORAL | 3 refills | Status: DC
  Filled 2023-12-05 – 2024-01-03 (×3): qty 30, 30d supply, fill #0
  Filled 2024-02-05: qty 30, 30d supply, fill #1
  Filled 2024-02-29: qty 30, 30d supply, fill #2

## 2023-12-05 MED ORDER — SPIRONOLACTONE 25 MG PO TABS
25.0000 mg | ORAL_TABLET | Freq: Every day | ORAL | 3 refills | Status: DC
Start: 2023-12-06 — End: 2024-03-05
  Filled 2023-12-05 – 2024-01-03 (×3): qty 30, 30d supply, fill #0
  Filled 2024-02-05: qty 30, 1d supply, fill #1
  Filled 2024-02-07: qty 30, 30d supply, fill #2

## 2023-12-05 MED ORDER — POTASSIUM CHLORIDE CRYS ER 20 MEQ PO TBCR
EXTENDED_RELEASE_TABLET | ORAL | 3 refills | Status: DC
  Filled 2023-12-05 – 2024-01-03 (×3): qty 30, 30d supply, fill #0
  Filled 2024-02-05: qty 30, 1d supply, fill #1
  Filled 2024-02-07: qty 30, 30d supply, fill #2

## 2023-12-05 MED ORDER — DIGOXIN 125 MCG PO TABS
0.1250 mg | ORAL_TABLET | Freq: Every day | ORAL | 3 refills | Status: DC
  Filled 2023-12-05 – 2024-01-07 (×3): qty 30, 30d supply, fill #0
  Filled 2024-02-06: qty 30, 30d supply, fill #1

## 2023-12-05 NOTE — Progress Notes (Signed)
 1330 daughter at bedside watching life vest videos with patient they both ask if they can now go they are comfortable with life vest.

## 2023-12-05 NOTE — Plan of Care (Signed)
  Problem: Education: Goal: Knowledge of General Education information will improve Description: Including pain rating scale, medication(s)/side effects and non-pharmacologic comfort measures Outcome: Not Progressing   Problem: Health Behavior/Discharge Planning: Goal: Ability to manage health-related needs will improve Outcome: Not Progressing   Problem: Clinical Measurements: Goal: Ability to maintain clinical measurements within normal limits will improve Outcome: Progressing Goal: Will remain free from infection Outcome: Progressing Goal: Diagnostic test results will improve Outcome: Progressing Goal: Respiratory complications will improve Outcome: Progressing Goal: Cardiovascular complication will be avoided Outcome: Not Progressing   Problem: Activity: Goal: Risk for activity intolerance will decrease Outcome: Progressing   Problem: Nutrition: Goal: Adequate nutrition will be maintained Outcome: Progressing   Problem: Coping: Goal: Level of anxiety will decrease Outcome: Progressing   Problem: Safety: Goal: Ability to remain free from injury will improve Outcome: Progressing   Problem: Skin Integrity: Goal: Risk for impaired skin integrity will decrease Outcome: Progressing   Problem: Education: Goal: Understanding of CV disease, CV risk reduction, and recovery process will improve Outcome: Progressing Goal: Individualized Educational Video(s) Outcome: Progressing   Problem: Activity: Goal: Ability to return to baseline activity level will improve Outcome: Progressing   Problem: Cardiovascular: Goal: Ability to achieve and maintain adequate cardiovascular perfusion will improve Outcome: Progressing Goal: Vascular access site(s) Level 0-1 will be maintained Outcome: Progressing   Problem: Health Behavior/Discharge Planning: Goal: Ability to safely manage health-related needs after discharge will improve Outcome: Progressing Patient forgetful unable to  verbalize or physically show how to use life vest patient will need help with managing care at home and therapy needs

## 2023-12-05 NOTE — Progress Notes (Signed)
 0715 patient alert x4 on room air

## 2023-12-05 NOTE — Progress Notes (Signed)
 PICC line removed per order. Discussed with patient post-removal instructions, including laying flat for minimum of . Patient verbalizes understanding. No complications noted, dressing remainds clean/dry/intact at this time.

## 2023-12-05 NOTE — Care Management Important Message (Signed)
 Important Message  Patient Details  Name: Ricky Bailey MRN: [id number] Date of Birth: 05-06-1956   Important Message Given:  Yes - Medicare IM     Janith Melnick 12/05/2023, 9:58 AM

## 2023-12-05 NOTE — Plan of Care (Signed)
  Problem: Education: Goal: Knowledge of General Education information will improve Description: Including pain rating scale, medication(s)/side effects and non-pharmacologic comfort measures 12/05/2023 1331 by Paulla Bossier, RN Outcome: Adequate for Discharge 12/05/2023 1120 by Paulla Bossier, RN Outcome: Not Progressing   Problem: Health Behavior/Discharge Planning: Goal: Ability to manage health-related needs will improve 12/05/2023 1331 by Paulla Bossier, RN Outcome: Adequate for Discharge 12/05/2023 1120 by Paulla Bossier, RN Outcome: Not Progressing   Problem: Clinical Measurements: Goal: Ability to maintain clinical measurements within normal limits will improve 12/05/2023 1331 by Paulla Bossier, RN Outcome: Adequate for Discharge 12/05/2023 1120 by Paulla Bossier, RN Outcome: Progressing Goal: Will remain free from infection 12/05/2023 1331 by Paulla Bossier, RN Outcome: Adequate for Discharge 12/05/2023 1120 by Paulla Bossier, RN Outcome: Progressing Goal: Diagnostic test results will improve 12/05/2023 1331 by Paulla Bossier, RN Outcome: Adequate for Discharge 12/05/2023 1120 by Paulla Bossier, RN Outcome: Progressing Goal: Respiratory complications will improve 12/05/2023 1331 by Paulla Bossier, RN Outcome: Adequate for Discharge 12/05/2023 1120 by Paulla Bossier, RN Outcome: Progressing Goal: Cardiovascular complication will be avoided 12/05/2023 1331 by Paulla Bossier, RN Outcome: Adequate for Discharge 12/05/2023 1120 by Paulla Bossier, RN Outcome: Not Progressing   Problem: Activity: Goal: Risk for activity intolerance will decrease 12/05/2023 1331 by Paulla Bossier, RN Outcome: Adequate for Discharge 12/05/2023 1120 by Paulla Bossier, RN Outcome: Progressing   Problem: Nutrition: Goal: Adequate nutrition will be maintained 12/05/2023 1331 by Paulla Bossier, RN Outcome: Adequate for Discharge 12/05/2023 1120 by Paulla Bossier, RN Outcome:  Progressing   Problem: Coping: Goal: Level of anxiety will decrease 12/05/2023 1331 by Paulla Bossier, RN Outcome: Adequate for Discharge 12/05/2023 1120 by Paulla Bossier, RN Outcome: Progressing   Problem: Skin Integrity: Goal: Risk for impaired skin integrity will decrease 12/05/2023 1331 by Paulla Bossier, RN Outcome: Adequate for Discharge 12/05/2023 1120 by Paulla Bossier, RN Outcome: Progressing   Problem: Education: Goal: Understanding of CV disease, CV risk reduction, and recovery process will improve 12/05/2023 1331 by Paulla Bossier, RN Outcome: Adequate for Discharge 12/05/2023 1120 by Paulla Bossier, RN Outcome: Progressing Goal: Individualized Educational Video(s) 12/05/2023 1331 by Paulla Bossier, RN Outcome: Adequate for Discharge 12/05/2023 1120 by Paulla Bossier, RN Outcome: Progressing   Problem: Activity: Goal: Ability to return to baseline activity level will improve 12/05/2023 1331 by Paulla Bossier, RN Outcome: Adequate for Discharge 12/05/2023 1120 by Paulla Bossier, RN Outcome: Progressing   Problem: Cardiovascular: Goal: Ability to achieve and maintain adequate cardiovascular perfusion will improve 12/05/2023 1331 by Paulla Bossier, RN Outcome: Adequate for Discharge 12/05/2023 1120 by Paulla Bossier, RN Outcome: Progressing Goal: Vascular access site(s) Level 0-1 will be maintained 12/05/2023 1331 by Paulla Bossier, RN Outcome: Adequate for Discharge 12/05/2023 1120 by Paulla Bossier, RN Outcome: Progressing   Problem: Health Behavior/Discharge Planning: Goal: Ability to safely manage health-related needs after discharge will improve 12/05/2023 1331 by Paulla Bossier, RN Outcome: Adequate for Discharge 12/05/2023 1120 by Paulla Bossier, RN Outcome: Progressing

## 2023-12-05 NOTE — Progress Notes (Signed)
 Mobility Specialist Progress Note;   12/05/23 1115  Mobility  Activity Ambulated with assistance in hallway  Level of Assistance Standby assist, set-up cues, supervision of patient - no hands on  Assistive Device Front wheel walker  Distance Ambulated (ft) 500 ft  Activity Response Tolerated well  Mobility Referral Yes  Mobility visit 1 Mobility  Mobility Specialist Start Time (ACUTE ONLY) 1115  Mobility Specialist Stop Time (ACUTE ONLY) 1125  Mobility Specialist Time Calculation (min) (ACUTE ONLY) 10 min   Pt agreeable to mobility. Required no physical assistance during ambulation, SV. Ambulated on RA, VSS throughout. No c/o when asked. Pt returned back to chair with all needs met, call bell in reach.   Janit Meline Mobility Specialist Please contact via SecureChat or [employer] [phone]

## 2023-12-05 NOTE — Progress Notes (Signed)
 Physical Therapy Treatment Patient Details Name: Ricky Bailey MRN: [id number] DOB: 1955-08-10 Today's Date: 12/05/2023   History of Present Illness 68 y.o. male [facility]20[facility]23 due to chest pain, code STEMI. Emergent PCI which was unsuccessful 2/2 occluded large RCA. 5/15 progressive R HF, ECG changes, and worsening hypotension w/ urgent R/L heart cath. Pt with new ruptured plaque in LAD, s/p stent. PMHx: HTN, obesity   PT Comments  Pt in bed upon arrival and agreeable to PT session. Worked on [employer] fast streamer with rollator in today's session. Pt needed repetitive cues to lock/unlock brakes and for proximity to rollator. Pt declined stair negotiation in today's session stating he will have help from family with only two steps in enter. Discussed safe guarding from family with stair negotiation with pt verbalizing understanding. Continue to recommend HHPT upon d/c home. Acute PT to follow.     If plan is discharge home, recommend the following: A little help with walking and/or transfers;A little help with bathing/dressing/bathroom;Assistance with cooking/housework;Assist for transportation;Help with stairs or ramp for entrance   Can travel by private vehicle      Yes  Equipment Recommendations  None recommended by PT       Precautions / Restrictions Precautions Precautions: Fall Recall of Precautions/Restrictions: Intact Precaution/Restrictions Comments: watch O2 Restrictions Weight Bearing Restrictions Per Provider Order: No     Mobility  Bed Mobility Overal bed mobility: Modified Independent Bed Mobility: Supine to Sit   Transfers Overall transfer level: Needs assistance Equipment used: Rollator (4 wheels) Transfers: Sit to/from Stand Sit to Stand: Supervision    General transfer comment: supervision for safety, cues to lock breaks prior to standing    Ambulation/Gait Ambulation/Gait assistance: Supervision Gait Distance (Feet): 400 Feet Assistive device:  Rollator (4 wheels) Gait Pattern/deviations: Step-through pattern, Decreased stride length, Trunk flexed Gait velocity: decr     General Gait Details: cues for management of rollator, no overt LOB        Balance Overall balance assessment: Needs assistance Sitting-balance support: Feet supported Sitting balance-Leahy Scale: Good     Standing balance support: No upper extremity supported Standing balance-Leahy Scale: Fair Standing balance comment: able to stand statically with no UE support, rollator for gait       Communication Communication Communication: No apparent difficulties  Cognition Arousal: Alert Behavior During Therapy: WFL for tasks assessed/performed   PT - Cognitive impairments: No apparent impairments    Following commands: Intact      Cueing Cueing Techniques: Verbal cues     General Comments General comments (skin integrity, edema, etc.): VSS on RA      Pertinent Vitals/Pain Pain Assessment Pain Assessment: No/denies pain     PT Goals (current goals can now be found in the care plan section) Acute Rehab PT Goals PT Goal Formulation: With patient/family Time For Goal Achievement: 12/09/23 Potential to Achieve Goals: Good Progress towards PT goals: Progressing toward goals    Frequency    Min 2X/week       AM-PAC PT "6 Clicks" Mobility   Outcome Measure  Help needed turning from your back to your side while in a flat bed without using bedrails?: None Help needed moving from lying on your back to sitting on the side of a flat bed without using bedrails?: None Help needed moving to and from a bed to a chair (including a wheelchair)?: A Little Help needed standing up from a chair using your arms (e.g., wheelchair or bedside chair)?: A Little Help needed to  walk in hospital room?: A Little Help needed climbing 3-5 steps with a railing? : A Little 6 Click Score: 20    End of Session Equipment Utilized During Treatment: Gait  belt Activity Tolerance: Patient tolerated treatment well Patient left: in chair;with call bell/phone within reach Nurse Communication: Mobility status PT Visit Diagnosis: Unsteadiness on feet (R26.81);Other abnormalities of gait and mobility (R26.89)     Time: 5284-1324 PT Time Calculation (min) (ACUTE ONLY): 15 min  Charges:    $Gait Training: 8-22 mins PT General Charges $$ ACUTE PT VISIT: 1 Visit                    Orysia Blas, PT, DPT Secure Chat Preferred  Rehab Office [phone]   Alissa April Adela Ades 12/05/2023, 9:52 AM

## 2023-12-06 ENCOUNTER — Encounter (HOSPITAL_COMMUNITY)

## 2023-12-10 NOTE — Progress Notes (Signed)
 ADVANCED HF CLINIC CONSULT NOTE  Referring Physician: Lonni Robert, MD Primary Care: Lonni Robert, MD Primary Cardiologist: None HF Cardiologist: Jules Oar, MD  Chief Complaint:  HPI: Ricky Bailey is a 68 y.o. male with CAD, hx VF arrest, AAA, depression, HTN, HLD, obesity, BPH and OSA.   Presented 5/13 w/ acute inferior STEMI. He had VF arrest in the cath lab treated w/ Defib x 1, started on amio gtt. Emergent cath showed thrombotic occlusion of a very large d RCA, treated w/ balloon angioplasty and aspiration thrombectomy. Due to large vessel size and thrombus burden, no stent was placed. Overnight, he developed worsening CP and ST elevations on EKG. Urgent re-look angiography 05/14 showed prox occlusion of the RCA. Revascularization attempted but extremely challenging. Despite attempts, had 99% residual stenosis throughout the vessel w/ no flow in the distal bed.  Procedure aborted given completed infarct. Initial echo 5/15 showed normal LVEF 60-65%, RV mildly reduced. On 5/16, pt developed hypotension and endorsed continuous 5/10 CP w/ new anteroseptal Q waves. Repeat limited echo showed drop in EF down to 35-40%, RV moderately reduced and HK. Went for coronary angiography which showed persistent total occlusion of the prox RCA w/ TIMI 0 flow and ectatic LAD w/ evidence of new plaque rupture with 99% mid vessel stenosis and 60% segmental stenosis just proximal to the culprit. Both lesions were treated successfully w/ DES. RHC c/w RV dominant shock (RA 14, PA 28/6, mPCW 11, CO 4 L/min, CI 1.68 L/min/m, PAPi 1.5). He was started on DBA and Swan was left in to monitor hemodynamics. Repeat echo 30-35%, RV moderately reduced. Dobutamine  weaned off on 05/19 but restarted several days later d/t low-output symptoms. After several days, he was successfully weaned from inotrope support and tolerated GDMT. Lifevest ordered at d/c.   Today he returns for post hospital follow up  with his daughter. Overall feeling good, limited by leg pain. Denies palpitations, CP, dizziness, edema, or PND/Orthopnea. SOB only with exertion. Appetite ok, still using some salt at home but he has been making better food choiced. No fever or chills. Weight at home 244 pounds. Taking all medications. Denies ETOH, tobacco or drug use. Pain in bilateral knees, feet, and toes that started after working with PT. Drinks ~70oz fluid/day. Reports wearing Lifevest daily but they have not connected the hotspot which is why [facility] hasn't gotten any information yet. Has PCP f/u tomorrow.    Past Medical History:  Diagnosis Date   Back pain    BPH (benign prostatic hyperplasia)    Gout    Hyperlipidemia    Hypertension    Obesity    Sleep apnea    Vertigo 03/28/2018   with vommiting    Current Outpatient Medications  Medication Sig Dispense Refill   allopurinol  (ZYLOPRIM ) 300 MG tablet Take 0.5 tablets (150 mg total) by mouth daily. (Patient taking differently: Take 300 mg by mouth daily.) 30 tablet 0   aspirin  EC 81 MG tablet Take 1 tablet (81 mg total) by mouth daily. Swallow whole. 30 tablet 12   dapagliflozin  propanediol (FARXIGA ) 10 MG TABS tablet Take 1 tablet (10 mg total) by mouth daily. 30 tablet 11   digoxin  (LANOXIN ) 0.125 MG tablet Take 1 tablet (0.125 mg total) by mouth daily. 30 tablet 3   finasteride  (PROSCAR ) 5 MG tablet Take 1 tablet (5 mg total) by mouth daily. 30 tablet 0   potassium chloride  SA (KLOR-CON  M) 20 MEQ tablet Take 1 tablet as needed when  taking Torsemide  30 tablet 3   prasugrel  (EFFIENT ) 10 MG TABS tablet Take 1 tablet (10 mg total) by mouth daily. 30 tablet 3   rosuvastatin  (CRESTOR ) 40 MG tablet Take 1 tablet (40 mg total) by mouth daily. 30 tablet 3   spironolactone  (ALDACTONE ) 25 MG tablet Take 1 tablet (25 mg total) by mouth daily. 30 tablet 3   torsemide  (DEMADEX ) 20 MG tablet Take 1 tablet as needed for leg edema, weight gain, shortness of breath 30 tablet 3    No current facility-administered medications for this encounter.    No Known Allergies    Social History   Socioeconomic History   Marital status: Divorced    Spouse name: Not on file   Number of children: Not on file   Years of education: Not on file   Highest education level: Not on file  Occupational History   Not on file  Tobacco Use   Smoking status: Former    Current packs/day: 0.00    Types: Cigarettes    Quit date: 07/21/1977    Years since quitting: 46.4   Smokeless tobacco: Never  Vaping Use   Vaping status: Never Used  Substance and Sexual Activity   Alcohol use: Yes    Alcohol/week: 6.0 standard drinks of alcohol    Types: 6 Cans of beer per week   Drug use: No   Sexual activity: Not on file  Other Topics Concern   Not on file  Social History Narrative   Not on file   Social Drivers of Health   Financial Resource Strain: Not on file  Food Insecurity: No Food Insecurity (11/20/2023)   Hunger Vital Sign    Worried About Running Out of Food in the Last Year: Never true    Ran Out of Food in the Last Year: Never true  Transportation Needs: No Transportation Needs (11/20/2023)   PRAPARE - Administrator, Civil Service (Medical): No    Lack of Transportation (Non-Medical): No  Physical Activity: Not on file  Stress: Not on file  Social Connections: Moderately Isolated (11/20/2023)   Social Connection and Isolation Panel [NHANES]    Frequency of Communication with Friends and Family: More than three times a week    Frequency of Social Gatherings with Friends and Family: Once a week    Attends Religious Services: 1 to 4 times per year    Active Member of [employer] or Organizations: No    Attends Banker Meetings: Never    Marital Status: Divorced  Catering manager Violence: Not At Risk (11/20/2023)   Humiliation, Afraid, Rape, and Kick questionnaire    Fear of Current or Ex-Partner: No    Emotionally Abused: No    Physically Abused:  No    Sexually Abused: No      Family History  Problem Relation Age of Onset   Heart disease Mother    Lung cancer Father    Colon polyps Neg Hx    Colon cancer Neg Hx    Esophageal cancer Neg Hx    Rectal cancer Neg Hx    Stomach cancer Neg Hx     Vitals:   12/12/23 1011  BP: 116/78  Pulse: 80  SpO2: 98%  Weight: 113.2 kg (249 lb 9.6 oz)  Height: 5\' 11"  (1.803 m)    PHYSICAL EXAM: General:  well appearing.  No respiratory difficulty. Walked into clinic with walker.  Neck: supple. JVD flat.  Cor: PMI nondisplaced. Regular rate &  rhythm. No rubs, gallops or murmurs. Wearing lifevest.  Lungs: clear Extremities: no cyanosis, clubbing, rash, edema  Neuro: alert & oriented x 3. Moves all 4 extremities w/o difficulty. Affect pleasant.   Wt Readings from Last 3 Encounters:  12/12/23 113.2 kg (249 lb 9.6 oz)  11/28/23 120.2 kg (264 lb 15.9 oz)  10/16/22 115.7 kg (255 lb)     ECG: NSR 75 bpm  (Personally reviewed)    ASSESSMENT & PLAN: CAD with acute inferior STEMI and RV infarct - LHC 5/13: thrombotic occlusion of large caliber RCA s/p balloon angio and aspiration thrombectomy. Minimal disease in LAD and LCX. C/B VF arrest in cath lab. - Repeat LHC 5/14: unsuccessful PCI. 99% RCA stenosis  - Recurrent CP and ECG changes on 5/16 -> cath with 99% LAD stenosis due to new ruptured plaque and occluded RCA -> DES x 2 to proximal LAD, no recurrent attempt on RCA.  RHC with severe RV failure shock. - No further chest pain.  - Continue ASA 81 mg  + Effient  10 mg + crestor  40 mg daily   2. Recent cardiogenic shock due to STEMI and RV infarct - echo 11/22/23 EF 60-65 - echo 5/16 EF 35-40% RV moderately down - Repeat Limited Echo 5/20, EF 30-35%, RV mod reduced systolic function.   - DBA off 05/19. Failed wean and restarted on 05/22. Weaned off DBA again on 05/27.  - Volume stable. Hold off on daily diuretic. Continue Torsemide  20 mg PRN and Potassium chloride  20 mEq PRN. He has been  weighing daily and keeping a log, has not taken any since discharge.  - Continue Farxiga  10 mg daily  - Continue digoxin  0.125 mcg daily - Stop losartan , start Entresto 24-26 mg BID. BMET/BNP today. Repeat BMET in 7-10 days. Working on getting a Tax inspector.  - Continue spironolactone   25 mg daily  - No beta blocker d/t recent shock as above  - Compliant with LifeVest, interrogation today with minimal date 2/2 not being hooked up to LIfevest  - repeat echo in ~2 months   3. VF arrest, PVCs/NSVT - in the setting of acute STEMI and failed revascularization attempts - Amiodarone  stopped 5/17 - no further arrhythmias - No beta blocker due to cardiogenic shock.  - LifeVest as above.   4. Ascending Aortic Aneurysm - measuring 4.2 mm - stable  Follow up in 4-6 weeks with PharmD for GDMT optimization. 2 months with Dr. Julane Ny + echo  Sheryl Donna AGACNP-BC  12/12/23  [facility] [address] [facility] [place] [address] [phone] (office) [phone] (fax)

## 2023-12-11 ENCOUNTER — Telehealth (HOSPITAL_COMMUNITY): Payer: Self-pay

## 2023-12-11 NOTE — Telephone Encounter (Signed)
 Please contact PCP. He has follow up in our office tomorrow. If symptoms get worse he should ben evaluated at ED or Urgent Care.   Nieves Bars NP- C 12:26 PM

## 2023-12-11 NOTE — Telephone Encounter (Signed)
 Called to confirm/remind patient of their appointment at the [facility] Heart Failure Clinic on 12/12/2023 10:00.   Appointment:   [] Confirmed  [] Left mess   [x] No answer/No voice mail  [] VM Full/unable to leave message  [] Phone not in service  Patient reminded to bring all medications and/or complete list.  Confirmed patient has transportation. Gave directions, instructed to utilize valet parking.

## 2023-12-11 NOTE — Telephone Encounter (Signed)
Patients daughter advised and verbalized understanding.

## 2023-12-11 NOTE — Telephone Encounter (Signed)
 Patient called to report that he has been experiencing tingling and pain in his feet,hands,knees pain knees,feet,hands has not tried any otc medications; patient did have his PT Evaluation yesterday. please advise  CB#[phone]

## 2023-12-12 ENCOUNTER — Other Ambulatory Visit (HOSPITAL_COMMUNITY): Payer: Self-pay

## 2023-12-12 ENCOUNTER — Encounter (HOSPITAL_COMMUNITY): Payer: Self-pay

## 2023-12-12 ENCOUNTER — Telehealth (HOSPITAL_COMMUNITY): Payer: Self-pay

## 2023-12-12 ENCOUNTER — Ambulatory Visit (HOSPITAL_COMMUNITY): Payer: Self-pay | Admitting: Internal Medicine

## 2023-12-12 ENCOUNTER — Ambulatory Visit (HOSPITAL_COMMUNITY)
Admit: 2023-12-12 | Discharge: 2023-12-12 | Disposition: A | Discharge status: Home / Self Care | Source: Ambulatory Visit | Attending: Internal Medicine | Admitting: Internal Medicine

## 2023-12-12 VITALS — BP 116/78 | HR 80 | Ht 71.0 in | Wt 249.6 lb

## 2023-12-12 DIAGNOSIS — I7121 Aneurysm of the ascending aorta, without rupture: Secondary | ICD-10-CM | POA: Diagnosis not present

## 2023-12-12 DIAGNOSIS — E669 Obesity, unspecified: Secondary | ICD-10-CM | POA: Diagnosis not present

## 2023-12-12 DIAGNOSIS — I493 Ventricular premature depolarization: Secondary | ICD-10-CM | POA: Diagnosis not present

## 2023-12-12 DIAGNOSIS — M79672 Pain in left foot: Secondary | ICD-10-CM | POA: Insufficient documentation

## 2023-12-12 DIAGNOSIS — I2510 Atherosclerotic heart disease of native coronary artery without angina pectoris: Secondary | ICD-10-CM | POA: Insufficient documentation

## 2023-12-12 DIAGNOSIS — M25561 Pain in right knee: Secondary | ICD-10-CM | POA: Insufficient documentation

## 2023-12-12 DIAGNOSIS — M25562 Pain in left knee: Secondary | ICD-10-CM | POA: Insufficient documentation

## 2023-12-12 DIAGNOSIS — G4733 Obstructive sleep apnea (adult) (pediatric): Secondary | ICD-10-CM | POA: Diagnosis not present

## 2023-12-12 DIAGNOSIS — M79676 Pain in unspecified toe(s): Secondary | ICD-10-CM | POA: Diagnosis not present

## 2023-12-12 DIAGNOSIS — Z87891 Personal history of nicotine dependence: Secondary | ICD-10-CM | POA: Insufficient documentation

## 2023-12-12 DIAGNOSIS — I5022 Chronic systolic (congestive) heart failure: Secondary | ICD-10-CM | POA: Diagnosis not present

## 2023-12-12 DIAGNOSIS — I252 Old myocardial infarction: Secondary | ICD-10-CM | POA: Diagnosis not present

## 2023-12-12 DIAGNOSIS — I714 Abdominal aortic aneurysm, without rupture, unspecified: Secondary | ICD-10-CM | POA: Diagnosis not present

## 2023-12-12 DIAGNOSIS — Z955 Presence of coronary angioplasty implant and graft: Secondary | ICD-10-CM | POA: Diagnosis not present

## 2023-12-12 DIAGNOSIS — M79671 Pain in right foot: Secondary | ICD-10-CM | POA: Insufficient documentation

## 2023-12-12 DIAGNOSIS — I469 Cardiac arrest, cause unspecified: Secondary | ICD-10-CM

## 2023-12-12 DIAGNOSIS — Z8674 Personal history of sudden cardiac arrest: Secondary | ICD-10-CM | POA: Diagnosis not present

## 2023-12-12 DIAGNOSIS — E785 Hyperlipidemia, unspecified: Secondary | ICD-10-CM | POA: Insufficient documentation

## 2023-12-12 DIAGNOSIS — I472 Ventricular tachycardia, unspecified: Secondary | ICD-10-CM | POA: Insufficient documentation

## 2023-12-12 DIAGNOSIS — I110 Hypertensive heart disease with heart failure: Secondary | ICD-10-CM | POA: Diagnosis not present

## 2023-12-12 DIAGNOSIS — Z7984 Long term (current) use of oral hypoglycemic drugs: Secondary | ICD-10-CM | POA: Diagnosis not present

## 2023-12-12 DIAGNOSIS — Z79899 Other long term (current) drug therapy: Secondary | ICD-10-CM | POA: Insufficient documentation

## 2023-12-12 DIAGNOSIS — I4901 Ventricular fibrillation: Secondary | ICD-10-CM

## 2023-12-12 LAB — BASIC METABOLIC PANEL WITH GFR
Anion gap: 8 (ref 5–15)
BUN: 22 mg/dL (ref 8–23)
CO2: 22 mmol/L (ref 22–32)
Calcium: 9.6 mg/dL (ref 8.9–10.3)
Chloride: 104 mmol/L (ref 98–111)
Creatinine, Ser: 1.67 mg/dL — ABNORMAL HIGH (ref 0.61–1.24)
GFR, Estimated: 44 mL/min — ABNORMAL LOW (ref >60)
Glucose, Bld: 91 mg/dL (ref 70–99)
Potassium: 5.2 mmol/L — ABNORMAL HIGH (ref 3.5–5.1)
Sodium: 134 mmol/L — ABNORMAL LOW (ref 135–145)

## 2023-12-12 LAB — BRAIN NATRIURETIC PEPTIDE: B Natriuretic Peptide: 416.6 pg/mL — ABNORMAL HIGH (ref 0.0–100.0)

## 2023-12-12 MED ORDER — ENTRESTO 24-26 MG PO TABS
1.0000 | ORAL_TABLET | Freq: Two times a day (BID) | ORAL | 1 refills | Status: DC
Start: 2023-12-12 — End: 2024-01-21

## 2023-12-12 NOTE — Telephone Encounter (Signed)
 Attempted to call patient in regards to Cardiac Rehab - LM on VM

## 2023-12-12 NOTE — Patient Instructions (Addendum)
 Good to see you today!  STOP Losartan   START Entresto 24/26 mg tablet Twice daily  Labs done today, your results will be available in MyChart, we will contact you for abnormal reading   Follow up lab work in a week  Please follow up with our heart failure pharmacist as scheduled  Your physician recommends that you schedule a follow-up appointment  as scheduled  If you have any questions or concerns before your next appointment please send us  a message through [place] or call our office at [phone].    TO LEAVE A MESSAGE FOR THE NURSE SELECT OPTION 2, PLEASE LEAVE A MESSAGE INCLUDING: YOUR NAME DATE OF BIRTH CALL BACK NUMBER REASON FOR CALL**this is important as we prioritize the call backs  YOU WILL RECEIVE A CALL BACK THE SAME DAY AS LONG AS YOU CALL BEFORE 4:00 PM At the [facility], you and your health needs are our priority. As part of our continuing mission to provide you with exceptional heart care, we have created designated Provider Care Teams. These Care Teams include your primary Cardiologist (physician) and Advanced Practice Providers (APPs- Physician Assistants and Nurse Practitioners) who all work together to provide you with the care you need, when you need it.   You may see any of the following providers on your designated Care Team at your next follow up: Dr Jules Oar Dr Peder Bourdon Dr. Alwin Baars Dr. Arta Lark Amy Marijane Shoulders, NP Ruddy Corral, [place] [facility] [place], [place] Dennise Fitz, NP [place] Lee, NP Shawnee Dellen, NP Luster Salters, PharmD Bevely Brush, PharmD   Please be sure to bring in all your medications bottles to every appointment.    Thank you for choosing [facility]

## 2023-12-12 NOTE — Telephone Encounter (Signed)
 Advanced Heart Failure Patient Advocate Encounter  The patient was approved for a [employer] grant that will help cover the cost of Entresto, Farxiga , Spironolactone .  Total amount awarded, $4,500.  Effective: 11/12/2023 - 11/10/2024.  BIN [id number] PCN [id number] Group [id number] ID [id number]  Pharmacy provided with approval and processing information. Confirmed $0 copay for Entresto. Pt mailbox is full, unable to leave message.  Kennis Peacock, CPhT Rx Patient Advocate Phone: [phone]

## 2023-12-13 ENCOUNTER — Telehealth (HOSPITAL_COMMUNITY): Payer: Self-pay | Admitting: Cardiology

## 2023-12-13 NOTE — Telephone Encounter (Signed)
 Detailed message left with Lynnie Saucier RN at [place] To start services for [facility]. Confidential VM at [phone]

## 2023-12-19 ENCOUNTER — Encounter (HOSPITAL_COMMUNITY)

## 2023-12-20 ENCOUNTER — Ambulatory Visit (HOSPITAL_COMMUNITY)
Admission: RE | Admit: 2023-12-20 | Discharge: 2023-12-20 | Disposition: A | Discharge status: Home / Self Care | Source: Ambulatory Visit | Attending: Cardiology | Admitting: Cardiology

## 2023-12-20 DIAGNOSIS — I5022 Chronic systolic (congestive) heart failure: Secondary | ICD-10-CM | POA: Diagnosis present

## 2023-12-20 LAB — BASIC METABOLIC PANEL WITH GFR
Anion gap: 9 (ref 5–15)
BUN: 13 mg/dL (ref 8–23)
CO2: 22 mmol/L (ref 22–32)
Calcium: 9.8 mg/dL (ref 8.9–10.3)
Chloride: 109 mmol/L (ref 98–111)
Creatinine, Ser: 1.45 mg/dL — ABNORMAL HIGH (ref 0.61–1.24)
GFR, Estimated: 52 mL/min — ABNORMAL LOW (ref >60)
Glucose, Bld: 106 mg/dL — ABNORMAL HIGH (ref 70–99)
Potassium: 4.1 mmol/L (ref 3.5–5.1)
Sodium: 140 mmol/L (ref 135–145)

## 2023-12-25 ENCOUNTER — Other Ambulatory Visit: Payer: Self-pay

## 2023-12-26 ENCOUNTER — Other Ambulatory Visit: Payer: Self-pay

## 2023-12-27 ENCOUNTER — Telehealth (HOSPITAL_COMMUNITY): Payer: Self-pay | Admitting: Cardiology

## 2023-12-27 NOTE — Telephone Encounter (Signed)
 HHRN/PT called to report pt will graduate HH/PT next week.   Recommends cardiac rehab  Ok to refer or discuss at follow up

## 2023-12-27 NOTE — Telephone Encounter (Signed)
 Please refer to cardiac rehab by Dr Selinda Dales NP-C 4:49 PM

## 2023-12-28 NOTE — Addendum Note (Signed)
 Addended by: Edris Gowers on: 12/28/2023 05:04 PM   Modules accepted: Orders

## 2024-01-03 ENCOUNTER — Other Ambulatory Visit: Payer: Self-pay

## 2024-01-04 ENCOUNTER — Other Ambulatory Visit: Payer: Self-pay

## 2024-01-07 ENCOUNTER — Other Ambulatory Visit: Payer: Self-pay

## 2024-01-18 NOTE — Progress Notes (Incomplete)
 ***In Progress***    Advanced Heart Failure Clinic Note  Referring Physician: Tarry Donne Aurora, MD Primary Care: Tarry Donne Aurora, MD Primary Cardiologist: None HF Cardiologist: Toribio Fuel, MD  HPI: Ricky Bailey is a 68 y.o. male with CAD, hx VF arrest, AAA, depression, HTN, HLD, obesity, BPH and OSA.   Presented 11/20/2023 with acute inferior STEMI. He had VF arrest in the cath lab treated with defibrillator x 1, started on amiodarone  drip. Emergent cath showed thrombotic occlusion distal RCA, treated w/ balloon angioplasty and aspiration thrombectomy. Due to large vessel size and thrombus burden, no stent was placed. Overnight, he developed worsening CP and ST elevations on EKG. Urgent re-look angiography 11/21/2023 showed prox occlusion of the RCA. Revascularization attempted but extremely challenging. Despite attempts, had 99% residual stenosis throughout the vessel w/ no flow in the distal bed. Procedure aborted given completed infarct.  - ECHO 11/22/2023 showed normal LVEF 60-65%, RV mildly reduced.  - Repeat limited ECHO on 11/23/2023 showed drop in EF down to 35-40%, RV moderately reduced and HK.  - LHC 11/23/2023 showed persistent total occlusion of the prox RCA w/ TIMI 0 flow and ectatic LAD w/ evidence of new plaque rupture with 99% mid vessel stenosis and 60% segmental stenosis just proximal to the culprit. Both lesions were treated successfully with drug eluting stent.  - RHC on 5/16 showed RV dominant shock (RA 14, PA 28/6, mPCW 11, CO 4 L/min, CI 1.68 L/min/m, PAPi 1.5). He was started on dobutamine  and Norva was left in to monitor hemodynamics.  - Repeat ECHO 11/27/2023 showed LVEF 30-35%, RV moderately reduced. Dobutamine  weaned off on 11/26/2023 but restarted several days later d/t low-output symptoms. After several days, he was successfully weaned from inotrope support and tolerated GDMT. Lifevest ordered at discharge. HF regimen at discharge was losartan  25 mg daily,  spironolactone  25 mg daily, Farxiga  10 mg daily, digoxin  0.125 mg daily, and torsemide  20 mg PRN.   On 12/12/2023 he returned for post hospital follow up with APP. Overall felt good, limited by leg pain. Denied palpitations, CP, dizziness, edema, or PND/Orthopnea. SOB only with exertion. Appetite ok, reported using some salt at home but he had been making better food choices. Weight at home 244 pounds. He reported taking all medications. Denied ETOH, tobacco or drug use. Complained of pain in bilateral knees, feet, and toes that started after working with PT. Reported drinking ~70 oz fluid/day. Reported wearing Lifevest daily.  Today he returns to HF clinic for pharmacist medication titration. At last visit with APP losartan  25 mg daily was stopped, and he started Entresto  24/26 mg BID.   Overall feeling ***. Dizziness, lightheadedness, fatigue:  Chest pain or palpitations:  How is your breathing?: *** SOB: Able to complete all ADLs. Activity level ***  Weight at home pounds. Takes furosemide /torsemide /bumex *** mg *** daily.  LEE PND/Orthopnea  Appetite *** Low-salt diet:   Physical Exam Cost/affordability of meds   HF Medications: Entresto  24/26 mg BID Spironolactone  25 mg daily - maybe 12.5 mg daily. Check telephone note 12/12/23 Farxiga  10 mg daily Digoxin  0.125 mg daily Torsemide  20 mg PRN  Has the patient been experiencing any side effects to the medications prescribed?  {YES NO:22349}  Does the patient have any problems obtaining medications due to transportation or finances?   {YES [id number]*** Patient has [employer].  Understanding of regimen: {excellent/good/fair/poor:19665} Understanding of indications: {excellent/good/fair/poor:19665} Potential of compliance: {excellent/good/fair/poor:19665} Patient understands to avoid NSAIDs. Patient understands to avoid decongestants.  Pertinent Lab Values: 12/20/2023: Serum creatinine 1.45,  BUN 13, Potassium 4.1, Sodium 140, BNP 416.6 (12/12/2023)  Vital Signs: Weight: *** lbs (last clinic weight: 249.6 lbs) Blood pressure: *** mmHg Heart rate: *** bpm  Assessment/Plan: CAD with acute inferior STEMI and RV infarct - LHC 5/13: thrombotic occlusion of large caliber RCA s/p balloon angio and aspiration thrombectomy. Minimal disease in LAD and LCX. C/B VF arrest in cath lab. - Repeat LHC 5/14: unsuccessful PCI. 99% RCA stenosis  - Recurrent CP and ECG changes on 5/16 -> cath with 99% LAD stenosis due to new ruptured plaque and occluded RCA -> DES x 2 to proximal LAD, no recurrent attempt on RCA.  RHC with severe RV failure shock. - No further chest pain. *** - Continue ASA 81 mg  + prasugrel  10 mg + rosuvastatin  40 mg daily   2. Recent cardiogenic shock due to STEMI and RV infarct - ECHO 11/22/23 EF 60-65 - ECHO 5/16 EF 35-40% RV moderately down - Repeat Limited ECHO 5/20, EF 30-35%, RV mod reduced systolic function.   - DBA off 05/19. Failed wean and restarted on 11/29/2023. Weaned off DBA again on 12/04/2023.  - NYHA ***. Volume status *** - Continue Torsemide  20 mg PRN and Potassium chloride  20 mEq PRN. - No beta blocker d/t recent shock as above  - Continue Entresto  24-26 mg BID.  - Continue spironolactone  25 mg daily  - Continue Farxiga  10 mg daily  - Continue digoxin  0.125 mcg daily - Compliant with LifeVest ***   3. VF arrest, PVCs/NSVT - in the setting of acute STEMI and failed revascularization attempts - Amiodarone  stopped 11/24/2023 - No further arrhythmias *** - No beta blocker due to cardiogenic shock. *** - LifeVest as above. ***   4. Ascending Aortic Aneurysm - Measuring 4.2 mm - Stable   Follow up in 1 month with Dr. Cherrie. ECHO at this time.    Tinnie Redman, PharmD, BCPS, BCCP, CPP Heart Failure Clinic Pharmacist [phone]

## 2024-01-21 ENCOUNTER — Ambulatory Visit (HOSPITAL_COMMUNITY)
Admission: RE | Admit: 2024-01-21 | Discharge: 2024-01-21 | Disposition: A | Discharge status: Home / Self Care | Source: Ambulatory Visit | Attending: Internal Medicine | Admitting: Internal Medicine

## 2024-01-21 ENCOUNTER — Other Ambulatory Visit: Payer: Self-pay

## 2024-01-21 VITALS — BP 118/82 | HR 79 | Wt 251.8 lb

## 2024-01-21 DIAGNOSIS — I252 Old myocardial infarction: Secondary | ICD-10-CM | POA: Diagnosis not present

## 2024-01-21 DIAGNOSIS — I493 Ventricular premature depolarization: Secondary | ICD-10-CM | POA: Diagnosis not present

## 2024-01-21 DIAGNOSIS — I10 Essential (primary) hypertension: Secondary | ICD-10-CM | POA: Diagnosis present

## 2024-01-21 DIAGNOSIS — G4733 Obstructive sleep apnea (adult) (pediatric): Secondary | ICD-10-CM | POA: Insufficient documentation

## 2024-01-21 DIAGNOSIS — I5022 Chronic systolic (congestive) heart failure: Secondary | ICD-10-CM | POA: Diagnosis not present

## 2024-01-21 DIAGNOSIS — I4729 Other ventricular tachycardia: Secondary | ICD-10-CM | POA: Insufficient documentation

## 2024-01-21 DIAGNOSIS — I7121 Aneurysm of the ascending aorta, without rupture: Secondary | ICD-10-CM | POA: Diagnosis not present

## 2024-01-21 DIAGNOSIS — Z8674 Personal history of sudden cardiac arrest: Secondary | ICD-10-CM | POA: Diagnosis not present

## 2024-01-21 DIAGNOSIS — I2510 Atherosclerotic heart disease of native coronary artery without angina pectoris: Secondary | ICD-10-CM | POA: Insufficient documentation

## 2024-01-21 DIAGNOSIS — F32A Depression, unspecified: Secondary | ICD-10-CM | POA: Insufficient documentation

## 2024-01-21 DIAGNOSIS — E785 Hyperlipidemia, unspecified: Secondary | ICD-10-CM | POA: Insufficient documentation

## 2024-01-21 DIAGNOSIS — Z79899 Other long term (current) drug therapy: Secondary | ICD-10-CM | POA: Insufficient documentation

## 2024-01-21 DIAGNOSIS — E669 Obesity, unspecified: Secondary | ICD-10-CM | POA: Insufficient documentation

## 2024-01-21 DIAGNOSIS — N400 Enlarged prostate without lower urinary tract symptoms: Secondary | ICD-10-CM | POA: Insufficient documentation

## 2024-01-21 MED ORDER — SACUBITRIL-VALSARTAN 49-51 MG PO TABS
1.0000 | ORAL_TABLET | Freq: Two times a day (BID) | ORAL | 3 refills | Status: AC
  Filled 2024-01-21 – 2024-02-05 (×2): qty 180, 90d supply, fill #0
  Filled 2024-04-29: qty 180, 90d supply, fill #1
  Filled 2024-07-28: qty 60, 30d supply, fill #2

## 2024-01-21 NOTE — Progress Notes (Signed)
 Advanced Heart Failure Clinic Note   Referring Physician: Tarry Donne Aurora, MD Primary Care: Tarry Donne Aurora, MD Primary Cardiologist: None HF Cardiologist: Toribio Fuel, MD  HPI: Ricky Bailey is a 68 y.o. male with CAD, hx VF arrest, AAA, depression, HTN, HLD, obesity, BPH and OSA.   Presented 11/20/2023 with acute inferior STEMI. He had VF arrest in the cath lab treated with defibrillator x 1, started on amiodarone  drip. Emergent cath showed thrombotic occlusion of a very large distal RCA, treated w/ balloon angioplasty and aspiration thrombectomy. Due to large vessel size and thrombus burden, no stent was placed. Overnight, he developed worsening CP and ST elevations on EKG. Urgent re-look angiography 11/21/2023 showed prox occlusion of the RCA. Revascularization attempted but extremely challenging. Despite attempts, had 99% residual stenosis throughout the vessel w/ no flow in the distal bed.  Procedure aborted given completed infarct. Initial echo 11/22/2023 showed normal LVEF 60-65%, RV mildly reduced. On 11/23/2023, patient developed hypotension and endorsed continuous 5/10 CP w/ new anteroseptal Q waves. Repeat limited echo showed drop in EF down to 35-40%, RV moderately reduced and HK. Went for coronary angiography which showed persistent total occlusion of the prox RCA w/ TIMI 0 flow and ectatic LAD w/ evidence of new plaque rupture with 99% mid vessel stenosis and 60% segmental stenosis just proximal to the culprit. Both lesions were treated successfully w/ DES. RHC c/w RV dominant shock (RA 14, PA 28/6, mPCW 11, CO 4 L/min, CI 1.68 L/min/m, PAPi 1.5). He was started on dobutamine  and Norva was left in to monitor hemodynamics. Repeat ECHO 11/27/2023 showed 30-35%, RV moderately reduced. Dobutamine  weaned off on 11/26/2023 but restarted several days later due to low-output symptoms. After several days, he was successfully weaned from inotrope support and tolerated GDMT. Lifevest  ordered at discharge. HF regimen at discharge was losartan  25 mg daily, spironolactone  25 mg daily, Farxiga  10 mg daily, digoxin  0.125 mg daily, and torsemide  20 mg PRN.   On 12/12/2023 he returned for post hospital follow up with APP. Overall felt good, limited by leg pain. Denied palpitations, CP, dizziness, edema, or PND/Orthopnea. SOB only with exertion. Appetite ok, reported using some salt at home but he had been making better food choices. Weight at home 244 pounds. He reported taking all medications. Denied ETOH, tobacco or drug use. Complained of pain in bilateral knees, feet, and toes that started after working with PT. Reported drinking ~70 oz fluid/day. Reported wearing Lifevest daily.  Today he returns to HF clinic for pharmacist medication titration. At last visit with APP losartan  25 mg daily was stopped, and he started Entresto  24/26 mg BID. Says he is overall feeling great. He is hoping to start moving around more. Denies dizziness, lightheadedness, chest pain, or palpitations. Says his breathing has been good. Denies SOB. He has been able to go for walks each morning, consisting of 6-7 minute walk to a bench and then back, 3-4 times. He feels he is able to do this well without much fatigue. Weight at home ~243 lbs with LifeVest on. He has not needed any PRN torsemide . No LEE, PND/orthopnea. Compliant with LifeVest. Appetite has been good, trying to stay away from salt. Asking about cardiac rehab today, has been referred previously but had to wait until after discharged from home health. Spoke with cardiac rehab recently and they were told to call them if HF still wanted him to complete cardiac rehab after this visit.    HF Medications: Entresto  24/26  mg BID Spironolactone  25 mg daily  Farxiga  10 mg daily Digoxin  0.125 mg daily Torsemide  20 mg PRN  Has the patient been experiencing any side effects to the medications prescribed?  No  Does the patient have any problems obtaining  medications due to transportation or finances?   No. Patient has [employer].  Understanding of regimen: fair Understanding of indications: fair Potential of compliance: good Patient understands to avoid NSAIDs. Patient understands to avoid decongestants.    Pertinent Lab Values: 12/20/2023: Serum creatinine 1.45, BUN 13, Potassium 4.1, Sodium 140, BNP 416.6 (12/12/2023)  Vital Signs: Weight: 251.8 lbs (last clinic weight: 249.6 lbs) Blood pressure: 118/82 mmHg Heart rate: 79 bpm  Assessment/Plan: CAD with acute inferior STEMI and RV infarct - LHC 11/20/23: thrombotic occlusion of large caliber RCA s/p balloon angio and aspiration thrombectomy. Minimal disease in LAD and LCX. C/B VF arrest in cath lab. - Repeat LHC 11/21/23: unsuccessful PCI. 99% RCA stenosis  - Recurrent CP and ECG changes on 11/23/23 -> cath with 99% LAD stenosis due to new ruptured plaque and occluded RCA -> DES x 2 to proximal LAD, no recurrent attempt on RCA.  RHC with severe RV failure shock. - No reported chest pain.  - Continue ASA 81 mg  + prasugrel  10 mg + rosuvastatin  40 mg daily   2. Recent cardiogenic shock due to STEMI and RV infarct - ECHO 11/22/2023 EF 60-65 - ECHO 11/23/2023 EF 35-40% RV moderately down - Repeat Limited ECHO 11/27/2023, EF 30-35%, RV mod reduced systolic function.   - Dobutamine  off 11/26/2023. Failed wean and restarted on 11/29/2023. Weaned off dobutamine  again on 12/04/2023.  - NYHA II. Appears euvolemic on exam.  - Continue Torsemide  20 mg PRN and Potassium chloride  20 mEq PRN. - No beta blocker due to recent shock as above, can hopefully add soon.  - Increase Entresto  to 49/51 mg BID  - Continue spironolactone  25 mg daily - Labs 12/12/2023 showed K 5.2 but result was hemolyzed. Patient was called to adjust regimen to spironolactone  12.5 mg daily but patient did not receive message to decrease dose and kept taking spironolactone  25 mg daily. Labs  12/20/2023 showed K 4.1, so appropriate to continue 25 mg daily.  - Continue Farxiga  10 mg daily  - Continue digoxin  0.125 mg daily - Compliant with LifeVest - Needs to start cardiac rehab, referral still open, provided number to call today.    3. VF arrest, PVCs/NSVT - In the setting of acute STEMI and failed revascularization attempts - Amiodarone  stopped 11/24/2023 - No beta blocker due to recent cardiogenic shock. Can hopefully add soon. - LifeVest as above.  - Denies palpitations.    4. Ascending Aortic Aneurysm - Measuring 4.2 mm - Stable  Follow up in 1 month with APP. ECHO at this time as well.  Tinnie Redman, PharmD, BCPS, BCCP, CPP Heart Failure Clinic Pharmacist [phone]

## 2024-01-21 NOTE — Patient Instructions (Addendum)
 It was a pleasure seeing you today!  MEDICATIONS: -We are changing your medications today -Increase Entresto  to 49/51 mg (1 tablet) twice daily. You may take 2 tablets of the 24/26 mg strength twice daily until you receive the new strength.  -Call if you have questions about your medications.   NEXT APPOINTMENT: Return to clinic in 3 weeks with [facility]. -You can call [phone] to get scheduled for cardiac rehab. The referral is still open.   In general, to take care of your heart failure: -Limit your fluid intake to 2 Liters (half-gallon) per day.   -Limit your salt intake to ideally 2-3 grams (2000-3000 mg) per day. -Weigh yourself daily and record, and bring that weight diary to your next appointment.  (Weight gain of 2-3 pounds in 1 day typically means fluid weight.) -The medications for your heart are to help your heart and help you live longer.   -Please contact us  before stopping any of your heart medications.  Call the clinic at [phone] with questions or to reschedule future appointments.

## 2024-01-23 ENCOUNTER — Telehealth (HOSPITAL_COMMUNITY): Payer: Self-pay

## 2024-01-23 NOTE — Telephone Encounter (Signed)
 Patient called asking about cardiac referral. Informed patient he has just been cleared by our nurse and we will call him to schedule after doing an insurance verification. Went over the program with him, patient acknowledged understanding.

## 2024-01-30 ENCOUNTER — Other Ambulatory Visit: Payer: Self-pay

## 2024-01-30 ENCOUNTER — Telehealth (HOSPITAL_COMMUNITY): Payer: Self-pay

## 2024-01-30 NOTE — Telephone Encounter (Signed)
 Called patient to see if he was interested in participating in the Cardiac Rehab Program. Patient will come in for orientation on 7/31 and will attend the 6:45 exercise class.  Pensions consultant.

## 2024-01-30 NOTE — Telephone Encounter (Signed)
 Pt insurance is active and benefits verified through [facility]. Co-pay $0, DED $0/$0 met, out of pocket $0/$0 met, co-insurance 0%. No pre-authorization required. 01/30/2024 @ 1:32pm, spoke with Elnor CROME., REF# [id number].  How many CR sessions are covered? (36 visits for TCR, 72 visits for ICR)72 ICR Is this a lifetime maximum or an annual maximum? Annual Has the member used any of these services to date? No Is there a time limit (weeks/months) on start of program and/or program completion? No

## 2024-02-05 ENCOUNTER — Other Ambulatory Visit: Payer: Self-pay

## 2024-02-06 ENCOUNTER — Other Ambulatory Visit (HOSPITAL_COMMUNITY): Payer: Self-pay

## 2024-02-06 ENCOUNTER — Other Ambulatory Visit: Payer: Self-pay

## 2024-02-07 ENCOUNTER — Encounter (HOSPITAL_COMMUNITY)
Admission: RE | Admit: 2024-02-07 | Discharge: 2024-02-07 | Disposition: A | Discharge status: Home / Self Care | Source: Ambulatory Visit | Attending: Cardiovascular Disease | Admitting: Cardiovascular Disease

## 2024-02-07 ENCOUNTER — Other Ambulatory Visit: Payer: Self-pay

## 2024-02-07 VITALS — Ht 71.0 in | Wt 254.9 lb

## 2024-02-07 DIAGNOSIS — Z955 Presence of coronary angioplasty implant and graft: Secondary | ICD-10-CM | POA: Insufficient documentation

## 2024-02-07 DIAGNOSIS — I2111 ST elevation (STEMI) myocardial infarction involving right coronary artery: Secondary | ICD-10-CM | POA: Diagnosis present

## 2024-02-07 NOTE — Progress Notes (Signed)
 Cardiac Rehab Medication Review   Does the patient  feel that his/her medications are working for him/her?  yes  Has the patient been experiencing any side effects to the medications prescribed?  no  Does the patient measure his/her own blood pressure or blood glucose at home?  yes   Does the patient have any problems obtaining medications due to transportation or finances?   no  Understanding of regimen: good Understanding of indications: good Potential of compliance: good    Comments: Patient has a list of medications and has no issues with his routine. He has a BP cuff at home and checks it about once a week.     Alec GORMAN Finder 02/07/2024 8:50 AM

## 2024-02-07 NOTE — Progress Notes (Signed)
 Patient here for cardiac rehab orientation noted to have intermittent frequent bigeminal PVC's. Patient asymptomatic.  Blood pressure 112/69. Oxygen saturation 97% on room air. Medications reviewed. Taking as prescribed. Patient is wearing his life vest. Will discuss with on site provider Josefa Beauvais NP. Discussed with onsite provider Josefa Beauvais NP. Okay to proceed with exercise/ 6 minute walk test. Will continue to monitor the patient throughout  the program.Elianna Windom Elpidio Quan RN BSN

## 2024-02-07 NOTE — Progress Notes (Addendum)
 Cardiac Individual Treatment Plan  Patient Details  Name: Ricky Bailey MRN: [id number] Date of Birth: [birth year] Referring Provider:   Flowsheet Row INTENSIVE CARDIAC REHAB ORIENT from 02/07/2024 in [facility]  Referring Provider Dr. 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 Swallow whole., Disp: 30 tablet, Rfl: 12   dapagliflozin  propanediol (FARXIGA ) 10 MG TABS tablet, Take 1 tablet (10 mg total) by mouth daily., Disp: 30 tablet, Rfl: 11   digoxin  (LANOXIN ) 0.125 MG tablet, Take 1 tablet (0.125 mg total) by mouth daily., Disp: 30 tablet, Rfl: 3   finasteride  (PROSCAR ) 5 MG tablet, Take 1 tablet (5 mg total) by mouth daily., Disp: 30 tablet, Rfl: 0   potassium chloride  SA (KLOR-CON  M) 20 MEQ tablet, Take 1 tablet as needed when taking Torsemide , Disp: 30 tablet, Rfl: 3   prasugrel  (EFFIENT ) 10 MG TABS tablet, Take 1 tablet (10 mg total) by mouth daily., Disp: 30 tablet, Rfl: 3   rosuvastatin  (CRESTOR ) 40 MG tablet, Take 1 tablet (40 mg total) by mouth daily., Disp: 30 tablet, Rfl: 3   sacubitril -valsartan  (ENTRESTO ) 49-51 MG, Take 1 tablet by mouth 2 (two) times daily., Disp: 180 tablet, Rfl: 3   spironolactone  (ALDACTONE ) 25 MG tablet, Take 1 tablet (25 mg total) by mouth daily., Disp: 30 tablet, Rfl: 3   torsemide  (DEMADEX ) 20 MG tablet, Take 1 tablet as needed for leg edema, weight gain, shortness of breath,  Disp: 30 tablet, Rfl: 3   allopurinol  (ZYLOPRIM ) 300 MG tablet, Take 0.5 tablets (150 mg total) by mouth daily. (Patient taking differently: Take 300 mg by mouth daily.), Disp: 30 tablet, Rfl: 0  Past Medical History: Past Medical History:  Diagnosis Date   Back pain    BPH (benign prostatic hyperplasia)    Gout    Hyperlipidemia    Hypertension    Obesity    Sleep apnea    Vertigo 03/28/2018   with vommiting    Tobacco Use: Social History   Tobacco Use  Smoking Status Former   Current packs/day: 0.00   Types: Cigarettes   Quit date: 07/21/1977   Years since quitting: 46.5  Smokeless Tobacco Never    Labs: Review Flowsheet  More data exists      Latest Ref Rng & Units 12/01/2023 12/02/2023 12/03/2023 12/04/2023 12/05/2023  Labs for ITP Cardiac and Pulmonary Rehab  O2 Saturation % 87.3  64.2  75.3  45.1  57.6  97.5     Details       Multiple values from one day are sorted in reverse-chronological order         Capillary Blood Glucose: Lab Results  Component Value Date   GLUCAP 129 (H) 03/29/2018   GLUCAP 111 (H) 03/28/2018     Exercise Target Goals: Exercise Program Goal: Individual exercise prescription set using results from initial 6 min walk test and THRR while considering  patient's activity barriers and safety.   Exercise Prescription Goal: Initial exercise  prescription builds to 30-45 minutes a day of aerobic activity, 2-3 days per week.  Home exercise guidelines will be given to patient during program as part of exercise prescription that the participant will acknowledge.  Activity Barriers & Risk Stratification:   6 Minute Walk:  6 Minute Walk     Row Name 02/07/24 1020         6 Minute Walk   Distance 1200 feet     Walk Time 6 minutes     # of Rest Breaks 0     MPH 2.27     METS 2.53     RPE 11     Perceived Dyspnea  0     VO2 Peak 8.8     Symptoms Yes (comment)     Comments Multi-focal PVC's, bigeminal runs, patietn remained  asymptomatic     Resting HR 71 bpm     Resting BP 112/68     Resting Oxygen Saturation  97 %     Exercise Oxygen Saturation  during 6 min walk 100 %     Max Ex. HR 99 bpm     Max Ex. BP 138/78     2 Minute Post BP 118/78        Oxygen Initial Assessment:   Oxygen Re-Evaluation:   Oxygen Discharge (Final Oxygen Re-Evaluation):   Initial Exercise Prescription:  Initial Exercise Prescription - 02/07/24 1000       Date of Initial Exercise RX and Referring Provider   Date 02/07/24    Referring Provider Dr. Lonni Cash MD    Expected Discharge Date 04/30/24      Recumbant Bike   Level 1    RPM 50    Watts 15    Minutes 15    METs 1.8      NuStep   Level 1    SPM 75    Minutes 15    METs 1.9      Prescription Details   Frequency (times per week) 3    Duration Progress to 30 minutes of continuous aerobic without signs/symptoms of physical distress      Intensity   THRR 40-80% of Max Heartrate 61-122    Ratings of Perceived Exertion 11-13    Perceived Dyspnea 0-4      Progression   Progression Continue progressive overload as per policy without signs/symptoms or physical distress.      Resistance Training   Training Prescription Yes    Weight 3    Reps 10-15          Perform Capillary Blood Glucose checks as needed.  Exercise Prescription Changes:   Exercise Comments:   Exercise Goals and Review:   Exercise Goals     Row Name 02/07/24 0808             Exercise Goals   Increase Physical Activity Yes       Intervention Provide advice, education, support and counseling about physical activity/exercise needs.;Develop an individualized exercise prescription for aerobic and resistive training based on initial evaluation findings, risk stratification, comorbidities and participant's personal goals.       Expected Outcomes Short Term: Attend rehab on a regular basis to increase amount of physical activity.;Long Term: Exercising regularly at  least 3-5 days a week.;Long Term: Add in home exercise to make exercise part of routine and to increase amount of physical activity.       Increase Strength and Stamina Yes       Intervention  Provide advice, education, support and counseling about physical activity/exercise needs.;Develop an individualized exercise prescription for aerobic and resistive training based on initial evaluation findings, risk stratification, comorbidities and participant's personal goals.       Expected Outcomes Short Term: Increase workloads from initial exercise prescription for resistance, speed, and METs.;Short Term: Perform resistance training exercises routinely during rehab and add in resistance training at home;Long Term: Improve cardiorespiratory fitness, muscular endurance and strength as measured by increased METs and functional capacity ( )       Able to understand and use rate of perceived exertion (RPE) scale Yes       Intervention Provide education and explanation on how to use RPE scale       Expected Outcomes Short Term: Able to use RPE daily in rehab to express subjective intensity level;Long Term:  Able to use RPE to guide intensity level when exercising independently       Knowledge and understanding of Target Heart Rate Range (THRR) Yes       Intervention Provide education and explanation of THRR including how the numbers were predicted and where they are located for reference       Expected Outcomes Short Term: Able to state/look up THRR;Long Term: Able to use THRR to govern intensity when exercising independently;Short Term: Able to use daily as guideline for intensity in rehab       Understanding of Exercise Prescription Yes       Intervention Provide education, explanation, and written materials on patient's individual exercise prescription       Expected Outcomes Short Term: Able to explain program exercise prescription;Long Term: Able to explain home exercise prescription to exercise  independently          Exercise Goals Re-Evaluation :   Discharge Exercise Prescription (Final Exercise Prescription Changes):   Nutrition:  Target Goals: Understanding of nutrition guidelines, daily intake of sodium 1500mg , cholesterol 200mg , calories 30% from fat and 7% or less from saturated fats, daily to have 5 or more servings of fruits and vegetables.  Biometrics:  Pre Biometrics - 02/07/24 1025       Pre Biometrics   Height 5' 11 (1.803 m)    Weight 115.6 kg    Waist Circumference 45 inches    Hip Circumference 46 inches    Waist to Hip Ratio 0.98 %    BMI (Calculated) 35.56    Triceps Skinfold 24 mm    % Body Fat 34.4 %    Grip Strength 36 kg    Flexibility --   Patient unable to reach   Single Leg Stand 16 seconds           Nutrition Therapy Plan and Nutrition Goals:   Nutrition Assessments:  MEDIFICTS Score Key: >=70 Need to make dietary changes  40-70 Heart Healthy Diet <= 40 Therapeutic Level Cholesterol Diet    Picture Your Plate Scores: <59 Unhealthy dietary pattern with much room for improvement. 41-50 Dietary pattern unlikely to meet recommendations for good health and room for improvement. 51-60 More healthful dietary pattern, with some room for improvement.  >60 Healthy dietary pattern, although there may be some specific behaviors that could be improved.    Nutrition Goals Re-Evaluation:   Nutrition Goals Re-Evaluation:   Nutrition Goals Discharge (Final Nutrition Goals Re-Evaluation):   Psychosocial: Target Goals: Acknowledge presence or absence of significant depression and/or stress, maximize coping skills, provide positive support system. Participant is able to verbalize types and ability to use techniques and skills needed  for reducing stress and depression.  Initial Review & Psychosocial Screening:  Initial Psych Review & Screening - 02/07/24 0901       Initial Review   Current issues with Current Stress Concerns     Source of Stress Concerns Financial;Occupation    Comments Patient is eager to get back to work      [employer]   Good Support System? Yes   Patient has a good support system with his daughter and sons. No addititonal needs at this time     Barriers   Psychosocial barriers to participate in program The patient should benefit from training in stress management and relaxation.      Screening Interventions   Interventions Encouraged to exercise;To provide support and resources with identified psychosocial needs;Provide feedback about the scores to participant    Expected Outcomes Long Term Goal: Stressors or current issues are controlled or eliminated.;Short Term goal: Identification and review with participant of any Quality of Life or Depression concerns found by scoring the questionnaire.;Long Term goal: The participant improves quality of Life and PHQ9 Scores as seen by post scores and/or verbalization of changes          Quality of Life Scores:  Quality of Life - 02/07/24 1016       Quality of Life   Select Quality of Life      Quality of Life Scores   Health/Function Pre 25.5 %    Socioeconomic Pre 18.81 %    Psych/Spiritual Pre 24.86 %    Family Pre 27.5 %    GLOBAL Pre 24.13 %         Scores of 19 and below usually indicate a poorer quality of life in these areas.  A difference of  2-3 points is a clinically meaningful difference.  A difference of 2-3 points in the total score of the Quality of Life Index has been associated with significant improvement in overall quality of life, self-image, physical symptoms, and general health in studies assessing change in quality of life.  PHQ-9: Review Flowsheet       02/07/2024  Depression screen PHQ 2/9  Decreased Interest 1  Down, Depressed, Hopeless 0  PHQ - 2 Score 1  Altered sleeping 1  Tired, decreased energy 1  Change in appetite 2  Feeling bad or failure about yourself  1  Trouble concentrating 1  Moving  slowly or fidgety/restless 0  Suicidal thoughts 0  PHQ-9 Score 7  Difficult doing work/chores Not difficult at all   Interpretation of Total Score  Total Score Depression Severity:  1-4 = Minimal depression, 5-9 = Mild depression, 10-14 = Moderate depression, 15-19 = Moderately severe depression, 20-27 = Severe depression   Psychosocial Evaluation and Intervention:   Psychosocial Re-Evaluation:   Psychosocial Discharge (Final Psychosocial Re-Evaluation):   Vocational Rehabilitation: Provide vocational rehab assistance to qualifying candidates.   Vocational Rehab Evaluation & Intervention:  Vocational Rehab - 02/07/24 1027       Initial Vocational Rehab Evaluation & Intervention   Assessment shows need for Vocational Rehabilitation No   He has a job, just waiting on cardiology clearance for return date         Education: Education Goals: Education classes will be provided on a weekly basis, covering required topics. Participant will state understanding/return demonstration of topics presented.     Core Videos: Exercise    Move It!  Clinical staff conducted group or individual video education with verbal and written material and guidebook.  Patient learns the recommended Pritikin exercise program. Exercise with the goal of living a long, healthy life. Some of the health benefits of exercise include controlled diabetes, healthier blood pressure levels, improved cholesterol levels, improved heart and lung capacity, improved sleep, and better body composition. Everyone should speak with their doctor before starting or changing an exercise routine.  Biomechanical Limitations Clinical staff conducted group or individual video education with verbal and written material and guidebook.  Patient learns how biomechanical limitations can impact exercise and how we can mitigate and possibly overcome limitations to have an impactful and balanced exercise routine.  Body  Composition Clinical staff conducted group or individual video education with verbal and written material and guidebook.  Patient learns that body composition (ratio of muscle mass to fat mass) is a key component to assessing overall fitness, rather than body weight alone. Increased fat mass, especially visceral belly fat, can put us  at increased risk for metabolic syndrome, type 2 diabetes, heart disease, and even death. It is recommended to combine diet and exercise (cardiovascular and resistance training) to improve your body composition. Seek guidance from your physician and exercise physiologist before implementing an exercise routine.  Exercise Action Plan Clinical staff conducted group or individual video education with verbal and written material and guidebook.  Patient learns the recommended strategies to achieve and enjoy long-term exercise adherence, including variety, self-motivation, self-efficacy, and positive decision making. Benefits of exercise include fitness, good health, weight management, more energy, better sleep, less stress, and overall well-being.  Medical   Heart Disease Risk Reduction Clinical staff conducted group or individual video education with verbal and written material and guidebook.  Patient learns our heart is our most vital organ as it circulates oxygen, nutrients, white blood cells, and hormones throughout the entire body, and carries waste away. Data supports a plant-based eating plan like the Pritikin Program for its effectiveness in slowing progression of and reversing heart disease. The video provides a number of recommendations to address heart disease.   Metabolic Syndrome and Belly Fat  Clinical staff conducted group or individual video education with verbal and written material and guidebook.  Patient learns what metabolic syndrome is, how it leads to heart disease, and how one can reverse it and keep it from coming back. You have metabolic syndrome if  you have 3 of the following 5 criteria: abdominal obesity, high blood pressure, high triglycerides, low HDL cholesterol, and high blood sugar.  Hypertension and Heart Disease Clinical staff conducted group or individual video education with verbal and written material and guidebook.  Patient learns that high blood pressure, or hypertension, is very common in the [place] . Hypertension is largely due to excessive salt intake, but other important risk factors include being overweight, physical inactivity, drinking too much alcohol, smoking, and not eating enough potassium from fruits and vegetables. High blood pressure is a leading risk factor for heart attack, stroke, congestive heart failure, dementia, kidney failure, and premature death. Long-term effects of excessive salt intake include stiffening of the arteries and thickening of heart muscle and organ damage. Recommendations include ways to reduce hypertension and the risk of heart disease.  Diseases of Our Time - Focusing on Diabetes Clinical staff conducted group or individual video education with verbal and written material and guidebook.  Patient learns why the best way to stop diseases of our time is prevention, through food and other lifestyle changes. Medicine (such as prescription pills and surgeries) is often only a Band-Aid on the problem, not a  long-term solution. Most common diseases of our time include obesity, type 2 diabetes, hypertension, heart disease, and cancer. The Pritikin Program is recommended and has been proven to help reduce, reverse, and/or prevent the damaging effects of metabolic syndrome.  Nutrition   Overview of the Pritikin Eating Plan  Clinical staff conducted group or individual video education with verbal and written material and guidebook.  Patient learns about the Pritikin Eating Plan for disease risk reduction. The Pritikin Eating Plan emphasizes a wide variety of unrefined, minimally-processed  carbohydrates, like fruits, vegetables, whole grains, and legumes. Go, Caution, and Stop food choices are explained. Plant-based and lean animal proteins are emphasized. Rationale provided for low sodium intake for blood pressure control, low added sugars for blood sugar stabilization, and low added fats and oils for coronary artery disease risk reduction and weight management.  Calorie Density  Clinical staff conducted group or individual video education with verbal and written material and guidebook.  Patient learns about calorie density and how it impacts the Pritikin Eating Plan. Knowing the characteristics of the food you choose will help you decide whether those foods will lead to weight gain or weight loss, and whether you want to consume more or less of them. Weight loss is usually a side effect of the Pritikin Eating Plan because of its focus on low calorie-dense foods.  Label Reading  Clinical staff conducted group or individual video education with verbal and written material and guidebook.  Patient learns about the Pritikin recommended label reading guidelines and corresponding recommendations regarding calorie density, added sugars, sodium content, and whole grains.  Dining Out - Part 1  Clinical staff conducted group or individual video education with verbal and written material and guidebook.  Patient learns that restaurant meals can be sabotaging because they can be so high in calories, fat, sodium, and/or sugar. Patient learns recommended strategies on how to positively address this and avoid unhealthy pitfalls.  Facts on Fats  Clinical staff conducted group or individual video education with verbal and written material and guidebook.  Patient learns that lifestyle modifications can be just as effective, if not more so, as many medications for lowering your risk of heart disease. A Pritikin lifestyle can help to reduce your risk of inflammation and atherosclerosis (cholesterol  build-up, or plaque, in the artery walls). Lifestyle interventions such as dietary choices and physical activity address the cause of atherosclerosis. A review of the types of fats and their impact on blood cholesterol levels, along with dietary recommendations to reduce fat intake is also included.  Nutrition Action Plan  Clinical staff conducted group or individual video education with verbal and written material and guidebook.  Patient learns how to incorporate Pritikin recommendations into their lifestyle. Recommendations include planning and keeping personal health goals in mind as an important part of their success.  Healthy Mind-Set    Healthy Minds, Bodies, Hearts  Clinical staff conducted group or individual video education with verbal and written material and guidebook.  Patient learns how to identify when they are stressed. Video will discuss the impact of that stress, as well as the many benefits of stress management. Patient will also be introduced to stress management techniques. The way we think, act, and feel has an impact on our hearts.  How Our Thoughts Can Heal Our Hearts  Clinical staff conducted group or individual video education with verbal and written material and guidebook.  Patient learns that negative thoughts can cause depression and anxiety. This can result in negative lifestyle  behavior and serious health problems. Cognitive behavioral therapy is an effective method to help control our thoughts in order to change and improve our emotional outlook.  Additional Videos:  Exercise    Improving Performance  Clinical staff conducted group or individual video education with verbal and written material and guidebook.  Patient learns to use a non-linear approach by alternating intensity levels and lengths of time spent exercising to help burn more calories and lose more body fat. Cardiovascular exercise helps improve heart health, metabolism, hormonal balance, blood sugar  control, and recovery from fatigue. Resistance training improves strength, endurance, balance, coordination, reaction time, metabolism, and muscle mass. Flexibility exercise improves circulation, posture, and balance. Seek guidance from your physician and exercise physiologist before implementing an exercise routine and learn your capabilities and proper form for all exercise.  Introduction to Yoga  Clinical staff conducted group or individual video education with verbal and written material and guidebook.  Patient learns about yoga, a discipline of the coming together of mind, breath, and body. The benefits of yoga include improved flexibility, improved range of motion, better posture and core strength, increased lung function, weight loss, and positive self-image. Yoga's heart health benefits include lowered blood pressure, healthier heart rate, decreased cholesterol and triglyceride levels, improved immune function, and reduced stress. Seek guidance from your physician and exercise physiologist before implementing an exercise routine and learn your capabilities and proper form for all exercise.  Medical   Aging: Enhancing Your Quality of Life  Clinical staff conducted group or individual video education with verbal and written material and guidebook.  Patient learns key strategies and recommendations to stay in good physical health and enhance quality of life, such as prevention strategies, having an advocate, securing a Health Care Proxy and Power of Attorney, and keeping a list of medications and system for tracking them. It also discusses how to avoid risk for bone loss.  Biology of Weight Control  Clinical staff conducted group or individual video education with verbal and written material and guidebook.  Patient learns that weight gain occurs because we consume more calories than we burn (eating more, moving less). Even if your body weight is normal, you may have higher ratios of fat compared to  muscle mass. Too much body fat puts you at increased risk for cardiovascular disease, heart attack, stroke, type 2 diabetes, and obesity-related cancers. In addition to exercise, following the Pritikin Eating Plan can help reduce your risk.  Decoding Lab Results  Clinical staff conducted group or individual video education with verbal and written material and guidebook.  Patient learns that lab test reflects one measurement whose values change over time and are influenced by many factors, including medication, stress, sleep, exercise, food, hydration, pre-existing medical conditions, and more. It is recommended to use the knowledge from this video to become more involved with your lab results and evaluate your numbers to speak with your doctor.   Diseases of Our Time - Overview  Clinical staff conducted group or individual video education with verbal and written material and guidebook.  Patient learns that according to the CDC, 50% to 70% of chronic diseases (such as obesity, type 2 diabetes, elevated lipids, hypertension, and heart disease) are avoidable through lifestyle improvements including healthier food choices, listening to satiety cues, and increased physical activity.  Sleep Disorders Clinical staff conducted group or individual video education with verbal and written material and guidebook.  Patient learns how good quality and duration of sleep are important to overall health and  well-being. Patient also learns about sleep disorders and how they impact health along with recommendations to address them, including discussing with a physician.  Nutrition  Dining Out - Part 2 Clinical staff conducted group or individual video education with verbal and written material and guidebook.  Patient learns how to plan ahead and communicate in order to maximize their dining experience in a healthy and nutritious manner. Included are recommended food choices based on the type of restaurant the patient  is visiting.   Fueling a Banker conducted group or individual video education with verbal and written material and guidebook.  There is a strong connection between our food choices and our health. Diseases like obesity and type 2 diabetes are very prevalent and are in large-part due to lifestyle choices. The Pritikin Eating Plan provides plenty of food and hunger-curbing satisfaction. It is easy to follow, affordable, and helps reduce health risks.  Menu Workshop  Clinical staff conducted group or individual video education with verbal and written material and guidebook.  Patient learns that restaurant meals can sabotage health goals because they are often packed with calories, fat, sodium, and sugar. Recommendations include strategies to plan ahead and to communicate with the manager, chef, or server to help order a healthier meal.  Planning Your Eating Strategy  Clinical staff conducted group or individual video education with verbal and written material and guidebook.  Patient learns about the Pritikin Eating Plan and its benefit of reducing the risk of disease. The Pritikin Eating Plan does not focus on calories. Instead, it emphasizes high-quality, nutrient-rich foods. By knowing the characteristics of the foods, we choose, we can determine their calorie density and make informed decisions.  Targeting Your Nutrition Priorities  Clinical staff conducted group or individual video education with verbal and written material and guidebook.  Patient learns that lifestyle habits have a tremendous impact on disease risk and progression. This video provides eating and physical activity recommendations based on your personal health goals, such as reducing LDL cholesterol, losing weight, preventing or controlling type 2 diabetes, and reducing high blood pressure.  Vitamins and Minerals  Clinical staff conducted group or individual video education with verbal and written material  and guidebook.  Patient learns different ways to obtain key vitamins and minerals, including through a recommended healthy diet. It is important to discuss all supplements you take with your doctor.   Healthy Mind-Set    Smoking Cessation  Clinical staff conducted group or individual video education with verbal and written material and guidebook.  Patient learns that cigarette smoking and tobacco addiction pose a serious health risk which affects millions of people. Stopping smoking will significantly reduce the risk of heart disease, lung disease, and many forms of cancer. Recommended strategies for quitting are covered, including working with your doctor to develop a successful plan.  Culinary   Becoming a Set designer conducted group or individual video education with verbal and written material and guidebook.  Patient learns that cooking at home can be healthy, cost-effective, quick, and puts them in control. Keys to cooking healthy recipes will include looking at your recipe, assessing your equipment needs, planning ahead, making it simple, choosing cost-effective seasonal ingredients, and limiting the use of added fats, salts, and sugars.  Cooking - Breakfast and Snacks  Clinical staff conducted group or individual video education with verbal and written material and guidebook.  Patient learns how important breakfast is to satiety and nutrition through the entire day. Recommendations  include key foods to eat during breakfast to help stabilize blood sugar levels and to prevent overeating at meals later in the day. Planning ahead is also a key component.  Cooking - Educational psychologist conducted group or individual video education with verbal and written material and guidebook.  Patient learns eating strategies to improve overall health, including an approach to cook more at home. Recommendations include thinking of animal protein as a side on your plate rather  than center stage and focusing instead on lower calorie dense options like vegetables, fruits, whole grains, and plant-based proteins, such as beans. Making sauces in large quantities to freeze for later and leaving the skin on your vegetables are also recommended to maximize your experience.  Cooking - Healthy Salads and Dressing Clinical staff conducted group or individual video education with verbal and written material and guidebook.  Patient learns that vegetables, fruits, whole grains, and legumes are the foundations of the Pritikin Eating Plan. Recommendations include how to incorporate each of these in flavorful and healthy salads, and how to create homemade salad dressings. Proper handling of ingredients is also covered. Cooking - Soups and [employer] - Soups and Desserts Clinical staff conducted group or individual video education with verbal and written material and guidebook.  Patient learns that Pritikin soups and desserts make for easy, nutritious, and delicious snacks and meal components that are low in sodium, fat, sugar, and calorie density, while high in vitamins, minerals, and filling fiber. Recommendations include simple and healthy ideas for soups and desserts.   Overview     The Pritikin Solution Program Overview Clinical staff conducted group or individual video education with verbal and written material and guidebook.  Patient learns that the results of the Pritikin Program have been documented in more than 100 articles published in peer-reviewed journals, and the benefits include reducing risk factors for (and, in some cases, even reversing) high cholesterol, high blood pressure, type 2 diabetes, obesity, and more! An overview of the three key pillars of the Pritikin Program will be covered: eating well, doing regular exercise, and having a healthy mind-set.  WORKSHOPS  Exercise: Exercise Basics: Building Your Action Plan Clinical staff led group instruction and  group discussion with PowerPoint presentation and patient guidebook. To enhance the learning environment the use of posters, models and videos may be added. At the conclusion of this workshop, patients will comprehend the difference between physical activity and exercise, as well as the benefits of incorporating both, into their routine. Patients will understand the FITT (Frequency, Intensity, Time, and Type) principle and how to use it to build an exercise action plan. In addition, safety concerns and other considerations for exercise and cardiac rehab will be addressed by the presenter. The purpose of this lesson is to promote a comprehensive and effective weekly exercise routine in order to improve patients' overall level of fitness.   Managing Heart Disease: Your Path to a Healthier Heart Clinical staff led group instruction and group discussion with PowerPoint presentation and patient guidebook. To enhance the learning environment the use of posters, models and videos may be added.At the conclusion of this workshop, patients will understand the anatomy and physiology of the heart. Additionally, they will understand how Pritikin's three pillars impact the risk factors, the progression, and the management of heart disease.  The purpose of this lesson is to provide a high-level overview of the heart, heart disease, and how the Pritikin lifestyle positively impacts risk factors.  Exercise Biomechanics  Clinical staff led group instruction and group discussion with PowerPoint presentation and patient guidebook. To enhance the learning environment the use of posters, models and videos may be added. Patients will learn how the structural parts of their bodies function and how these functions impact their daily activities, movement, and exercise. Patients will learn how to promote a neutral spine, learn how to manage pain, and identify ways to improve their physical movement in order to  promote healthy living. The purpose of this lesson is to expose patients to common physical limitations that impact physical activity. Participants will learn practical ways to adapt and manage aches and pains, and to minimize their effect on regular exercise. Patients will learn how to maintain good posture while sitting, walking, and lifting.  Balance Training and Fall Prevention  Clinical staff led group instruction and group discussion with PowerPoint presentation and patient guidebook. To enhance the learning environment the use of posters, models and videos may be added. At the conclusion of this workshop, patients will understand the importance of their sensorimotor skills (vision, proprioception, and the vestibular system) in maintaining their ability to balance as they age. Patients will apply a variety of balancing exercises that are appropriate for their current level of function. Patients will understand the common causes for poor balance, possible solutions to these problems, and ways to modify their physical environment in order to minimize their fall risk. The purpose of this lesson is to teach patients about the importance of maintaining balance as they age and ways to minimize their risk of falling.  WORKSHOPS   Nutrition:  Fueling a Ship broker led group instruction and group discussion with PowerPoint presentation and patient guidebook. To enhance the learning environment the use of posters, models and videos may be added. Patients will review the foundational principles of the Pritikin Eating Plan and understand what constitutes a serving size in each of the food groups. Patients will also learn Pritikin-friendly foods that are better choices when away from home and review make-ahead meal and snack options. Calorie density will be reviewed and applied to three nutrition priorities: weight maintenance, weight loss, and weight gain. The purpose of this lesson is to  reinforce (in a group setting) the key concepts around what patients are recommended to eat and how to apply these guidelines when away from home by planning and selecting Pritikin-friendly options. Patients will understand how calorie density may be adjusted for different weight management goals.  Mindful Eating  Clinical staff led group instruction and group discussion with PowerPoint presentation and patient guidebook. To enhance the learning environment the use of posters, models and videos may be added. Patients will briefly review the concepts of the Pritikin Eating Plan and the importance of low-calorie dense foods. The concept of mindful eating will be introduced as well as the importance of paying attention to internal hunger signals. Triggers for non-hunger eating and techniques for dealing with triggers will be explored. The purpose of this lesson is to provide patients with the opportunity to review the basic principles of the Pritikin Eating Plan, discuss the value of eating mindfully and how to measure internal cues of hunger and fullness using the Hunger Scale. Patients will also discuss reasons for non-hunger eating and learn strategies to use for controlling emotional eating.  Targeting Your Nutrition Priorities Clinical staff led group instruction and group discussion with PowerPoint presentation and patient guidebook. To enhance the learning environment the use of posters, models and videos may be added. Patients  will learn how to determine their genetic susceptibility to disease by reviewing their family history. Patients will gain insight into the importance of diet as part of an overall healthy lifestyle in mitigating the impact of genetics and other environmental insults. The purpose of this lesson is to provide patients with the opportunity to assess their personal nutrition priorities by looking at their family history, their own health history and current risk factors. Patients will  also be able to discuss ways of prioritizing and modifying the Pritikin Eating Plan for their highest risk areas  Menu  Clinical staff led group instruction and group discussion with PowerPoint presentation and patient guidebook. To enhance the learning environment the use of posters, models and videos may be added. Using menus brought in from [employer], or printed from [employer], patients will apply the Pritikin dining out guidelines that were presented in the [employer] video. Patients will also be able to practice these guidelines in a variety of provided scenarios. The purpose of this lesson is to provide patients with the opportunity to practice hands-on learning of the Pritikin Dining Out guidelines with actual menus and practice scenarios.  Label Reading Clinical staff led group instruction and group discussion with PowerPoint presentation and patient guidebook. To enhance the learning environment the use of posters, models and videos may be added. Patients will review and discuss the Pritikin label reading guidelines presented in Pritikin's Label Reading Educational series video. Using fool labels brought in from local grocery stores and markets, patients will apply the label reading guidelines and determine if the packaged food meet the Pritikin guidelines. The purpose of this lesson is to provide patients with the opportunity to review, discuss, and practice hands-on learning of the Pritikin Label Reading guidelines with actual packaged food labels. Cooking School  Pritikin's [employer] are designed to teach patients ways to prepare quick, simple, and affordable recipes at home. The importance of nutrition's role in chronic disease risk reduction is reflected in its emphasis in the overall Pritikin program. By learning how to prepare essential core Pritikin Eating Plan recipes, patients will increase control over what they eat; be able to customize the  flavor of foods without the use of added salt, sugar, or fat; and improve the quality of the food they consume. By learning a set of core recipes which are easily assembled, quickly prepared, and affordable, patients are more likely to prepare more healthy foods at home. These workshops focus on convenient breakfasts, simple entres, side dishes, and desserts which can be prepared with minimal effort and are consistent with nutrition recommendations for cardiovascular risk reduction. Cooking [employer] are taught by a Armed forces logistics/support/administrative officer (RD) who has been trained by the [employer]. The chef or RD has a clear understanding of the importance of minimizing - if not completely eliminating - added fat, sugar, and sodium in recipes. Throughout the series of Cooking School Workshop sessions, patients will learn about healthy ingredients and efficient methods of cooking to build confidence in their capability to prepare    Cooking School weekly topics:  Adding Flavor- Sodium-Free  Fast and Healthy Breakfasts  Powerhouse Plant-Based Proteins  Satisfying Salads and Dressings  Simple Sides and Sauces  International Cuisine-Spotlight on the [employer] Zones  Delicious Desserts  Savory Soups  [employer] - Meals in a Astronomer Appetizers and Snacks  Comforting Weekend Breakfasts  One-Pot Wonders   Fast Evening Meals  Landscape architect Your  Pritikin Plate  WORKSHOPS   Healthy Mindset (Psychosocial):  Focused Goals, Sustainable Changes Clinical staff led group instruction and group discussion with PowerPoint presentation and patient guidebook. To enhance the learning environment the use of posters, models and videos may be added. Patients will be able to apply effective goal setting strategies to establish at least one personal goal, and then take consistent, meaningful action toward that goal. They will learn to identify common barriers to achieving  personal goals and develop strategies to overcome them. Patients will also gain an understanding of how our mind-set can impact our ability to achieve goals and the importance of cultivating a positive and growth-oriented mind-set. The purpose of this lesson is to provide patients with a deeper understanding of how to set and achieve personal goals, as well as the tools and strategies needed to overcome common obstacles which may arise along the way.  From Head to Heart: The Power of a Healthy Outlook  Clinical staff led group instruction and group discussion with PowerPoint presentation and patient guidebook. To enhance the learning environment the use of posters, models and videos may be added. Patients will be able to recognize and describe the impact of emotions and mood on physical health. They will discover the importance of self-care and explore self-care practices which may work for them. Patients will also learn how to utilize the 4 C's to cultivate a healthier outlook and better manage stress and challenges. The purpose of this lesson is to demonstrate to patients how a healthy outlook is an essential part of maintaining good health, especially as they continue their cardiac rehab journey.  Healthy Sleep for a Healthy Heart Clinical staff led group instruction and group discussion with PowerPoint presentation and patient guidebook. To enhance the learning environment the use of posters, models and videos may be added. At the conclusion of this workshop, patients will be able to demonstrate knowledge of the importance of sleep to overall health, well-being, and quality of life. They will understand the symptoms of, and treatments for, common sleep disorders. Patients will also be able to identify daytime and nighttime behaviors which impact sleep, and they will be able to apply these tools to help manage sleep-related challenges. The purpose of this lesson is to provide patients with a general  overview of sleep and outline the importance of quality sleep. Patients will learn about a few of the most common sleep disorders. Patients will also be introduced to the concept of "sleep hygiene," and discover ways to self-manage certain sleeping problems through simple daily behavior changes. Finally, the workshop will motivate patients by clarifying the links between quality sleep and their goals of heart-healthy living.   Recognizing and Reducing Stress Clinical staff led group instruction and group discussion with PowerPoint presentation and patient guidebook. To enhance the learning environment the use of posters, models and videos may be added. At the conclusion of this workshop, patients will be able to understand the types of stress reactions, differentiate between acute and chronic stress, and recognize the impact that chronic stress has on their health. They will also be able to apply different coping mechanisms, such as reframing negative self-talk. Patients will have the opportunity to practice a variety of stress management techniques, such as deep abdominal breathing, progressive muscle relaxation, and/or guided imagery.  The purpose of this lesson is to educate patients on the role of stress in their lives and to provide healthy techniques for coping with it.  Learning Barriers/Preferences:  Learning Barriers/Preferences -  02/07/24 0902       Learning Barriers/Preferences   Learning Barriers None    Learning Preferences Skilled Demonstration;Individual Instruction;Group Instruction          Education Topics:  Knowledge Questionnaire Score:   Core Components/Risk Factors/Patient Goals at Admission:  Personal Goals and Risk Factors at Admission - 02/07/24 1028       Core Components/Risk Factors/Patient Goals on Admission    Weight Management Yes;Obesity;Weight Loss    Intervention Weight Management: Develop a combined nutrition and exercise program designed to reach desired  caloric intake, while maintaining appropriate intake of nutrient and fiber, sodium and fats, and appropriate energy expenditure required for the weight goal.;Weight Management: Provide education and appropriate resources to help participant work on and attain dietary goals.;Weight Management/Obesity: Establish reasonable short term and long term weight goals.;Obesity: Provide education and appropriate resources to help participant work on and attain dietary goals.    Admit Weight 254 lb 13.6 oz (115.6 kg)    Expected Outcomes Short Term: Continue to assess and modify interventions until short term weight is achieved;Long Term: Adherence to nutrition and physical activity/exercise program aimed toward attainment of established weight goal;Weight Loss: Understanding of general recommendations for a balanced deficit meal plan, which promotes 1-2 lb weight loss per week and includes a negative energy balance of [phone] kcal/d;Understanding recommendations for meals to include 15-35% energy as protein, 25-35% energy from fat, 35-60% energy from carbohydrates, less than 200mg  of dietary cholesterol, 20-35 gm of total fiber daily;Understanding of distribution of calorie intake throughout the day with the consumption of 4-5 meals/snacks    Heart Failure Yes    Intervention Provide a combined exercise and nutrition program that is supplemented with education, support and counseling about heart failure. Directed toward relieving symptoms such as shortness of breath, decreased exercise tolerance, and extremity edema.    Expected Outcomes Improve functional capacity of life;Short term: Attendance in program 2-3 days a week with increased exercise capacity. Reported lower sodium intake. Reported increased fruit and vegetable intake. Reports medication compliance.;Short term: Daily weights obtained and reported for increase. Utilizing diuretic protocols set by physician.;Long term: Adoption of self-care skills and reduction  of barriers for early signs and symptoms recognition and intervention leading to self-care maintenance.    Hypertension Yes    Intervention Provide education on lifestyle modifcations including regular physical activity/exercise, weight management, moderate sodium restriction and increased consumption of fresh fruit, vegetables, and low fat dairy, alcohol moderation, and smoking cessation.;Monitor prescription use compliance.    Expected Outcomes Short Term: Continued assessment and intervention until BP is < 140/71mm HG in hypertensive participants. < 130/39mm HG in hypertensive participants with diabetes, heart failure or chronic kidney disease.;Long Term: Maintenance of blood pressure at goal levels.    Lipids Yes    Intervention Provide education and support for participant on nutrition & aerobic/resistive exercise along with prescribed medications to achieve LDL 70mg , HDL >40mg .    Expected Outcomes Short Term: Participant states understanding of desired cholesterol values and is compliant with medications prescribed. Participant is following exercise prescription and nutrition guidelines.;Long Term: Cholesterol controlled with medications as prescribed, with individualized exercise RX and with personalized nutrition plan. Value goals: LDL < 70mg , HDL > 40 mg.    Stress Yes    Intervention Offer individual and/or small group education and counseling on adjustment to heart disease, stress management and health-related lifestyle change. Teach and support self-help strategies.;Refer participants experiencing significant psychosocial distress to appropriate mental health specialists for further evaluation and  treatment. When possible, include family members and significant others in education/counseling sessions.    Expected Outcomes Short Term: Participant demonstrates changes in health-related behavior, relaxation and other stress management skills, ability to obtain effective social support, and  compliance with psychotropic medications if prescribed.;Long Term: Emotional wellbeing is indicated by absence of clinically significant psychosocial distress or social isolation.    Personal Goal Other Yes    Personal Goal Plan for healthy snacking, Back to work, Independance, endurance    Intervention Will continue to monitor pt and progress workloads as tolerated without sign or symptom    Expected Outcomes Pt will achieve his goals and increase strength          Core Components/Risk Factors/Patient Goals Review:    Core Components/Risk Factors/Patient Goals at Discharge (Final Review):    ITP Comments:  ITP Comments     Row Name 02/07/24 0806           ITP Comments Wilbert Bihari, MD: Medical Director.  Introduction to the [facility] / Intensive Cardiac Rehab.  Initial orientation packet reviewed with the patient.          Comments: Participant attended orientation for the cardiac rehabilitation program on  02/07/2024  to perform initial intake and exercise walk test. Patient introduced to the [facility] education and orientation packet was reviewed. Completed 6-minute walk test, measurements, initial ITP, and exercise prescription. Vital signs stable. Telemetry-1st degree HB bundle with multifocal PVC's and bigeminal runs, asymptomatic. See nurse note for additional details.   Service time was from 0755 to 1000.

## 2024-02-08 ENCOUNTER — Telehealth (HOSPITAL_COMMUNITY): Payer: Self-pay | Admitting: Cardiology

## 2024-02-08 NOTE — Telephone Encounter (Signed)
 Pts daughter called on behalf of patient, requesting return to work status  Reports financial strain and would like to return to work ASAP  Advised-echo aand upcoming follow up is the best avenue to review request however will forward to provider in the event letter or return to work status can be addressed at this time

## 2024-02-11 ENCOUNTER — Ambulatory Visit (HOSPITAL_COMMUNITY)
Admission: RE | Admit: 2024-02-11 | Discharge: 2024-02-11 | Disposition: A | Discharge status: Home / Self Care | Source: Ambulatory Visit | Attending: Internal Medicine | Admitting: Internal Medicine

## 2024-02-11 ENCOUNTER — Other Ambulatory Visit: Payer: Self-pay

## 2024-02-11 ENCOUNTER — Encounter (HOSPITAL_COMMUNITY)
Admission: RE | Admit: 2024-02-11 | Discharge: 2024-02-11 | Disposition: A | Discharge status: Home / Self Care | Source: Ambulatory Visit | Attending: Cardiovascular Disease | Admitting: Cardiovascular Disease

## 2024-02-11 DIAGNOSIS — I493 Ventricular premature depolarization: Secondary | ICD-10-CM | POA: Diagnosis not present

## 2024-02-11 DIAGNOSIS — E669 Obesity, unspecified: Secondary | ICD-10-CM | POA: Diagnosis not present

## 2024-02-11 DIAGNOSIS — I5022 Chronic systolic (congestive) heart failure: Secondary | ICD-10-CM | POA: Insufficient documentation

## 2024-02-11 DIAGNOSIS — I7121 Aneurysm of the ascending aorta, without rupture: Secondary | ICD-10-CM | POA: Diagnosis not present

## 2024-02-11 DIAGNOSIS — I2111 ST elevation (STEMI) myocardial infarction involving right coronary artery: Secondary | ICD-10-CM

## 2024-02-11 DIAGNOSIS — I472 Ventricular tachycardia, unspecified: Secondary | ICD-10-CM | POA: Diagnosis not present

## 2024-02-11 DIAGNOSIS — I110 Hypertensive heart disease with heart failure: Secondary | ICD-10-CM | POA: Insufficient documentation

## 2024-02-11 DIAGNOSIS — Z87891 Personal history of nicotine dependence: Secondary | ICD-10-CM | POA: Diagnosis not present

## 2024-02-11 DIAGNOSIS — Z955 Presence of coronary angioplasty implant and graft: Secondary | ICD-10-CM | POA: Insufficient documentation

## 2024-02-11 DIAGNOSIS — F1010 Alcohol abuse, uncomplicated: Secondary | ICD-10-CM | POA: Diagnosis not present

## 2024-02-11 DIAGNOSIS — G4733 Obstructive sleep apnea (adult) (pediatric): Secondary | ICD-10-CM | POA: Diagnosis not present

## 2024-02-11 DIAGNOSIS — I2510 Atherosclerotic heart disease of native coronary artery without angina pectoris: Secondary | ICD-10-CM | POA: Insufficient documentation

## 2024-02-11 DIAGNOSIS — Z6835 Body mass index (BMI) 35.0-35.9, adult: Secondary | ICD-10-CM | POA: Diagnosis not present

## 2024-02-11 DIAGNOSIS — Z7982 Long term (current) use of aspirin: Secondary | ICD-10-CM | POA: Insufficient documentation

## 2024-02-11 DIAGNOSIS — Z79899 Other long term (current) drug therapy: Secondary | ICD-10-CM | POA: Insufficient documentation

## 2024-02-11 DIAGNOSIS — I252 Old myocardial infarction: Secondary | ICD-10-CM | POA: Insufficient documentation

## 2024-02-11 DIAGNOSIS — Z48812 Encounter for surgical aftercare following surgery on the circulatory system: Secondary | ICD-10-CM | POA: Diagnosis present

## 2024-02-11 LAB — ECHOCARDIOGRAM COMPLETE
Area-P 1/2: 4.29 cm2
Calc EF: 36.9 %
S' Lateral: 4.3 cm
Single Plane A2C EF: 38.2 %
Single Plane A4C EF: 45 %

## 2024-02-11 MED ORDER — PERFLUTREN LIPID MICROSPHERE
1.0000 mL | INTRAVENOUS | Status: AC | PRN
  Administered 2024-02-11: 2 mL via INTRAVENOUS

## 2024-02-11 NOTE — Progress Notes (Addendum)
 Daily Session Note  Patient Details  Name: Ricky Bailey MRN: [id number] Date of Birth: Oct 19, 1955 Referring Provider:   Flowsheet Row INTENSIVE CARDIAC REHAB ORIENT from 02/07/2024 in [facility]  Referring Provider Dr. Lonni Cash MD    Encounter Date: 02/11/2024  Check In:  Session Check In - 02/11/24 0701       Check-In   Supervising physician immediately available to respond to emergencies [facility] MD immediately available    Physician(s) Josefa Beauvais, NP    Location [facility]    Staff Present Alec Finder BS, ACSM-CEP, Exercise Physiologist;Bailey Elnor, MS, Exercise Physiologist;Kaylee Nicholaus, MS, ACSM-CEP, Exercise Physiologist;Annedrea Violetta, RN, MHA;Yarden Hillis Lennon, RN, BSN    Virtual Visit No    Medication changes reported     No    Fall or balance concerns reported    No    Tobacco Cessation No Change    Warm-up and Cool-down Performed as group-led instruction    Resistance Training Performed Yes    VAD Patient? No    PAD/SET Patient? No      Pain Assessment   Currently in Pain? No/denies    Multiple Pain Sites No          Capillary Blood Glucose: No results found for this or any previous visit (from the past 24 hours).   Exercise Prescription Changes - 02/11/24 0828       Response to Exercise   Blood Pressure (Admit) 108/82    Blood Pressure (Exercise) 124/98    Blood Pressure (Exit) 114/86    Heart Rate (Admit) 92 bpm    Heart Rate (Exercise) 116 bpm    Heart Rate (Exit) 92 bpm    Rating of Perceived Exertion (Exercise) 11    Perceived Dyspnea (Exercise) 0    Symptoms None    Comments Pt first day in the [facility] ICR program    Duration Progress to 30 minutes of  aerobic without signs/symptoms of physical distress    Intensity THRR unchanged      Progression   Progression Continue to progress workloads to maintain intensity without signs/symptoms of physical  distress.    Average METs 2      Resistance Training   Training Prescription Yes    Weight 3    Reps 10-15    Time 10 Minutes      Recumbant Bike   Level 1    RPM 64    Watts 18    Minutes 15    METs 2.2      NuStep   Level 1    SPM 72    Minutes 15    METs 1.8          Social History   Tobacco Use  Smoking Status Former   Current packs/day: 0.00   Types: Cigarettes   Quit date: 07/21/1977   Years since quitting: 46.5  Smokeless Tobacco Never    Goals Met:  Exercise tolerated well No report of concerns or symptoms today Strength training completed today  Goals Unmet:  Not Applicable  Comments: Pt started cardiac rehab today.  Pt tolerated light exercise without difficulty. VSS, telemetry-Sinus Rhythm with occasional PVCs, asymptomatic.  Medication list reconciled at orientation.  . Pt denies barriers to medication compliance.  PSYCHOSOCIAL ASSESSMENT:  PHQ-7. Pt exhibits positive coping skills, hopeful outlook with supportive family. No psychosocial needs identified at this time, no psychosocial interventions necessary.    Pt enjoys working  on vehicles and playing pool.   Pt oriented to exercise equipment and routine.    Understanding verbalized.     Dr. Wilbert Bihari is Medical Director for Cardiac Rehab at [facility]

## 2024-02-12 ENCOUNTER — Telehealth (HOSPITAL_COMMUNITY): Payer: Self-pay

## 2024-02-12 NOTE — Telephone Encounter (Signed)
 Called and spoke to pt's daughter Ricky Bailey to confirm/remind patient of their appointment at the [facility] on 02/13/24.   Appointment:   [x] Confirmed  [] Left mess   [] No answer/No voice mail  [] VM Full/unable to leave message  [] Phone not in service  Patient reminded to bring all medications and/or complete list.  Confirmed patient has transportation. Gave directions, instructed to utilize valet parking.

## 2024-02-12 NOTE — Progress Notes (Signed)
 ADVANCED HF CLINIC NOTE   Primary Care: Tarry Donne Aurora, MD HF Cardiologist: Dr. Cherrie  HPI: Ricky Bailey is a 68 y.o. male with CAD, hx VF arrest, AAA, depression, HTN, HLD, obesity, BPH and OSA.   Admitted 11/20/23 w/ acute inferior STEMI. He had VF arrest in the cath lab treated w/ Defib x 1, started on amio gtt. Emergent cath showed thrombotic occlusion of a very large d RCA, treated w/ balloon angioplasty and aspiration thrombectomy. Due to large vessel size and thrombus burden, no stent was placed. Later developed CP and ST elevations. Urgent re-look angiography 05/14 showed prox occlusion of the RCA. Revascularization attempted but extremely challenging. Despite attempts, had 99% residual stenosis throughout the vessel w/ no flow in the distal bed.  Procedure aborted given completed infarct. Initial echo 11/22/23 showed EF 60-65%, RV mildly reduced. On 5/16, pt developed hypotension and CP w/ new anteroseptal Q waves. Repeat ltd echo showed drop in EF down to 35-40%, RV moderately reduced and HK. Went for coronary angiography which showed persistent total occlusion of the prox RCA and ectatic LAD w/ evidence of new plaque rupture with 99% mid vessel stenosis and 60% segmental stenosis just proximal to the culprit. Both lesions were treated successfully w/ DES. RHC c/w RV dominant shock (RA 14, PA 28/6, mPCW 11, CO 4 L/min, CI 1.68 L/min/m, PAPi 1.5). Started on DBA gtts. Repeat echo showed EF 30-35%, RV moderately reduced. DBA gtt eventually weaned and tolerated GDMT. Lifevest ordered at d/c.   Echo 02/11/24 showed EF 40-45%, G1DD, RV moderately reduced.  Today he returns for HF follow up. Overall feeling fine. Doing well with Cardiac Rehab, no undue dyspnea. Denies increasing SOB, palpitations, abnormal bleeding, CP, dizziness, edema, or PND/Orthopnea. Appetite ok. Weight at home 245 pounds. Taking all medications. Has not needed torsemide . Wears CPAP. 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  Cardiac Studies - Echo 02/11/24: EF 40-45%, RV moderately reduced   - Ltd echo 11/27/23: EF 30-35%, interventricular septum flattened in systole, RV moderately reduced  - R/LHC 11/23/23: RA 14, PA 28/6 (21), PCWP 11, CO/Ci (Fick) 4/1.68; Ectatic LAD with evidence of new plaque rupture, 99% mid vessel stenosis and 60% segmental stenosis just proximal to the culprit lesion, both lesions treated successfully with drug-eluting stent   - Ltd echo 11/23/23: EF down to 35-40%, RV moderately reduced and HK.   - Echo 11/22/23: EF 60-65%, RV mildly reduced.  - LHC 11/21/23: prox occlusion of the RCA. Revascularization attempted but extremely challenging. Despite attempts, had 99% residual stenosis throughout the vessel w/ no flow in the distal bed.  Procedure aborted given completed infarct.  - LHC 11/20/23: balloon angioplasty and aspiration thrombectomy of dRCA, no stent  Past Medical History:  Diagnosis Date   Back pain    BPH (benign prostatic hyperplasia)    Gout    Hyperlipidemia    Hypertension    Obesity    Sleep apnea    Vertigo 03/28/2018   with vommiting   Current Outpatient Medications  Medication Sig Dispense Refill   allopurinol  (ZYLOPRIM ) 300 MG tablet Take 0.5 tablets (150 mg total) by mouth daily. 30 tablet 0   aspirin  EC 81 MG tablet Take 1 tablet (81 mg total) by mouth daily. Swallow whole. 30 tablet 12   dapagliflozin  propanediol (FARXIGA ) 10 MG TABS tablet Take 1 tablet (10 mg total) by mouth daily. 30 tablet 11   digoxin  (LANOXIN ) 0.125 MG tablet Take 1  tablet (0.125 mg total) by mouth daily. 30 tablet 3   finasteride  (PROSCAR ) 5 MG tablet Take 1 tablet (5 mg total) by mouth daily. 30 tablet 0   potassium chloride  SA (KLOR-CON  M) 20 MEQ tablet Take 1 tablet as needed when taking Torsemide  30 tablet 3   prasugrel  (EFFIENT ) 10 MG TABS tablet Take 1 tablet (10 mg total) by mouth daily. 30 tablet 3   rosuvastatin  (CRESTOR ) 40 MG tablet Take 1  tablet (40 mg total) by mouth daily. 30 tablet 3   sacubitril -valsartan  (ENTRESTO ) 49-51 MG Take 1 tablet by mouth 2 (two) times daily. 180 tablet 3   spironolactone  (ALDACTONE ) 25 MG tablet Take 1 tablet (25 mg total) by mouth daily. 30 tablet 3   torsemide  (DEMADEX ) 20 MG tablet Take 1 tablet as needed for leg edema, weight gain, shortness of breath 30 tablet 3   No current facility-administered medications for this encounter.   No Known Allergies  Social History   Socioeconomic History   Marital status: Divorced    Spouse name: Not on file   Number of children: Not on file   Years of education: Not on file   Highest education level: Not on file  Occupational History   Not on file  Tobacco Use   Smoking status: Former    Current packs/day: 0.00    Types: Cigarettes    Quit date: 07/21/1977    Years since quitting: 46.5   Smokeless tobacco: Never  Vaping Use   Vaping status: Never Used  Substance and Sexual Activity   Alcohol use: Yes    Alcohol/week: 6.0 standard drinks of alcohol    Types: 6 Cans of beer per week   Drug use: No   Sexual activity: Not on file  Other Topics Concern   Not on file  Social History Narrative   Not on file   Social Drivers of Health   Financial Resource Strain: Not on file  Food Insecurity: No Food Insecurity (11/20/2023)   Hunger Vital Sign    Worried About Running Out of Food in the Last Year: Never true    Ran Out of Food in the Last Year: Never true  Transportation Needs: No Transportation Needs (11/20/2023)   PRAPARE - Administrator, Civil Service (Medical): No    Lack of Transportation (Non-Medical): No  Physical Activity: Not on file  Stress: Not on file  Social Connections: Moderately Isolated (11/20/2023)   Social Connection and Isolation Panel    Frequency of Communication with Friends and Family: More than three times a week    Frequency of Social Gatherings with Friends and Family: Once a week    Attends  Religious Services: 1 to 4 times per year    Active Member of [employer] or Organizations: No    Attends Banker Meetings: Never    Marital Status: Divorced  Catering manager Violence: Not At Risk (11/20/2023)   Humiliation, Afraid, Rape, and Kick questionnaire    Fear of Current or Ex-Partner: No    Emotionally Abused: No    Physically Abused: No    Sexually Abused: No   Family History  Problem Relation Age of Onset   Heart disease Mother    Lung cancer Father    Colon polyps Neg Hx    Colon cancer Neg Hx    Esophageal cancer Neg Hx    Rectal cancer Neg Hx    Stomach cancer Neg Hx    Wt Readings from  Last 3 Encounters:  02/13/24 114 kg (251 lb 6.4 oz)  02/07/24 115.6 kg (254 lb 13.6 oz)  01/21/24 114.2 kg (251 lb 12.8 oz)   BP 102/70   Pulse 95   Ht 5' 11 (1.803 m)   Wt 114 kg (251 lb 6.4 oz)   SpO2 99%   BMI 35.06 kg/m   PHYSICAL EXAM: General:  NAD. No resp difficulty, walked into clinic HEENT: Normal Neck: Supple. No JVD. Cor: Regular rate & rhythm. No rubs, gallops or murmurs. Lungs: Clear, LifeVest in place Abdomen: Soft, nontender, nondistended.  Extremities: No cyanosis, clubbing, rash, edema Neuro: Alert & oriented x 3, moves all 4 extremities w/o difficulty. Affect pleasant.  ECG (personally reviewed): NSR 1AVB 83 bpm  LifeVest interrogation (personally reviewed): no treatments, average daily HR 78 bpm, average daily steps 2074  ASSESSMENT & PLAN: CAD  - with acute inferior STEMI and RV infarct 5/25 - LHC 11/20/23: thrombotic occlusion of large caliber RCA s/p balloon angio and aspiration thrombectomy. Minimal disease in LAD and LCX. C/B VF arrest in cath lab. - Repeat LHC 11/21/23: unsuccessful PCI. 99% RCA stenosis  - Recurrent CP and ECG changes on 11/23/23 -> cath with 99% LAD stenosis due to new ruptured plaque and occluded RCA -> DES x 2 to proximal LAD, no recurrent attempt on RCA.  RHC with severe RV failure shock. - No further chest pain.   - Continue ASA 81 mg  + Effient  10 mg + Crestor  40 mg daily - Continue CR   2.  Chronic Systolic Heart Failure - iCM, 2/2 STEMI w/ RV infarct - Echo 11/22/23: EF 60-65% - Echo 11/23/23: EF 35-40%, RV moderately down - Repeat Ltd Echo 11/27/23: EF 30-35%, RV mod reduced systolic function.   - Required DBA gtt. - Echo 8/25: EF 40-45%, G1DD, RV moderately reduced - Doing well. NYHA I-II, volume OK today. - With improved EF, stop digoxin  - Continue torsemide  20 mg/20 KCL PRN - Continue Farxiga  10 mg daily.  - Continue Entresto  49/51 mg bid. - Continue spironolactone   25 mg daily.  - No BP room to add back beta blocker yet. - Can remove LifeVest with improved EF - Labs today   3. 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 Due to VF arrest, cannot return to work driving x 6 months (~88/86/74). Discussed with EP and Dr. Zenaida. Discussed this today with patient and his daughter   56. Ascending Aortic Aneurysm - Measuring 4.2 mm - Stable  Follow up in 3 months with Dr. Cherrie Raisin [facility] FNP-BC 02/13/24  [facility] [address] [facility] [place] [address] [phone] (office) [phone] (fax)

## 2024-02-13 ENCOUNTER — Other Ambulatory Visit: Payer: Self-pay

## 2024-02-13 ENCOUNTER — Ambulatory Visit (HOSPITAL_BASED_OUTPATIENT_CLINIC_OR_DEPARTMENT_OTHER)
Admission: RE | Admit: 2024-02-13 | Discharge: 2024-02-13 | Disposition: A | Discharge status: Home / Self Care | Source: Ambulatory Visit | Attending: Family Medicine | Admitting: Family Medicine

## 2024-02-13 ENCOUNTER — Encounter (HOSPITAL_COMMUNITY): Payer: Self-pay

## 2024-02-13 ENCOUNTER — Ambulatory Visit (HOSPITAL_COMMUNITY): Payer: Self-pay | Admitting: Family Medicine

## 2024-02-13 ENCOUNTER — Encounter (HOSPITAL_COMMUNITY)
Admission: RE | Admit: 2024-02-13 | Discharge: 2024-02-13 | Disposition: A | Discharge status: Home / Self Care | Source: Ambulatory Visit | Attending: Cardiovascular Disease | Admitting: Cardiovascular Disease

## 2024-02-13 VITALS — BP 102/70 | HR 95 | Ht 71.0 in | Wt 251.4 lb

## 2024-02-13 DIAGNOSIS — I472 Ventricular tachycardia, unspecified: Secondary | ICD-10-CM | POA: Insufficient documentation

## 2024-02-13 DIAGNOSIS — I2111 ST elevation (STEMI) myocardial infarction involving right coronary artery: Secondary | ICD-10-CM

## 2024-02-13 DIAGNOSIS — Z48812 Encounter for surgical aftercare following surgery on the circulatory system: Secondary | ICD-10-CM | POA: Diagnosis not present

## 2024-02-13 DIAGNOSIS — I469 Cardiac arrest, cause unspecified: Secondary | ICD-10-CM | POA: Diagnosis not present

## 2024-02-13 DIAGNOSIS — Z955 Presence of coronary angioplasty implant and graft: Secondary | ICD-10-CM

## 2024-02-13 DIAGNOSIS — Z79899 Other long term (current) drug therapy: Secondary | ICD-10-CM | POA: Insufficient documentation

## 2024-02-13 DIAGNOSIS — E669 Obesity, unspecified: Secondary | ICD-10-CM | POA: Insufficient documentation

## 2024-02-13 DIAGNOSIS — I5022 Chronic systolic (congestive) heart failure: Secondary | ICD-10-CM | POA: Diagnosis not present

## 2024-02-13 DIAGNOSIS — N400 Enlarged prostate without lower urinary tract symptoms: Secondary | ICD-10-CM | POA: Insufficient documentation

## 2024-02-13 DIAGNOSIS — I110 Hypertensive heart disease with heart failure: Secondary | ICD-10-CM | POA: Diagnosis not present

## 2024-02-13 DIAGNOSIS — I2510 Atherosclerotic heart disease of native coronary artery without angina pectoris: Secondary | ICD-10-CM | POA: Diagnosis not present

## 2024-02-13 DIAGNOSIS — Z6835 Body mass index (BMI) 35.0-35.9, adult: Secondary | ICD-10-CM | POA: Insufficient documentation

## 2024-02-13 DIAGNOSIS — I252 Old myocardial infarction: Secondary | ICD-10-CM | POA: Insufficient documentation

## 2024-02-13 DIAGNOSIS — E785 Hyperlipidemia, unspecified: Secondary | ICD-10-CM | POA: Insufficient documentation

## 2024-02-13 DIAGNOSIS — Z8249 Family history of ischemic heart disease and other diseases of the circulatory system: Secondary | ICD-10-CM | POA: Insufficient documentation

## 2024-02-13 DIAGNOSIS — I4901 Ventricular fibrillation: Secondary | ICD-10-CM | POA: Insufficient documentation

## 2024-02-13 DIAGNOSIS — Z7982 Long term (current) use of aspirin: Secondary | ICD-10-CM | POA: Insufficient documentation

## 2024-02-13 DIAGNOSIS — I7121 Aneurysm of the ascending aorta, without rupture: Secondary | ICD-10-CM | POA: Insufficient documentation

## 2024-02-13 DIAGNOSIS — G4733 Obstructive sleep apnea (adult) (pediatric): Secondary | ICD-10-CM | POA: Insufficient documentation

## 2024-02-13 DIAGNOSIS — Z87891 Personal history of nicotine dependence: Secondary | ICD-10-CM | POA: Insufficient documentation

## 2024-02-13 LAB — BASIC METABOLIC PANEL WITH GFR
Anion gap: 8 (ref 5–15)
BUN: 15 mg/dL (ref 8–23)
CO2: 21 mmol/L — ABNORMAL LOW (ref 22–32)
Calcium: 9.3 mg/dL (ref 8.9–10.3)
Chloride: 108 mmol/L (ref 98–111)
Creatinine, Ser: 1.29 mg/dL — ABNORMAL HIGH (ref 0.61–1.24)
GFR, Estimated: >60 mL/min (ref >60)
Glucose, Bld: 98 mg/dL (ref 70–99)
Potassium: 4.4 mmol/L (ref 3.5–5.1)
Sodium: 137 mmol/L (ref 135–145)

## 2024-02-13 LAB — BRAIN NATRIURETIC PEPTIDE: B Natriuretic Peptide: 165.9 pg/mL — ABNORMAL HIGH (ref 0.0–100.0)

## 2024-02-13 NOTE — Patient Instructions (Signed)
 Medication Changes:  STOP DIGOXIN    Lab Work:  Labs done today, your results will be available in MyChart, we will contact you for abnormal readings.  Special Instructions // Education:  DISCONTINUE LIFEVEST----THE COMPANY WILL REACH OUT TO YOU IN REGARD TO HOW TO RETURN EQUIPMENT  Follow-Up in: 3 months with Dr. Bensimhon PLEASE CALL OUR OFFICE AROUND SEPTEMBER TO GET SCHEDULED FOR YOUR APPOINTMENT. PHONE NUMBER IS [phone] OPTION 2   At the [facility], you and your health needs are our priority. We have a designated team specialized in the treatment of Heart Failure. This Care Team includes your primary Heart Failure Specialized Cardiologist (physician), Advanced Practice Providers (APPs- Physician Assistants and Nurse Practitioners), and Pharmacist who all work together to provide you with the care you need, when you need it.   You may see any of the following providers on your designated Care Team at your next follow up:  Dr. Toribio Fuel Dr. Ezra Shuck Dr. Ria Commander Dr. Odis Brownie Greig Mosses, NP Caffie Shed, [place] [facility] [place], [place] Beckey Coe, NP [place] Lee, NP Tinnie Redman, PharmD   Please be sure to bring in all your medications bottles to every appointment.   Need to Contact Us :  If you have any questions or concerns before your next appointment please send us  a message through [place] or call our office at [phone].    TO LEAVE A MESSAGE FOR THE NURSE SELECT OPTION 2, PLEASE LEAVE A MESSAGE INCLUDING: YOUR NAME DATE OF BIRTH CALL BACK NUMBER REASON FOR CALL**this is important as we prioritize the call backs  YOU WILL RECEIVE A CALL BACK THE SAME DAY AS LONG AS YOU CALL BEFORE 4:00 PM

## 2024-02-13 NOTE — Progress Notes (Signed)
 Heart and Vascular Care Navigation  02/13/2024  Ricky Bailey 1956-04-18 [id number]  Reason for Referral: financial strain   Engaged with patient face to face for initial visit for Heart and Vascular Care Coordination.                                                                                                   Assessment:   Pt referred to CSW for assistance with financial concerns.  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  Patient had enough savings to pay for rent/utilities as well as monthly social security benefit to keep up with $850 rent and about $200 in utilities but is worried about next month and daughter had to pay for this month rent which she would be unable to do moving forward.  CSW discussed community resources for assistance but explain they would not be a likely option until he had turn off notice or eviction notice which he is hoping to avoid.  Will be able to return to work in November so will not have financial concerns following that time.   CSW discussed patient care fund referral to assist with one month rent- pt agreeable and is eligible due to food stamp assistance.  HRT/VAS Care Coordination     Patients Home Cardiology Office Heart Failure Clinic   Outpatient Care Team Social Worker   Social Worker Name: [facility], Ricky Bailey [phone]   Living arrangements for the past 2 months Apartment   Lives with: Self   Patient Current Insurance Coverage Managed Medicare   Patient Has Concern With Paying Medical Bills No   Does Patient Have Prescription Coverage? Yes   Home Assistive Devices/Equipment None   DME Agency [employer]   [facility] Agency [facility]       Social History:                                                                             SDOH Screenings   Food Insecurity: No Food Insecurity (02/13/2024)   Housing: Low Risk  (02/13/2024)  Transportation Needs: No Transportation Needs (11/20/2023)  Utilities: Not At Risk (02/13/2024)  Alcohol Screen: Medium Risk (02/09/2023)  Depression (PHQ2-9): Medium Risk (02/07/2024)  Financial Resource Strain: Medium Risk (02/13/2024)  Social Connections: Moderately Isolated (11/20/2023)  Tobacco Use: Medium Risk (02/13/2024)    SDOH Interventions: Financial Resources:  Surveyor, quantity Strain Interventions: [employer] Provided, Other (Comment) (Patient Care Fund Referral) Receives $1077/month in Social Security retirement income  Food Insecurity:  Food Insecurity Interventions: Intervention Not Indicated- receives $213/month from food stamps  Housing Insecurity:  Housing Interventions: [employer] Provided- worried about paying rent but currently up to date  Transportation:  Not assessed    Follow-up plan:    CSW will follow up with management company to get necessary paperwork to move forward with assistance.            Ricky HILARIO Leech, LCSW Clinical Social Worker [facility] Desk#: [phone] Cell#: [phone]

## 2024-02-15 ENCOUNTER — Telehealth (HOSPITAL_COMMUNITY): Payer: Self-pay | Admitting: *Deleted

## 2024-02-15 ENCOUNTER — Encounter (HOSPITAL_COMMUNITY): Admission: RE | Admit: 2024-02-15 | Source: Ambulatory Visit

## 2024-02-15 NOTE — Telephone Encounter (Signed)
 Return to work Orthoptist addressed at most recent OV 8/6

## 2024-02-15 NOTE — Progress Notes (Signed)
 Cardiac Individual Treatment Plan  Patient Details  Name: Ricky Bailey MRN: [id number] Date of Birth: Dec 03, 1955 Referring Provider:   Flowsheet Row INTENSIVE CARDIAC REHAB ORIENT from 02/07/2024 in [facility]  Referring Provider Dr. 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 Swallow whole., Disp: 30 tablet, Rfl: 12   dapagliflozin  propanediol (FARXIGA ) 10 MG TABS tablet, Take 1 tablet (10 mg total) by mouth daily., Disp: 30 tablet, Rfl: 11   finasteride  (PROSCAR ) 5 MG tablet, Take 1 tablet (5 mg total) by mouth daily., Disp: 30 tablet, Rfl: 0   potassium chloride  SA (KLOR-CON  M) 20 MEQ tablet, Take 1 tablet as needed when taking Torsemide , Disp: 30 tablet, Rfl: 3   prasugrel  (EFFIENT ) 10 MG TABS tablet, Take 1 tablet (10 mg total) by mouth daily., Disp: 30 tablet, Rfl: 3   rosuvastatin  (CRESTOR ) 40 MG tablet, Take 1 tablet (40 mg total) by mouth daily., Disp: 30 tablet, Rfl: 3   sacubitril -valsartan  (ENTRESTO ) 49-51 MG, Take 1 tablet by mouth 2 (two) times daily., Disp: 180 tablet, Rfl: 3   spironolactone  (ALDACTONE ) 25 MG tablet, Take 1 tablet (25 mg total) by mouth daily., Disp: 30 tablet, Rfl: 3   torsemide  (DEMADEX ) 20 MG tablet, Take 1 tablet as needed for leg edema, weight gain, shortness of breath, Disp: 30 tablet, Rfl: 3  Past Medical History: Past  Medical History:  Diagnosis Date   Back pain    BPH (benign prostatic hyperplasia)    Gout    Hyperlipidemia    Hypertension    Obesity    Sleep apnea    Vertigo 03/28/2018   with vommiting    Tobacco Use: Social History   Tobacco Use  Smoking Status Former   Current packs/day: 0.00   Types: Cigarettes   Quit date: 07/21/1977   Years since quitting: 46.6  Smokeless Tobacco Never    Labs: Review Flowsheet  More data exists      Latest Ref Rng & Units 12/01/2023 12/02/2023 12/03/2023 12/04/2023 12/05/2023  Labs for ITP Cardiac and Pulmonary Rehab  O2 Saturation % 87.3  64.2  75.3  45.1  57.6  97.5     Details       Multiple values from one day are sorted in reverse-chronological order         Capillary Blood Glucose: Lab Results  Component Value Date   GLUCAP 129 (H) 03/29/2018   GLUCAP 111 (H) 03/28/2018     Exercise Target Goals: Exercise Program Goal: Individual exercise prescription set using results from initial 6 min walk test and THRR while considering  patient's activity barriers and safety.   Exercise Prescription Goal: Initial exercise prescription builds to 30-45 minutes a day of aerobic activity, 2-3 days per week.  Home exercise guidelines will be given to patient during program as part of exercise prescription that the participant will acknowledge.  Activity Barriers &  Risk Stratification:  Activity Barriers & Cardiac Risk Stratification - 02/07/24 1106       Activity Barriers & Cardiac Risk Stratification   Activity Barriers Back Problems;Other (comment)    Comments gout    Cardiac Risk Stratification High          6 Minute Walk:  6 Minute Walk     Row Name 02/07/24 1020         6 Minute Walk   Phase Initial     Distance 1200 feet     Walk Time 6 minutes     # of Rest Breaks 0     MPH 2.27     METS 2.53     RPE 11     Perceived Dyspnea  0     VO2 Peak 8.8     Symptoms Yes (comment)     Comments Multi-focal PVC's, bigeminal  runs, patietn remained asymptomatic     Resting HR 71 bpm     Resting BP 112/68     Resting Oxygen Saturation  97 %     Exercise Oxygen Saturation  during 6 min walk 100 %     Max Ex. HR 99 bpm     Max Ex. BP 138/78     2 Minute Post BP 118/78        Oxygen Initial Assessment:   Oxygen Re-Evaluation:   Oxygen Discharge (Final Oxygen Re-Evaluation):   Initial Exercise Prescription:  Initial Exercise Prescription - 02/07/24 1000       Date of Initial Exercise RX and Referring Provider   Date 02/07/24    Referring Provider Dr. Lonni Cash MD    Expected Discharge Date 04/30/24      Recumbant Bike   Level 1    RPM 50    Watts 15    Minutes 15    METs 1.8      NuStep   Level 1    SPM 75    Minutes 15    METs 1.9      Prescription Details   Frequency (times per week) 3    Duration Progress to 30 minutes of continuous aerobic without signs/symptoms of physical distress      Intensity   THRR 40-80% of Max Heartrate 61-122    Ratings of Perceived Exertion 11-13    Perceived Dyspnea 0-4      Progression   Progression Continue progressive overload as per policy without signs/symptoms or physical distress.      Resistance Training   Training Prescription Yes    Weight 3    Reps 10-15          Perform Capillary Blood Glucose checks as needed.  Exercise Prescription Changes:   Exercise Prescription Changes     Row Name 02/11/24 [phone]             Response to Exercise   Blood Pressure (Admit) 108/82       Blood Pressure (Exercise) 124/98       Blood Pressure (Exit) 114/86       Heart Rate (Admit) 92 bpm       Heart Rate (Exercise) 116 bpm       Heart Rate (Exit) 92 bpm       Rating of Perceived Exertion (Exercise) 11       Perceived Dyspnea (Exercise) 0       Symptoms None       Comments Pt first day in the [employer]  Duration Progress to 30 minutes of  aerobic without signs/symptoms of physical distress       Intensity  THRR unchanged         Progression   Progression Continue to progress workloads to maintain intensity without signs/symptoms of physical distress.       Average METs 2         Resistance Training   Training Prescription Yes       Weight 3       Reps 10-15       Time 10 Minutes         Recumbant Bike   Level 1       RPM 64       Watts 18       Minutes 15       METs 2.2         NuStep   Level 1       SPM 72       Minutes 15       METs 1.8          Exercise Comments:   Exercise Comments     Row Name 02/11/24 [phone]           Exercise Comments Pt first day in the Pritikin ICR program. Pt tolerated exercise well with an average MET level of 2.0. Pt is off to a good start and is learning his THRR, RPE and ExRx          Exercise Goals and Review:   Exercise Goals     Row Name 02/07/24 0808             Exercise Goals   Increase Physical Activity Yes       Intervention Provide advice, education, support and counseling about physical activity/exercise needs.;Develop an individualized exercise prescription for aerobic and resistive training based on initial evaluation findings, risk stratification, comorbidities and participant's personal goals.       Expected Outcomes Short Term: Attend rehab on a regular basis to increase amount of physical activity.;Long Term: Exercising regularly at least 3-5 days a week.;Long Term: Add in home exercise to make exercise part of routine and to increase amount of physical activity.       Increase Strength and Stamina Yes       Intervention Provide advice, education, support and counseling about physical activity/exercise needs.;Develop an individualized exercise prescription for aerobic and resistive training based on initial evaluation findings, risk stratification, comorbidities and participant's personal goals.       Expected Outcomes Short Term: Increase workloads from initial exercise prescription for resistance, speed, and  METs.;Short Term: Perform resistance training exercises routinely during rehab and add in resistance training at home;Long Term: Improve cardiorespiratory fitness, muscular endurance and strength as measured by increased METs and functional capacity ( )       Able to understand and use rate of perceived exertion (RPE) scale Yes       Intervention Provide education and explanation on how to use RPE scale       Expected Outcomes Short Term: Able to use RPE daily in rehab to express subjective intensity level;Long Term:  Able to use RPE to guide intensity level when exercising independently       Knowledge and understanding of Target Heart Rate Range (THRR) Yes       Intervention Provide education and explanation of THRR including how the numbers were predicted and where they are located for reference  Expected Outcomes Short Term: Able to state/look up THRR;Long Term: Able to use THRR to govern intensity when exercising independently;Short Term: Able to use daily as guideline for intensity in rehab       Understanding of Exercise Prescription Yes       Intervention Provide education, explanation, and written materials on patient's individual exercise prescription       Expected Outcomes Short Term: Able to explain program exercise prescription;Long Term: Able to explain home exercise prescription to exercise independently          Exercise Goals Re-Evaluation :  Exercise Goals Re-Evaluation     Row Name 02/11/24 0830             Exercise Goal Re-Evaluation   Exercise Goals Review Increase Physical Activity;Understanding of Exercise Prescription;Increase Strength and Stamina;Knowledge and understanding of Target Heart Rate Range (THRR);Able to understand and use rate of perceived exertion (RPE) scale       Comments Pt first day in the [facility] ICR program. Pt tolerated exercise well with an average MET level of 2.0. Pt is off to a good start and is learning his THRR, RPE and ExRx        Expected Outcomes Will continue to monitor pt and progress workloads as tolerated without sign or symptom          Discharge Exercise Prescription (Final Exercise Prescription Changes):  Exercise Prescription Changes - 02/11/24 0828       Response to Exercise   Blood Pressure (Admit) 108/82    Blood Pressure (Exercise) 124/98    Blood Pressure (Exit) 114/86    Heart Rate (Admit) 92 bpm    Heart Rate (Exercise) 116 bpm    Heart Rate (Exit) 92 bpm    Rating of Perceived Exertion (Exercise) 11    Perceived Dyspnea (Exercise) 0    Symptoms None    Comments Pt first day in the [facility] ICR program    Duration Progress to 30 minutes of  aerobic without signs/symptoms of physical distress    Intensity THRR unchanged      Progression   Progression Continue to progress workloads to maintain intensity without signs/symptoms of physical distress.    Average METs 2      Resistance Training   Training Prescription Yes    Weight 3    Reps 10-15    Time 10 Minutes      Recumbant Bike   Level 1    RPM 64    Watts 18    Minutes 15    METs 2.2      NuStep   Level 1    SPM 72    Minutes 15    METs 1.8          Nutrition:  Target Goals: Understanding of nutrition guidelines, daily intake of sodium 1500mg , cholesterol 200mg , calories 30% from fat and 7% or less from saturated fats, daily to have 5 or more servings of fruits and vegetables.  Biometrics:  Pre Biometrics - 02/07/24 1025       Pre Biometrics   Height 5' 11 (1.803 m)    Weight 115.6 kg    Waist Circumference 45 inches    Hip Circumference 46 inches    Waist to Hip Ratio 0.98 %    BMI (Calculated) 35.56    Triceps Skinfold 24 mm    % Body Fat 34.4 %    Grip Strength 36 kg    Flexibility --   Patient unable to  reach   Single Leg Stand 16 seconds           Nutrition Therapy Plan and Nutrition Goals:  Nutrition Therapy & Goals - 02/11/24 1026       Nutrition Therapy   Diet Heart Healthy Diet     Drug/Food Interactions Statins/Certain Fruits      Personal Nutrition Goals   Nutrition Goal Patient to identify strategies for reducing cardiovascular risk by attending the [facility] education and nutrition series weekly.    Personal Goal #2 Patient to improve diet quality by using the plate method as a guide for meal planning to include lean protein/plant protein, fruits, vegetables, whole grains, nonfat dairy as part of a well-balanced diet.    Comments Patient has medical history of HTN, CAD, STEMI, hypercholesteremia. He has started making some dietary changes and reduced sodium intake. LDL is not at goal. A1c is in a pre-diabetic range. Patient will benefit from participation in intensive cardiac rehab for nutrition education, exercise, and lifestyle modification.      Intervention Plan   Intervention Prescribe, educate and counsel regarding individualized specific dietary modifications aiming towards targeted core components such as weight, hypertension, lipid management, diabetes, heart failure and other comorbidities.;Nutrition handout(s) given to patient.    Expected Outcomes Short Term Goal: Understand basic principles of dietary content, such as calories, fat, sodium, cholesterol and nutrients.;Long Term Goal: Adherence to prescribed nutrition plan.          Nutrition Assessments:  MEDIFICTS Score Key: >=70 Need to make dietary changes  40-70 Heart Healthy Diet <= 40 Therapeutic Level Cholesterol Diet    Picture Your Plate Scores: <59 Unhealthy dietary pattern with much room for improvement. 41-50 Dietary pattern unlikely to meet recommendations for good health and room for improvement. 51-60 More healthful dietary pattern, with some room for improvement.  >60 Healthy dietary pattern, although there may be some specific behaviors that could be improved.    Nutrition Goals Re-Evaluation:  Nutrition Goals Re-Evaluation     Row Name 02/11/24 1026             Goals    Current Weight 253 lb 15.5 oz (115.2 kg)       Comment Cr 1.45, GFR 52, LDL 122, HDL 32, A1c 5.8       Expected Outcome Patient has medical history of HTN, CAD, STEMI, hypercholesteremia. He has started making some dietary changes and reduced sodium intake. LDL is not at goal. A1c is in a pre-diabetic range. Patient will benefit from participation in intensive cardiac rehab for nutrition education, exercise, and lifestyle modification.          Nutrition Goals Re-Evaluation:  Nutrition Goals Re-Evaluation     Row Name 02/11/24 1026             Goals   Current Weight 253 lb 15.5 oz (115.2 kg)       Comment Cr 1.45, GFR 52, LDL 122, HDL 32, A1c 5.8       Expected Outcome Patient has medical history of HTN, CAD, STEMI, hypercholesteremia. He has started making some dietary changes and reduced sodium intake. LDL is not at goal. A1c is in a pre-diabetic range. Patient will benefit from participation in intensive cardiac rehab for nutrition education, exercise, and lifestyle modification.          Nutrition Goals Discharge (Final Nutrition Goals Re-Evaluation):  Nutrition Goals Re-Evaluation - 02/11/24 1026       Goals   Current Weight 253 lb 15.5  oz (115.2 kg)    Comment Cr 1.45, GFR 52, LDL 122, HDL 32, A1c 5.8    Expected Outcome Patient has medical history of HTN, CAD, STEMI, hypercholesteremia. He has started making some dietary changes and reduced sodium intake. LDL is not at goal. A1c is in a pre-diabetic range. Patient will benefit from participation in intensive cardiac rehab for nutrition education, exercise, and lifestyle modification.          Psychosocial: Target Goals: Acknowledge presence or absence of significant depression and/or stress, maximize coping skills, provide positive support system. Participant is able to verbalize types and ability to use techniques and skills needed for reducing stress and depression.  Initial Review & Psychosocial Screening:  Initial  Psych Review & Screening - 02/07/24 0901       Initial Review   Current issues with Current Stress Concerns    Source of Stress Concerns Financial;Occupation    Comments Patient is eager to get back to work      [employer]   Good Support System? Yes   Patient has a good support system with his daughter and sons. No addititonal needs at this time     Barriers   Psychosocial barriers to participate in program The patient should benefit from training in stress management and relaxation.      Screening Interventions   Interventions Encouraged to exercise;To provide support and resources with identified psychosocial needs;Provide feedback about the scores to participant    Expected Outcomes Long Term Goal: Stressors or current issues are controlled or eliminated.;Short Term goal: Identification and review with participant of any Quality of Life or Depression concerns found by scoring the questionnaire.;Long Term goal: The participant improves quality of Life and PHQ9 Scores as seen by post scores and/or verbalization of changes          Quality of Life Scores:  Quality of Life - 02/07/24 1016       Quality of Life   Select Quality of Life      Quality of Life Scores   Health/Function Pre 25.5 %    Socioeconomic Pre 18.81 %    Psych/Spiritual Pre 24.86 %    Family Pre 27.5 %    GLOBAL Pre 24.13 %         Scores of 19 and below usually indicate a poorer quality of life in these areas.  A difference of  2-3 points is a clinically meaningful difference.  A difference of 2-3 points in the total score of the Quality of Life Index has been associated with significant improvement in overall quality of life, self-image, physical symptoms, and general health in studies assessing change in quality of life.  PHQ-9: Review Flowsheet       02/07/2024  Depression screen PHQ 2/9  Decreased Interest 1  Down, Depressed, Hopeless 0  PHQ - 2 Score 1  Altered sleeping 1  Tired, decreased  energy 1  Change in appetite 2  Feeling bad or failure about yourself  1  Trouble concentrating 1  Moving slowly or fidgety/restless 0  Suicidal thoughts 0  PHQ-9 Score 7  Difficult doing work/chores Not difficult at all   Interpretation of Total Score  Total Score Depression Severity:  1-4 = Minimal depression, 5-9 = Mild depression, 10-14 = Moderate depression, 15-19 = Moderately severe depression, 20-27 = Severe depression   Psychosocial Evaluation and Intervention:   Psychosocial Re-Evaluation:  Psychosocial Re-Evaluation     Row Name 02/15/24 1013  Psychosocial Re-Evaluation   Current issues with Current Stress Concerns       Comments Lenis has not voiced any increased concerns or stressors during exercise at cardiac rehab.       Expected Outcomes Jordany will have controled or decreased stressors upon completion of cardiac rehab       Interventions Encouraged to attend Pulmonary Rehabilitation for the exercise;Encouraged to attend Cardiac Rehabilitation for the exercise;Stress management education         Initial Review   Source of Stress Concerns Financial;Occupation       Comments Patient is eager to get back to work          Psychosocial Discharge (Final Psychosocial Re-Evaluation):  Psychosocial Re-Evaluation - 02/15/24 1013       Psychosocial Re-Evaluation   Current issues with Current Stress Concerns    Comments Lennin has not voiced any increased concerns or stressors during exercise at cardiac rehab.    Expected Outcomes Koleson will have controled or decreased stressors upon completion of cardiac rehab    Interventions Encouraged to attend Pulmonary Rehabilitation for the exercise;Encouraged to attend Cardiac Rehabilitation for the exercise;Stress management education      Initial Review   Source of Stress Concerns Financial;Occupation    Comments Patient is eager to get back to work          Vocational Rehabilitation: Provide  vocational rehab assistance to qualifying candidates.   Vocational Rehab Evaluation & Intervention:  Vocational Rehab - 02/07/24 1027       Initial Vocational Rehab Evaluation & Intervention   Assessment shows need for Vocational Rehabilitation No   He has a job, just waiting on cardiology clearance for return date         Education: Education Goals: Education classes will be provided on a weekly basis, covering required topics. Participant will state understanding/return demonstration of topics presented.     Core Videos: Exercise    Move It!  Clinical staff conducted group or individual video education with verbal and written material and guidebook.  Patient learns the recommended Pritikin exercise program. Exercise with the goal of living a long, healthy life. Some of the health benefits of exercise include controlled diabetes, healthier blood pressure levels, improved cholesterol levels, improved heart and lung capacity, improved sleep, and better body composition. Everyone should speak with their doctor before starting or changing an exercise routine.  Biomechanical Limitations Clinical staff conducted group or individual video education with verbal and written material and guidebook.  Patient learns how biomechanical limitations can impact exercise and how we can mitigate and possibly overcome limitations to have an impactful and balanced exercise routine.  Body Composition Clinical staff conducted group or individual video education with verbal and written material and guidebook.  Patient learns that body composition (ratio of muscle mass to fat mass) is a key component to assessing overall fitness, rather than body weight alone. Increased fat mass, especially visceral belly fat, can put us  at increased risk for metabolic syndrome, type 2 diabetes, heart disease, and even death. It is recommended to combine diet and exercise (cardiovascular and resistance training) to improve  your body composition. Seek guidance from your physician and exercise physiologist before implementing an exercise routine.  Exercise Action Plan Clinical staff conducted group or individual video education with verbal and written material and guidebook.  Patient learns the recommended strategies to achieve and enjoy long-term exercise adherence, including variety, self-motivation, self-efficacy, and positive decision making. Benefits of exercise include fitness, good health,  weight management, more energy, better sleep, less stress, and overall well-being.  Medical   Heart Disease Risk Reduction Clinical staff conducted group or individual video education with verbal and written material and guidebook.  Patient learns our heart is our most vital organ as it circulates oxygen, nutrients, white blood cells, and hormones throughout the entire body, and carries waste away. Data supports a plant-based eating plan like the Pritikin Program for its effectiveness in slowing progression of and reversing heart disease. The video provides a number of recommendations to address heart disease.   Metabolic Syndrome and Belly Fat  Clinical staff conducted group or individual video education with verbal and written material and guidebook.  Patient learns what metabolic syndrome is, how it leads to heart disease, and how one can reverse it and keep it from coming back. You have metabolic syndrome if you have 3 of the following 5 criteria: abdominal obesity, high blood pressure, high triglycerides, low HDL cholesterol, and high blood sugar.  Hypertension and Heart Disease Clinical staff conducted group or individual video education with verbal and written material and guidebook.  Patient learns that high blood pressure, or hypertension, is very common in the [place] . Hypertension is largely due to excessive salt intake, but other important risk factors include being overweight, physical inactivity, drinking  too much alcohol, smoking, and not eating enough potassium from fruits and vegetables. High blood pressure is a leading risk factor for heart attack, stroke, congestive heart failure, dementia, kidney failure, and premature death. Long-term effects of excessive salt intake include stiffening of the arteries and thickening of heart muscle and organ damage. Recommendations include ways to reduce hypertension and the risk of heart disease.  Diseases of Our Time - Focusing on Diabetes Clinical staff conducted group or individual video education with verbal and written material and guidebook.  Patient learns why the best way to stop diseases of our time is prevention, through food and other lifestyle changes. Medicine (such as prescription pills and surgeries) is often only a Band-Aid on the problem, not a long-term solution. Most common diseases of our time include obesity, type 2 diabetes, hypertension, heart disease, and cancer. The Pritikin Program is recommended and has been proven to help reduce, reverse, and/or prevent the damaging effects of metabolic syndrome.  Nutrition   Overview of the Pritikin Eating Plan  Clinical staff conducted group or individual video education with verbal and written material and guidebook.  Patient learns about the Pritikin Eating Plan for disease risk reduction. The Pritikin Eating Plan emphasizes a wide variety of unrefined, minimally-processed carbohydrates, like fruits, vegetables, whole grains, and legumes. Go, Caution, and Stop food choices are explained. Plant-based and lean animal proteins are emphasized. Rationale provided for low sodium intake for blood pressure control, low added sugars for blood sugar stabilization, and low added fats and oils for coronary artery disease risk reduction and weight management.  Calorie Density  Clinical staff conducted group or individual video education with verbal and written material and guidebook.  Patient learns about  calorie density and how it impacts the Pritikin Eating Plan. Knowing the characteristics of the food you choose will help you decide whether those foods will lead to weight gain or weight loss, and whether you want to consume more or less of them. Weight loss is usually a side effect of the Pritikin Eating Plan because of its focus on low calorie-dense foods.  Label Reading  Clinical staff conducted group or individual video education with verbal and written material  and guidebook.  Patient learns about the Pritikin recommended label reading guidelines and corresponding recommendations regarding calorie density, added sugars, sodium content, and whole grains.  Dining Out - Part 1  Clinical staff conducted group or individual video education with verbal and written material and guidebook.  Patient learns that restaurant meals can be sabotaging because they can be so high in calories, fat, sodium, and/or sugar. Patient learns recommended strategies on how to positively address this and avoid unhealthy pitfalls.  Facts on Fats  Clinical staff conducted group or individual video education with verbal and written material and guidebook.  Patient learns that lifestyle modifications can be just as effective, if not more so, as many medications for lowering your risk of heart disease. A Pritikin lifestyle can help to reduce your risk of inflammation and atherosclerosis (cholesterol build-up, or plaque, in the artery walls). Lifestyle interventions such as dietary choices and physical activity address the cause of atherosclerosis. A review of the types of fats and their impact on blood cholesterol levels, along with dietary recommendations to reduce fat intake is also included.  Nutrition Action Plan  Clinical staff conducted group or individual video education with verbal and written material and guidebook.  Patient learns how to incorporate Pritikin recommendations into their lifestyle. Recommendations  include planning and keeping personal health goals in mind as an important part of their success.  Healthy Mind-Set    Healthy Minds, Bodies, Hearts  Clinical staff conducted group or individual video education with verbal and written material and guidebook.  Patient learns how to identify when they are stressed. Video will discuss the impact of that stress, as well as the many benefits of stress management. Patient will also be introduced to stress management techniques. The way we think, act, and feel has an impact on our hearts.  How Our Thoughts Can Heal Our Hearts  Clinical staff conducted group or individual video education with verbal and written material and guidebook.  Patient learns that negative thoughts can cause depression and anxiety. This can result in negative lifestyle behavior and serious health problems. Cognitive behavioral therapy is an effective method to help control our thoughts in order to change and improve our emotional outlook.  Additional Videos:  Exercise    Improving Performance  Clinical staff conducted group or individual video education with verbal and written material and guidebook.  Patient learns to use a non-linear approach by alternating intensity levels and lengths of time spent exercising to help burn more calories and lose more body fat. Cardiovascular exercise helps improve heart health, metabolism, hormonal balance, blood sugar control, and recovery from fatigue. Resistance training improves strength, endurance, balance, coordination, reaction time, metabolism, and muscle mass. Flexibility exercise improves circulation, posture, and balance. Seek guidance from your physician and exercise physiologist before implementing an exercise routine and learn your capabilities and proper form for all exercise.  Introduction to Yoga  Clinical staff conducted group or individual video education with verbal and written material and guidebook.  Patient learns about  yoga, a discipline of the coming together of mind, breath, and body. The benefits of yoga include improved flexibility, improved range of motion, better posture and core strength, increased lung function, weight loss, and positive self-image. Yoga's heart health benefits include lowered blood pressure, healthier heart rate, decreased cholesterol and triglyceride levels, improved immune function, and reduced stress. Seek guidance from your physician and exercise physiologist before implementing an exercise routine and learn your capabilities and proper form for all exercise.  Medical  Aging: Manufacturing systems engineer of Life  Clinical staff conducted group or individual video education with verbal and written material and guidebook.  Patient learns key strategies and recommendations to stay in good physical health and enhance quality of life, such as prevention strategies, having an advocate, securing a Health Care Proxy and Power of Attorney, and keeping a list of medications and system for tracking them. It also discusses how to avoid risk for bone loss.  Biology of Weight Control  Clinical staff conducted group or individual video education with verbal and written material and guidebook.  Patient learns that weight gain occurs because we consume more calories than we burn (eating more, moving less). Even if your body weight is normal, you may have higher ratios of fat compared to muscle mass. Too much body fat puts you at increased risk for cardiovascular disease, heart attack, stroke, type 2 diabetes, and obesity-related cancers. In addition to exercise, following the Pritikin Eating Plan can help reduce your risk.  Decoding Lab Results  Clinical staff conducted group or individual video education with verbal and written material and guidebook.  Patient learns that lab test reflects one measurement whose values change over time and are influenced by many factors, including medication, stress, sleep,  exercise, food, hydration, pre-existing medical conditions, and more. It is recommended to use the knowledge from this video to become more involved with your lab results and evaluate your numbers to speak with your doctor.   Diseases of Our Time - Overview  Clinical staff conducted group or individual video education with verbal and written material and guidebook.  Patient learns that according to the CDC, 50% to 70% of chronic diseases (such as obesity, type 2 diabetes, elevated lipids, hypertension, and heart disease) are avoidable through lifestyle improvements including healthier food choices, listening to satiety cues, and increased physical activity.  Sleep Disorders Clinical staff conducted group or individual video education with verbal and written material and guidebook.  Patient learns how good quality and duration of sleep are important to overall health and well-being. Patient also learns about sleep disorders and how they impact health along with recommendations to address them, including discussing with a physician.  Nutrition  Dining Out - Part 2 Clinical staff conducted group or individual video education with verbal and written material and guidebook.  Patient learns how to plan ahead and communicate in order to maximize their dining experience in a healthy and nutritious manner. Included are recommended food choices based on the type of restaurant the patient is visiting.   Fueling a Banker conducted group or individual video education with verbal and written material and guidebook.  There is a strong connection between our food choices and our health. Diseases like obesity and type 2 diabetes are very prevalent and are in large-part due to lifestyle choices. The Pritikin Eating Plan provides plenty of food and hunger-curbing satisfaction. It is easy to follow, affordable, and helps reduce health risks.  Menu Workshop  Clinical staff conducted group or  individual video education with verbal and written material and guidebook.  Patient learns that restaurant meals can sabotage health goals because they are often packed with calories, fat, sodium, and sugar. Recommendations include strategies to plan ahead and to communicate with the manager, chef, or server to help order a healthier meal.  Planning Your Eating Strategy  Clinical staff conducted group or individual video education with verbal and written material and guidebook.  Patient learns about the Pritikin Eating Plan  and its benefit of reducing the risk of disease. The Pritikin Eating Plan does not focus on calories. Instead, it emphasizes high-quality, nutrient-rich foods. By knowing the characteristics of the foods, we choose, we can determine their calorie density and make informed decisions.  Targeting Your Nutrition Priorities  Clinical staff conducted group or individual video education with verbal and written material and guidebook.  Patient learns that lifestyle habits have a tremendous impact on disease risk and progression. This video provides eating and physical activity recommendations based on your personal health goals, such as reducing LDL cholesterol, losing weight, preventing or controlling type 2 diabetes, and reducing high blood pressure.  Vitamins and Minerals  Clinical staff conducted group or individual video education with verbal and written material and guidebook.  Patient learns different ways to obtain key vitamins and minerals, including through a recommended healthy diet. It is important to discuss all supplements you take with your doctor.   Healthy Mind-Set    Smoking Cessation  Clinical staff conducted group or individual video education with verbal and written material and guidebook.  Patient learns that cigarette smoking and tobacco addiction pose a serious health risk which affects millions of people. Stopping smoking will significantly reduce the risk of  heart disease, lung disease, and many forms of cancer. Recommended strategies for quitting are covered, including working with your doctor to develop a successful plan.  Culinary   Becoming a Set designer conducted group or individual video education with verbal and written material and guidebook.  Patient learns that cooking at home can be healthy, cost-effective, quick, and puts them in control. Keys to cooking healthy recipes will include looking at your recipe, assessing your equipment needs, planning ahead, making it simple, choosing cost-effective seasonal ingredients, and limiting the use of added fats, salts, and sugars.  Cooking - Breakfast and Snacks  Clinical staff conducted group or individual video education with verbal and written material and guidebook.  Patient learns how important breakfast is to satiety and nutrition through the entire day. Recommendations include key foods to eat during breakfast to help stabilize blood sugar levels and to prevent overeating at meals later in the day. Planning ahead is also a key component.  Cooking - Educational psychologist conducted group or individual video education with verbal and written material and guidebook.  Patient learns eating strategies to improve overall health, including an approach to cook more at home. Recommendations include thinking of animal protein as a side on your plate rather than center stage and focusing instead on lower calorie dense options like vegetables, fruits, whole grains, and plant-based proteins, such as beans. Making sauces in large quantities to freeze for later and leaving the skin on your vegetables are also recommended to maximize your experience.  Cooking - Healthy Salads and Dressing Clinical staff conducted group or individual video education with verbal and written material and guidebook.  Patient learns that vegetables, fruits, whole grains, and legumes are the foundations of  the Pritikin Eating Plan. Recommendations include how to incorporate each of these in flavorful and healthy salads, and how to create homemade salad dressings. Proper handling of ingredients is also covered. Cooking - Soups and [employer] - Soups and Desserts Clinical staff conducted group or individual video education with verbal and written material and guidebook.  Patient learns that Pritikin soups and desserts make for easy, nutritious, and delicious snacks and meal components that are low in sodium, fat, sugar, and calorie density,  while high in vitamins, minerals, and filling fiber. Recommendations include simple and healthy ideas for soups and desserts.   Overview     The Pritikin Solution Program Overview Clinical staff conducted group or individual video education with verbal and written material and guidebook.  Patient learns that the results of the Pritikin Program have been documented in more than 100 articles published in peer-reviewed journals, and the benefits include reducing risk factors for (and, in some cases, even reversing) high cholesterol, high blood pressure, type 2 diabetes, obesity, and more! An overview of the three key pillars of the Pritikin Program will be covered: eating well, doing regular exercise, and having a healthy mind-set.  WORKSHOPS  Exercise: Exercise Basics: Building Your Action Plan Clinical staff led group instruction and group discussion with PowerPoint presentation and patient guidebook. To enhance the learning environment the use of posters, models and videos may be added. At the conclusion of this workshop, patients will comprehend the difference between physical activity and exercise, as well as the benefits of incorporating both, into their routine. Patients will understand the FITT (Frequency, Intensity, Time, and Type) principle and how to use it to build an exercise action plan. In addition, safety concerns and other considerations for  exercise and cardiac rehab will be addressed by the presenter. The purpose of this lesson is to promote a comprehensive and effective weekly exercise routine in order to improve patients' overall level of fitness.   Managing Heart Disease: Your Path to a Healthier Heart Clinical staff led group instruction and group discussion with PowerPoint presentation and patient guidebook. To enhance the learning environment the use of posters, models and videos may be added.At the conclusion of this workshop, patients will understand the anatomy and physiology of the heart. Additionally, they will understand how Pritikin's three pillars impact the risk factors, the progression, and the management of heart disease.  The purpose of this lesson is to provide a high-level overview of the heart, heart disease, and how the Pritikin lifestyle positively impacts risk factors.  Exercise Biomechanics Clinical staff led group instruction and group discussion with PowerPoint presentation and patient guidebook. To enhance the learning environment the use of posters, models and videos may be added. Patients will learn how the structural parts of their bodies function and how these functions impact their daily activities, movement, and exercise. Patients will learn how to promote a neutral spine, learn how to manage pain, and identify ways to improve their physical movement in order to promote healthy living. The purpose of this lesson is to expose patients to common physical limitations that impact physical activity. Participants will learn practical ways to adapt and manage aches and pains, and to minimize their effect on regular exercise. Patients will learn how to maintain good posture while sitting, walking, and lifting.  Balance Training and Fall Prevention  Clinical staff led group instruction and group discussion with PowerPoint presentation and patient guidebook. To enhance the learning environment the use of  posters, models and videos may be added. At the conclusion of this workshop, patients will understand the importance of their sensorimotor skills (vision, proprioception, and the vestibular system) in maintaining their ability to balance as they age. Patients will apply a variety of balancing exercises that are appropriate for their current level of function. Patients will understand the common causes for poor balance, possible solutions to these problems, and ways to modify their physical environment in order to minimize their fall risk. The purpose of this lesson is to teach  patients about the importance of maintaining balance as they age and ways to minimize their risk of falling.  WORKSHOPS   Nutrition:  Fueling a Ship broker led group instruction and group discussion with PowerPoint presentation and patient guidebook. To enhance the learning environment the use of posters, models and videos may be added. Patients will review the foundational principles of the Pritikin Eating Plan and understand what constitutes a serving size in each of the food groups. Patients will also learn Pritikin-friendly foods that are better choices when away from home and review make-ahead meal and snack options. Calorie density will be reviewed and applied to three nutrition priorities: weight maintenance, weight loss, and weight gain. The purpose of this lesson is to reinforce (in a group setting) the key concepts around what patients are recommended to eat and how to apply these guidelines when away from home by planning and selecting Pritikin-friendly options. Patients will understand how calorie density may be adjusted for different weight management goals.  Mindful Eating  Clinical staff led group instruction and group discussion with PowerPoint presentation and patient guidebook. To enhance the learning environment the use of posters, models and videos may be added. Patients will briefly review the  concepts of the Pritikin Eating Plan and the importance of low-calorie dense foods. The concept of mindful eating will be introduced as well as the importance of paying attention to internal hunger signals. Triggers for non-hunger eating and techniques for dealing with triggers will be explored. The purpose of this lesson is to provide patients with the opportunity to review the basic principles of the Pritikin Eating Plan, discuss the value of eating mindfully and how to measure internal cues of hunger and fullness using the Hunger Scale. Patients will also discuss reasons for non-hunger eating and learn strategies to use for controlling emotional eating.  Targeting Your Nutrition Priorities Clinical staff led group instruction and group discussion with PowerPoint presentation and patient guidebook. To enhance the learning environment the use of posters, models and videos may be added. Patients will learn how to determine their genetic susceptibility to disease by reviewing their family history. Patients will gain insight into the importance of diet as part of an overall healthy lifestyle in mitigating the impact of genetics and other environmental insults. The purpose of this lesson is to provide patients with the opportunity to assess their personal nutrition priorities by looking at their family history, their own health history and current risk factors. Patients will also be able to discuss ways of prioritizing and modifying the Pritikin Eating Plan for their highest risk areas  Menu  Clinical staff led group instruction and group discussion with PowerPoint presentation and patient guidebook. To enhance the learning environment the use of posters, models and videos may be added. Using menus brought in from [employer], or printed from [employer], patients will apply the Pritikin dining out guidelines that were presented in the [employer] video. Patients will also be able to  practice these guidelines in a variety of provided scenarios. The purpose of this lesson is to provide patients with the opportunity to practice hands-on learning of the Pritikin Dining Out guidelines with actual menus and practice scenarios.  Label Reading Clinical staff led group instruction and group discussion with PowerPoint presentation and patient guidebook. To enhance the learning environment the use of posters, models and videos may be added. Patients will review and discuss the Pritikin label reading guidelines presented in Pritikin's Label Reading Educational series  video. Using fool labels brought in from local grocery stores and markets, patients will apply the label reading guidelines and determine if the packaged food meet the Pritikin guidelines. The purpose of this lesson is to provide patients with the opportunity to review, discuss, and practice hands-on learning of the Pritikin Label Reading guidelines with actual packaged food labels. Cooking School  Pritikin's [employer] are designed to teach patients ways to prepare quick, simple, and affordable recipes at home. The importance of nutrition's role in chronic disease risk reduction is reflected in its emphasis in the overall Pritikin program. By learning how to prepare essential core Pritikin Eating Plan recipes, patients will increase control over what they eat; be able to customize the flavor of foods without the use of added salt, sugar, or fat; and improve the quality of the food they consume. By learning a set of core recipes which are easily assembled, quickly prepared, and affordable, patients are more likely to prepare more healthy foods at home. These workshops focus on convenient breakfasts, simple entres, side dishes, and desserts which can be prepared with minimal effort and are consistent with nutrition recommendations for cardiovascular risk reduction. Cooking [employer] are taught by a Interior and spatial designer (RD) who has been trained by the [employer]. The chef or RD has a clear understanding of the importance of minimizing - if not completely eliminating - added fat, sugar, and sodium in recipes. Throughout the series of Cooking School Workshop sessions, patients will learn about healthy ingredients and efficient methods of cooking to build confidence in their capability to prepare    Cooking School weekly topics:  Adding Flavor- Sodium-Free  Fast and Healthy Breakfasts  Powerhouse Plant-Based Proteins  Satisfying Salads and Dressings  Simple Sides and Sauces  International Cuisine-Spotlight on the [employer] Zones  Delicious Desserts  Savory Soups  [employer] - Meals in a Astronomer Appetizers and Snacks  Comforting Weekend Breakfasts  One-Pot Wonders   Fast Evening Meals  Landscape architect Your Pritikin Plate  WORKSHOPS   Healthy Mindset (Psychosocial):  Focused Goals, Sustainable Changes Clinical staff led group instruction and group discussion with PowerPoint presentation and patient guidebook. To enhance the learning environment the use of posters, models and videos may be added. Patients will be able to apply effective goal setting strategies to establish at least one personal goal, and then take consistent, meaningful action toward that goal. They will learn to identify common barriers to achieving personal goals and develop strategies to overcome them. Patients will also gain an understanding of how our mind-set can impact our ability to achieve goals and the importance of cultivating a positive and growth-oriented mind-set. The purpose of this lesson is to provide patients with a deeper understanding of how to set and achieve personal goals, as well as the tools and strategies needed to overcome common obstacles which may arise along the way.  From Head to Heart: The Power of a Healthy Outlook  Clinical staff led group instruction and  group discussion with PowerPoint presentation and patient guidebook. To enhance the learning environment the use of posters, models and videos may be added. Patients will be able to recognize and describe the impact of emotions and mood on physical health. They will discover the importance of self-care and explore self-care practices which may work for them. Patients will also learn how to utilize the 4 C's to cultivate a healthier outlook and better manage stress and challenges. The  purpose of this lesson is to demonstrate to patients how a healthy outlook is an essential part of maintaining good health, especially as they continue their cardiac rehab journey.  Healthy Sleep for a Healthy Heart Clinical staff led group instruction and group discussion with PowerPoint presentation and patient guidebook. To enhance the learning environment the use of posters, models and videos may be added. At the conclusion of this workshop, patients will be able to demonstrate knowledge of the importance of sleep to overall health, well-being, and quality of life. They will understand the symptoms of, and treatments for, common sleep disorders. Patients will also be able to identify daytime and nighttime behaviors which impact sleep, and they will be able to apply these tools to help manage sleep-related challenges. The purpose of this lesson is to provide patients with a general overview of sleep and outline the importance of quality sleep. Patients will learn about a few of the most common sleep disorders. Patients will also be introduced to the concept of "sleep hygiene," and discover ways to self-manage certain sleeping problems through simple daily behavior changes. Finally, the workshop will motivate patients by clarifying the links between quality sleep and their goals of heart-healthy living.   Recognizing and Reducing Stress Clinical staff led group instruction and group discussion with PowerPoint presentation and  patient guidebook. To enhance the learning environment the use of posters, models and videos may be added. At the conclusion of this workshop, patients will be able to understand the types of stress reactions, differentiate between acute and chronic stress, and recognize the impact that chronic stress has on their health. They will also be able to apply different coping mechanisms, such as reframing negative self-talk. Patients will have the opportunity to practice a variety of stress management techniques, such as deep abdominal breathing, progressive muscle relaxation, and/or guided imagery.  The purpose of this lesson is to educate patients on the role of stress in their lives and to provide healthy techniques for coping with it.  Learning Barriers/Preferences:  Learning Barriers/Preferences - 02/07/24 0902       Learning Barriers/Preferences   Learning Barriers None    Learning Preferences Skilled Demonstration;Individual Instruction;Group Instruction          Education Topics:  Knowledge Questionnaire Score:   Core Components/Risk Factors/Patient Goals at Admission:  Personal Goals and Risk Factors at Admission - 02/07/24 1028       Core Components/Risk Factors/Patient Goals on Admission    Weight Management Yes;Obesity;Weight Loss    Intervention Weight Management: Develop a combined nutrition and exercise program designed to reach desired caloric intake, while maintaining appropriate intake of nutrient and fiber, sodium and fats, and appropriate energy expenditure required for the weight goal.;Weight Management: Provide education and appropriate resources to help participant work on and attain dietary goals.;Weight Management/Obesity: Establish reasonable short term and long term weight goals.;Obesity: Provide education and appropriate resources to help participant work on and attain dietary goals.    Admit Weight 254 lb 13.6 oz (115.6 kg)    Expected Outcomes Short Term: Continue  to assess and modify interventions until short term weight is achieved;Long Term: Adherence to nutrition and physical activity/exercise program aimed toward attainment of established weight goal;Weight Loss: Understanding of general recommendations for a balanced deficit meal plan, which promotes 1-2 lb weight loss per week and includes a negative energy balance of [phone] kcal/d;Understanding recommendations for meals to include 15-35% energy as protein, 25-35% energy from fat, 35-60% energy from carbohydrates, less than 200mg   of dietary cholesterol, 20-35 gm of total fiber daily;Understanding of distribution of calorie intake throughout the day with the consumption of 4-5 meals/snacks    Heart Failure Yes    Intervention Provide a combined exercise and nutrition program that is supplemented with education, support and counseling about heart failure. Directed toward relieving symptoms such as shortness of breath, decreased exercise tolerance, and extremity edema.    Expected Outcomes Improve functional capacity of life;Short term: Attendance in program 2-3 days a week with increased exercise capacity. Reported lower sodium intake. Reported increased fruit and vegetable intake. Reports medication compliance.;Short term: Daily weights obtained and reported for increase. Utilizing diuretic protocols set by physician.;Long term: Adoption of self-care skills and reduction of barriers for early signs and symptoms recognition and intervention leading to self-care maintenance.    Hypertension Yes    Intervention Provide education on lifestyle modifcations including regular physical activity/exercise, weight management, moderate sodium restriction and increased consumption of fresh fruit, vegetables, and low fat dairy, alcohol moderation, and smoking cessation.;Monitor prescription use compliance.    Expected Outcomes Short Term: Continued assessment and intervention until BP is < 140/77mm HG in hypertensive  participants. < 130/39mm HG in hypertensive participants with diabetes, heart failure or chronic kidney disease.;Long Term: Maintenance of blood pressure at goal levels.    Lipids Yes    Intervention Provide education and support for participant on nutrition & aerobic/resistive exercise along with prescribed medications to achieve LDL 70mg , HDL >40mg .    Expected Outcomes Short Term: Participant states understanding of desired cholesterol values and is compliant with medications prescribed. Participant is following exercise prescription and nutrition guidelines.;Long Term: Cholesterol controlled with medications as prescribed, with individualized exercise RX and with personalized nutrition plan. Value goals: LDL < 70mg , HDL > 40 mg.    Stress Yes    Intervention Offer individual and/or small group education and counseling on adjustment to heart disease, stress management and health-related lifestyle change. Teach and support self-help strategies.;Refer participants experiencing significant psychosocial distress to appropriate mental health specialists for further evaluation and treatment. When possible, include family members and significant others in education/counseling sessions.    Expected Outcomes Short Term: Participant demonstrates changes in health-related behavior, relaxation and other stress management skills, ability to obtain effective social support, and compliance with psychotropic medications if prescribed.;Long Term: Emotional wellbeing is indicated by absence of clinically significant psychosocial distress or social isolation.    Personal Goal Other Yes    Personal Goal Plan for healthy snacking, Back to work, Independance, endurance    Intervention Will continue to monitor pt and progress workloads as tolerated without sign or symptom    Expected Outcomes Pt will achieve his goals and increase strength          Core Components/Risk Factors/Patient Goals Review:   Goals and Risk  Factor Review     Row Name 02/15/24 1015             Core Components/Risk Factors/Patient Goals Review   Personal Goals Review Weight Management/Obesity;Heart Failure;Hypertension;Lipids;Stress       Review Pt is doing well with exercise at cardiac rehab. Vital signs have been stable. Burman has begun to increase his met levels (2 sessions thus far)       Expected Outcomes Pt will continue to particpate in cardiac rehab for exercise, nutrition and lifestyle modifications          Core Components/Risk Factors/Patient Goals at Discharge (Final Review):   Goals and Risk Factor Review - 02/15/24 1015  Core Components/Risk Factors/Patient Goals Review   Personal Goals Review Weight Management/Obesity;Heart Failure;Hypertension;Lipids;Stress    Review Pt is doing well with exercise at cardiac rehab. Vital signs have been stable. Kalman has begun to increase his met levels (2 sessions thus far)    Expected Outcomes Pt will continue to particpate in cardiac rehab for exercise, nutrition and lifestyle modifications          ITP Comments:  ITP Comments     Row Name 02/07/24 0806 02/15/24 1011         ITP Comments Wilbert Bihari, MD: Medical Director.  Introduction to the [facility] / Intensive Cardiac Rehab.  Initial orientation packet reviewed with the patient. 30 Day ITP Review. Trejon has good participation with exercise at cardiac rehab when in attendance         Comments: See ITP comments

## 2024-02-15 NOTE — Telephone Encounter (Signed)
 Spoke with Maple at 9:51am regarding missing cardiac rehab class this morning. He is doing fine just got up late.

## 2024-02-15 NOTE — Addendum Note (Signed)
 Encounter addended by: Glena Harlene HERO, FNP on: 02/15/2024 3:36 PM  Actions taken: Clinical Note Signed

## 2024-02-18 ENCOUNTER — Encounter (HOSPITAL_COMMUNITY)
Admission: RE | Admit: 2024-02-18 | Discharge: 2024-02-18 | Disposition: A | Discharge status: Home / Self Care | Source: Ambulatory Visit | Attending: Cardiovascular Disease

## 2024-02-18 DIAGNOSIS — I2111 ST elevation (STEMI) myocardial infarction involving right coronary artery: Secondary | ICD-10-CM

## 2024-02-18 DIAGNOSIS — Z48812 Encounter for surgical aftercare following surgery on the circulatory system: Secondary | ICD-10-CM | POA: Diagnosis not present

## 2024-02-18 DIAGNOSIS — Z955 Presence of coronary angioplasty implant and graft: Secondary | ICD-10-CM

## 2024-02-20 ENCOUNTER — Encounter (HOSPITAL_COMMUNITY)
Admission: RE | Admit: 2024-02-20 | Discharge: 2024-02-20 | Disposition: A | Discharge status: Home / Self Care | Source: Ambulatory Visit | Attending: Cardiovascular Disease | Admitting: Cardiovascular Disease

## 2024-02-20 DIAGNOSIS — Z48812 Encounter for surgical aftercare following surgery on the circulatory system: Secondary | ICD-10-CM | POA: Diagnosis not present

## 2024-02-20 DIAGNOSIS — I2111 ST elevation (STEMI) myocardial infarction involving right coronary artery: Secondary | ICD-10-CM

## 2024-02-20 DIAGNOSIS — Z955 Presence of coronary angioplasty implant and graft: Secondary | ICD-10-CM

## 2024-02-21 ENCOUNTER — Telehealth (HOSPITAL_COMMUNITY): Payer: Self-pay | Admitting: Licensed Clinical Social Worker

## 2024-02-21 ENCOUNTER — Encounter: Payer: Self-pay | Admitting: Cardiology

## 2024-02-21 NOTE — Telephone Encounter (Signed)
 H&V Care Navigation CSW Progress Note  Clinical Social Worker called patients apartment management company again to inquire about w-9 and invoice- Education officer, museum at company with this request- awaiting required documents to move forward with check request for September rent.   SDOH Screenings   Food Insecurity: No Food Insecurity (02/13/2024)  Housing: Low Risk  (02/13/2024)  Transportation Needs: No Transportation Needs (11/20/2023)  Utilities: Not At Risk (02/13/2024)  Alcohol Screen: Medium Risk (02/09/2023)  Depression (PHQ2-9): Medium Risk (02/07/2024)  Financial Resource Strain: Medium Risk (02/13/2024)  Social Connections: Moderately Isolated (11/20/2023)  Tobacco Use: Medium Risk (02/13/2024)    Andriette HILARIO Leech, LCSW Clinical Social Worker [facility] Desk#: [phone] Cell#: [phone]

## 2024-02-22 ENCOUNTER — Encounter (HOSPITAL_COMMUNITY)
Admission: RE | Admit: 2024-02-22 | Discharge: 2024-02-22 | Disposition: A | Discharge status: Home / Self Care | Source: Ambulatory Visit | Attending: Cardiovascular Disease | Admitting: Cardiovascular Disease

## 2024-02-22 DIAGNOSIS — I2111 ST elevation (STEMI) myocardial infarction involving right coronary artery: Secondary | ICD-10-CM

## 2024-02-22 DIAGNOSIS — Z955 Presence of coronary angioplasty implant and graft: Secondary | ICD-10-CM

## 2024-02-22 DIAGNOSIS — Z48812 Encounter for surgical aftercare following surgery on the circulatory system: Secondary | ICD-10-CM | POA: Diagnosis not present

## 2024-02-25 ENCOUNTER — Telehealth (HOSPITAL_COMMUNITY): Payer: Self-pay | Admitting: Licensed Clinical Social Worker

## 2024-02-25 ENCOUNTER — Encounter (HOSPITAL_COMMUNITY)
Admission: RE | Admit: 2024-02-25 | Discharge: 2024-02-25 | Disposition: A | Discharge status: Home / Self Care | Source: Ambulatory Visit | Attending: Cardiovascular Disease

## 2024-02-25 DIAGNOSIS — Z48812 Encounter for surgical aftercare following surgery on the circulatory system: Secondary | ICD-10-CM | POA: Diagnosis not present

## 2024-02-25 DIAGNOSIS — I2111 ST elevation (STEMI) myocardial infarction involving right coronary artery: Secondary | ICD-10-CM

## 2024-02-25 DIAGNOSIS — Z955 Presence of coronary angioplasty implant and graft: Secondary | ICD-10-CM

## 2024-02-25 NOTE — Progress Notes (Signed)
 CARDIAC REHAB PHASE 2  Reviewed home exercise with pt today. Pt is tolerating exercise well. Pt will continue to exercise on his own by walking and going to [facility] for 30-45 minutes per session 2 days a week in addition to the 3 days in [facility]. Advised pt on THRR, RPE scale, hydration and temperature/humidity precautions. Reinforced NTG use, S/S to stop exercise and when to call MD vs 911. Encouraged warm up cool down and stretches with exercise sessions. Pt verbalized understanding, all questions were answered and pt was given a copy to take home.    Alec GORMAN Finder ACSM-CEP 02/25/2024 8:50 AM

## 2024-02-25 NOTE — Telephone Encounter (Signed)
 H&V Care Navigation CSW Progress Note  Clinical Social Worker received all necessary paperwork from pt management company- able to submit check request for assistance with September rent.  Will continue to follow and assist as needed   SDOH Screenings   Food Insecurity: No Food Insecurity (02/13/2024)  Housing: Low Risk  (02/13/2024)  Transportation Needs: No Transportation Needs (11/20/2023)  Utilities: Not At Risk (02/13/2024)  Alcohol Screen: Medium Risk (02/09/2023)  Depression (PHQ2-9): Medium Risk (02/07/2024)  Financial Resource Strain: Medium Risk (02/13/2024)  Social Connections: Moderately Isolated (11/20/2023)  Tobacco Use: Medium Risk (02/13/2024)   Andriette HILARIO Leech, LCSW Clinical Social Worker [facility] Desk#: [phone] Cell#: [phone]

## 2024-02-27 ENCOUNTER — Encounter (HOSPITAL_COMMUNITY)
Admission: RE | Admit: 2024-02-27 | Discharge: 2024-02-27 | Disposition: A | Discharge status: Home / Self Care | Source: Ambulatory Visit | Attending: Cardiovascular Disease

## 2024-02-27 DIAGNOSIS — I2111 ST elevation (STEMI) myocardial infarction involving right coronary artery: Secondary | ICD-10-CM

## 2024-02-27 DIAGNOSIS — Z48812 Encounter for surgical aftercare following surgery on the circulatory system: Secondary | ICD-10-CM | POA: Diagnosis not present

## 2024-02-27 DIAGNOSIS — Z955 Presence of coronary angioplasty implant and graft: Secondary | ICD-10-CM

## 2024-02-29 ENCOUNTER — Other Ambulatory Visit: Payer: Self-pay

## 2024-02-29 ENCOUNTER — Encounter (HOSPITAL_COMMUNITY)
Admission: RE | Admit: 2024-02-29 | Discharge: 2024-02-29 | Disposition: A | Discharge status: Home / Self Care | Source: Ambulatory Visit | Attending: Cardiovascular Disease | Admitting: Cardiovascular Disease

## 2024-02-29 DIAGNOSIS — I2111 ST elevation (STEMI) myocardial infarction involving right coronary artery: Secondary | ICD-10-CM

## 2024-02-29 DIAGNOSIS — Z955 Presence of coronary angioplasty implant and graft: Secondary | ICD-10-CM

## 2024-02-29 DIAGNOSIS — Z48812 Encounter for surgical aftercare following surgery on the circulatory system: Secondary | ICD-10-CM | POA: Diagnosis not present

## 2024-03-03 ENCOUNTER — Other Ambulatory Visit: Payer: Self-pay

## 2024-03-03 ENCOUNTER — Encounter (HOSPITAL_COMMUNITY)
Admission: RE | Admit: 2024-03-03 | Discharge: 2024-03-03 | Disposition: A | Discharge status: Home / Self Care | Source: Ambulatory Visit | Attending: Cardiovascular Disease

## 2024-03-03 DIAGNOSIS — I2111 ST elevation (STEMI) myocardial infarction involving right coronary artery: Secondary | ICD-10-CM

## 2024-03-03 DIAGNOSIS — Z955 Presence of coronary angioplasty implant and graft: Secondary | ICD-10-CM

## 2024-03-03 DIAGNOSIS — Z48812 Encounter for surgical aftercare following surgery on the circulatory system: Secondary | ICD-10-CM | POA: Diagnosis not present

## 2024-03-04 ENCOUNTER — Other Ambulatory Visit: Payer: Self-pay | Admitting: Psychiatry

## 2024-03-04 ENCOUNTER — Other Ambulatory Visit: Payer: Self-pay

## 2024-03-05 ENCOUNTER — Other Ambulatory Visit: Payer: Self-pay

## 2024-03-05 ENCOUNTER — Other Ambulatory Visit (HOSPITAL_COMMUNITY): Payer: Self-pay | Admitting: Physician Assistant

## 2024-03-05 ENCOUNTER — Telehealth (HOSPITAL_COMMUNITY): Payer: Self-pay | Admitting: Licensed Clinical Social Worker

## 2024-03-05 ENCOUNTER — Encounter (HOSPITAL_COMMUNITY)
Admission: RE | Admit: 2024-03-05 | Discharge: 2024-03-05 | Disposition: A | Discharge status: Home / Self Care | Source: Ambulatory Visit | Attending: Cardiovascular Disease

## 2024-03-05 DIAGNOSIS — Z48812 Encounter for surgical aftercare following surgery on the circulatory system: Secondary | ICD-10-CM | POA: Diagnosis not present

## 2024-03-05 DIAGNOSIS — I2111 ST elevation (STEMI) myocardial infarction involving right coronary artery: Secondary | ICD-10-CM

## 2024-03-05 DIAGNOSIS — Z955 Presence of coronary angioplasty implant and graft: Secondary | ICD-10-CM

## 2024-03-05 MED ORDER — POTASSIUM CHLORIDE CRYS ER 20 MEQ PO TBCR
EXTENDED_RELEASE_TABLET | ORAL | 3 refills | Status: DC
  Filled 2024-03-05: qty 30, 30d supply, fill #0
  Filled 2024-04-04: qty 30, 30d supply, fill #1
  Filled 2024-05-05: qty 30, 30d supply, fill #2
  Filled 2024-06-03: qty 30, 30d supply, fill #3

## 2024-03-05 MED ORDER — SPIRONOLACTONE 25 MG PO TABS
25.0000 mg | ORAL_TABLET | Freq: Every day | ORAL | 3 refills | Status: DC
  Filled 2024-03-05: qty 30, 30d supply, fill #0
  Filled 2024-04-04: qty 30, 30d supply, fill #1
  Filled 2024-05-05: qty 30, 30d supply, fill #2
  Filled 2024-06-03: qty 30, 30d supply, fill #3

## 2024-03-05 NOTE — Telephone Encounter (Signed)
 Check for patient assistance with rent received and mailed out- patient and daughter informed  Ricky HILARIO Leech, LCSW Clinical Social Worker [facility] Desk#: [phone] Cell#: [phone]

## 2024-03-07 ENCOUNTER — Encounter (HOSPITAL_COMMUNITY): Payer: Self-pay | Admitting: *Deleted

## 2024-03-07 ENCOUNTER — Telehealth (HOSPITAL_COMMUNITY): Payer: Self-pay | Admitting: *Deleted

## 2024-03-07 ENCOUNTER — Encounter (HOSPITAL_COMMUNITY)
Admission: RE | Admit: 2024-03-07 | Discharge: 2024-03-07 | Disposition: A | Discharge status: Home / Self Care | Source: Ambulatory Visit | Attending: Cardiovascular Disease | Admitting: Cardiovascular Disease

## 2024-03-07 DIAGNOSIS — Z48812 Encounter for surgical aftercare following surgery on the circulatory system: Secondary | ICD-10-CM | POA: Diagnosis not present

## 2024-03-07 DIAGNOSIS — Z955 Presence of coronary angioplasty implant and graft: Secondary | ICD-10-CM

## 2024-03-07 DIAGNOSIS — I2111 ST elevation (STEMI) myocardial infarction involving right coronary artery: Secondary | ICD-10-CM

## 2024-03-07 NOTE — Telephone Encounter (Signed)
 With EF now >40%, RTW letter completed and signed by DR Bensimhon, left a front desk for pt to p/u, he is aware

## 2024-03-12 ENCOUNTER — Other Ambulatory Visit: Payer: Self-pay

## 2024-03-12 ENCOUNTER — Encounter (HOSPITAL_COMMUNITY)
Admission: RE | Admit: 2024-03-12 | Discharge: 2024-03-12 | Disposition: A | Discharge status: Home / Self Care | Source: Ambulatory Visit | Attending: Cardiovascular Disease | Admitting: Cardiovascular Disease

## 2024-03-12 DIAGNOSIS — Z48812 Encounter for surgical aftercare following surgery on the circulatory system: Secondary | ICD-10-CM | POA: Insufficient documentation

## 2024-03-12 DIAGNOSIS — Z955 Presence of coronary angioplasty implant and graft: Secondary | ICD-10-CM | POA: Insufficient documentation

## 2024-03-12 DIAGNOSIS — I2111 ST elevation (STEMI) myocardial infarction involving right coronary artery: Secondary | ICD-10-CM | POA: Diagnosis present

## 2024-03-14 ENCOUNTER — Encounter (HOSPITAL_COMMUNITY)
Admission: RE | Admit: 2024-03-14 | Discharge: 2024-03-14 | Disposition: A | Discharge status: Home / Self Care | Source: Ambulatory Visit | Attending: Cardiovascular Disease | Admitting: Cardiovascular Disease

## 2024-03-14 DIAGNOSIS — Z48812 Encounter for surgical aftercare following surgery on the circulatory system: Secondary | ICD-10-CM | POA: Diagnosis not present

## 2024-03-14 DIAGNOSIS — I2111 ST elevation (STEMI) myocardial infarction involving right coronary artery: Secondary | ICD-10-CM

## 2024-03-14 DIAGNOSIS — Z955 Presence of coronary angioplasty implant and graft: Secondary | ICD-10-CM

## 2024-03-17 ENCOUNTER — Encounter (HOSPITAL_COMMUNITY)
Admission: RE | Admit: 2024-03-17 | Discharge: 2024-03-17 | Disposition: A | Discharge status: Home / Self Care | Source: Ambulatory Visit | Attending: Cardiovascular Disease

## 2024-03-17 DIAGNOSIS — I2111 ST elevation (STEMI) myocardial infarction involving right coronary artery: Secondary | ICD-10-CM

## 2024-03-17 DIAGNOSIS — Z955 Presence of coronary angioplasty implant and graft: Secondary | ICD-10-CM

## 2024-03-17 DIAGNOSIS — Z48812 Encounter for surgical aftercare following surgery on the circulatory system: Secondary | ICD-10-CM | POA: Diagnosis not present

## 2024-03-17 NOTE — Progress Notes (Signed)
 Cardiac Individual Treatment Plan  Patient Details  Name: Ricky Bailey MRN: [id number] Date of Birth: Nov 09, 1955 Referring Provider:   Flowsheet Row INTENSIVE CARDIAC REHAB ORIENT from 02/07/2024 in [facility] for Heart, V[facility], & Lung Health  Referring Provider Dr. 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 Swallow whole., Disp: 30 tablet, Rfl: 12   dapagliflozin  propanediol (FARXIGA ) 10 MG TABS tablet, Take 1 tablet (10 mg total) by mouth daily., Disp: 30 tablet, Rfl: 11   finasteride  (PROSCAR ) 5 MG tablet, Take 1 tablet (5 mg total) by mouth daily., Disp: 30 tablet, Rfl: 0   potassium chloride  SA (KLOR-CON  M) 20 MEQ tablet, Take 1 tablet as needed when taking Torsemide , Disp: 30 tablet, Rfl: 3   prasugrel  (EFFIENT ) 10 MG TABS tablet, Take 1 tablet (10 mg total) by mouth daily., Disp: 30 tablet, Rfl: 3   rosuvastatin  (CRESTOR ) 40 MG tablet, Take 1 tablet (40 mg total) by mouth daily., Disp: 30 tablet, Rfl: 3   sacubitril -valsartan  (ENTRESTO ) 49-51 MG, Take 1 tablet by mouth 2 (two) times daily., Disp: 180 tablet, Rfl: 3   spironolactone  (ALDACTONE ) 25 MG tablet, Take 1 tablet (25 mg total) by mouth daily., Disp: 30 tablet, Rfl: 3   torsemide  (DEMADEX ) 20 MG tablet, Take 1 tablet as needed for leg edema, weight gain, shortness of breath, Disp: 30 tablet, Rfl: 3  Past Medical History: Past  Medical History:  Diagnosis Date   Back pain    BPH (benign prostatic hyperplasia)    Gout    Hyperlipidemia    Hypertension    Obesity    Sleep apnea    Vertigo 03/28/2018   with vommiting    Tobacco Use: Social History   Tobacco Use  Smoking Status Former   Current packs/day: 0.00   Types: Cigarettes   Quit date: 07/21/1977   Years since quitting: 46.6  Smokeless Tobacco Never    Labs: Review Flowsheet  More data exists      Latest Ref Rng & Units 12/01/2023 12/02/2023 12/03/2023 12/04/2023 12/05/2023  Labs for ITP Cardiac and Pulmonary Rehab  O2 Saturation % 87.3  64.2  75.3  45.1  57.6  97.5     Details       Multiple values from one day are sorted in reverse-chronological order          Exercise Target Goals: Exercise Program Goal: Individual exercise prescription set using results from initial 6 min walk test and THRR while considering  patient's activity barriers and safety.   Exercise Prescription Goal: Initial exercise prescription builds to 30-45 minutes a day of aerobic activity, 2-3 days per week.  Home exercise guidelines will be given to patient during program as part of exercise prescription that the participant will acknowledge.   Education: Aerobic Exercise: - Group verbal and visual presentation on the components of exercise prescription. Introduces F.I.T.T principle from ACSM for exercise prescriptions.  Reviews F.I.T.T.  principles of aerobic exercise including progression. Written material provided at class time.   Education: Resistance Exercise: - Group verbal and visual presentation on the components of exercise prescription. Introduces F.I.T.T principle from ACSM for exercise prescriptions  Reviews F.I.T.T. principles of resistance exercise including progression. Written material provided at class time.    Education: Exercise & Equipment Safety: - Individual verbal instruction and demonstration of equipment use and safety with use of the  equipment.   Education: Exercise Physiology & General Exercise Guidelines: - Group verbal and written instruction with models to review the exercise physiology of the cardiovascular system and associated critical values. Provides general exercise guidelines with specific guidelines to those with heart or lung disease. Written material provided at class time.   Education: Flexibility, Balance, Mind/Body Relaxation: - Group verbal and visual presentation with interactive activity on the components of exercise prescription. Introduces F.I.T.T principle from ACSM for exercise prescriptions. Reviews F.I.T.T. principles of flexibility and balance exercise training including progression. Also discusses the mind body connection.  Reviews various relaxation techniques to help reduce and manage stress (i.e. Deep breathing, progressive muscle relaxation, and visualization). Balance handout provided to take home. Written material provided at class time.   Activity Barriers & Risk Stratification:  Activity Barriers & Cardiac Risk Stratification - 02/07/24 1106       Activity Barriers & Cardiac Risk Stratification   Activity Barriers Back Problems;Other (comment)    Comments gout    Cardiac Risk Stratification High          6 Minute Walk:  6 Minute Walk     Row Name 02/07/24 1020         6 Minute Walk   Phase Initial     Distance 1200 feet     Walk Time 6 minutes     # of Rest Breaks 0     MPH 2.27     METS 2.53     RPE 11     Perceived Dyspnea  0     VO2 Peak 8.8     Symptoms Yes (comment)     Comments Multi-focal PVC's, bigeminal runs, patietn remained asymptomatic     Resting HR 71 bpm     Resting BP 112/68     Resting Oxygen Saturation  97 %     Exercise Oxygen Saturation  during 6 min walk 100 %     Max Ex. HR 99 bpm     Max Ex. BP 138/78     2 Minute Post BP 118/78        Oxygen Initial Assessment:   Oxygen Re-Evaluation:   Oxygen Discharge (Final Oxygen  Re-Evaluation):   Initial Exercise Prescription:  Initial Exercise Prescription - 02/07/24 1000       Date of Initial Exercise RX and Referring Provider   Date 02/07/24    Referring Provider Dr. Lonni Cash MD    Expected Discharge Date 04/30/24      Recumbant Bike   Level 1    RPM 50    Watts 15    Minutes 15    METs 1.8      NuStep   Level 1    SPM 75    Minutes 15    METs 1.9      Prescription Details   Frequency (times per week) 3    Duration Progress to 30 minutes of continuous aerobic without signs/symptoms of physical distress      Intensity   THRR 40-80% of Max Heartrate  61-122    Ratings of Perceived Exertion 11-13    Perceived Dyspnea 0-4      Progression   Progression Continue progressive overload as per policy without signs/symptoms or physical distress.      Resistance Training   Training Prescription Yes    Weight 3    Reps 10-15          Perform Capillary Blood Glucose checks as needed.  Exercise Prescription Changes:   Exercise Prescription Changes     Row Name 02/11/24 0828 02/25/24 0840 03/12/24 0830 03/13/24 0806       Response to Exercise   Blood Pressure (Admit) 108/82 120/80 120/86 --    Blood Pressure (Exercise) 124/98 118/76 -- --    Blood Pressure (Exit) 114/86 104/76 120/72 --    Heart Rate (Admit) 92 bpm 76 bpm 78 bpm --    Heart Rate (Exercise) 116 bpm 97 bpm 112 bpm --    Heart Rate (Exit) 92 bpm 80 bpm 87 bpm --    Rating of Perceived Exertion (Exercise) 11 11.5 11 --    Perceived Dyspnea (Exercise) 0 0 0 --    Symptoms None None None --    Comments Pt first day in the [employer] program Reviewed MET's, goals and home ExRx REVD MET's --    Duration Progress to 30 minutes of  aerobic without signs/symptoms of physical distress Progress to 30 minutes of  aerobic without signs/symptoms of physical distress Progress to 30 minutes of  aerobic without signs/symptoms of physical distress --    Intensity THRR  unchanged THRR unchanged THRR unchanged --      Progression   Progression Continue to progress workloads to maintain intensity without signs/symptoms of physical distress. Continue to progress workloads to maintain intensity without signs/symptoms of physical distress. Continue to progress workloads to maintain intensity without signs/symptoms of physical distress. --    Average METs 2 2.25 4.45 --      Resistance Training   Training Prescription Yes Yes No --    Weight 3 3 3  --    Reps 10-15 10-15 10-15 --    Time 10 Minutes 10 Minutes 10 Minutes --      Recumbant Bike   Level 1 3 4  --    RPM 64 56 68 --    Watts 18 15 44 --    Minutes 15 15 15  --    METs 2.2 2 3.8 --      NuStep   Level 1 1 2  --    SPM 72 75 101 --    Minutes 15 15 15  --    METs 1.8 2.5 5.1 --      Home Exercise Plan   Plans to continue exercise at -- [employer] (comment) [employer] (comment) --    Frequency -- Add 2 additional days to program exercise sessions. Add 2 additional days to program exercise sessions. --    Initial Home Exercises Provided -- 02/25/24 02/25/24 --       Exercise Comments:   Exercise Comments     Row Name 02/11/24 [phone] 02/25/24 0849 03/12/24 0829 03/13/24 0828     Exercise Comments Pt first day in the [facility] program. Pt tolerated exercise well with an average MET level of 2.0. Pt is off to a good start and is learning his THRR, RPE and ExRx Reviewed MET's, goals and home ExRx. Pt tolerated exercise well with an average MET level of 2.25. He is doing well  and already feels an increase in stamina. He is already exercising at [facility] and walkign 2 days for 30-45 mins per session. Talked about ways to increase MET's and he tried increasing his depth of step on the Nustep he went from 1.9 to 2.5 MET's. He's doing great Reviewed MET's. Pt tolerated exercise well with an average MET level of 4.45. Pt is doing very well and is increasing WL's and MET's --        Exercise Goals and Review:   Exercise Goals     Row Name 02/07/24 0808             Exercise Goals   Increase Physical Activity Yes       Intervention Provide advice, education, support and counseling about physical activity/exercise needs.;Develop an individualized exercise prescription for aerobic and resistive training based on initial evaluation findings, risk stratification, comorbidities and participant's personal goals.       Expected Outcomes Short Term: Attend rehab on a regular basis to increase amount of physical activity.;Long Term: Exercising regularly at least 3-5 days a week.;Long Term: Add in home exercise to make exercise part of routine and to increase amount of physical activity.       Increase Strength and Stamina Yes       Intervention Provide advice, education, support and counseling about physical activity/exercise needs.;Develop an individualized exercise prescription for aerobic and resistive training based on initial evaluation findings, risk stratification, comorbidities and participant's personal goals.       Expected Outcomes Short Term: Increase workloads from initial exercise prescription for resistance, speed, and METs.;Short Term: Perform resistance training exercises routinely during rehab and add in resistance training at home;Long Term: Improve cardiorespiratory fitness, muscular endurance and strength as measured by increased METs and functional capacity ( )       Able to understand and use rate of perceived exertion (RPE) scale Yes       Intervention Provide education and explanation on how to use RPE scale       Expected Outcomes Short Term: Able to use RPE daily in rehab to express subjective intensity level;Long Term:  Able to use RPE to guide intensity level when exercising independently       Knowledge and understanding of Target Heart Rate Range (THRR) Yes       Intervention Provide education and explanation of THRR including how the numbers  were predicted and where they are located for reference       Expected Outcomes Short Term: Able to state/look up THRR;Long Term: Able to use THRR to govern intensity when exercising independently;Short Term: Able to use daily as guideline for intensity in rehab       Understanding of Exercise Prescription Yes       Intervention Provide education, explanation, and written materials on patient's individual exercise prescription       Expected Outcomes Short Term: Able to explain program exercise prescription;Long Term: Able to explain home exercise prescription to exercise independently          Exercise Goals Re-Evaluation :  Exercise Goals Re-Evaluation     Row Name 02/11/24 0830 02/25/24 0842           Exercise Goal Re-Evaluation   Exercise Goals Review Increase Physical Activity;Understanding of Exercise Prescription;Increase Strength and Stamina;Knowledge and understanding of Target Heart Rate Range (THRR);Able to understand and use rate of perceived exertion (RPE) scale Increase Physical Activity;Understanding of Exercise Prescription;Increase Strength and Stamina;Knowledge and understanding of Target Heart Rate  Range (THRR);Able to understand and use rate of perceived exertion (RPE) scale      Comments Pt first day in the [facility] program. Pt tolerated exercise well with an average MET level of 2.0. Pt is off to a good start and is learning his THRR, RPE and ExRx Reviewed MET's, goals and home ExRx. Pt tolerated exercise well with an average MET level of 2.25. He is doing well and already feels an increase in stamina. He is already exercising at [facility] and walkign 2 days for 30-45 mins per session. Talked about ways to increase MET's and he tried increasing his depth of step on the Nustep he went from 1.9 to 2.5 MET's. He's doing great      Expected Outcomes Will continue to monitor pt and progress workloads as tolerated without sign or symptom Will continue to monitor pt and  progress workloads as tolerated without sign or symptom         Discharge Exercise Prescription (Final Exercise Prescription Changes):  Exercise Prescription Changes - 03/13/24 0806       Response to Exercise   Blood Pressure (Admit) --    Blood Pressure (Exercise) --    Blood Pressure (Exit) --    Heart Rate (Admit) --    Heart Rate (Exercise) --    Heart Rate (Exit) --    Rating of Perceived Exertion (Exercise) --    Perceived Dyspnea (Exercise) --    Symptoms --    Comments --    Duration --    Intensity --      Progression   Progression --    Average METs --      Resistance Training   Training Prescription --    Weight --    Reps --    Time --      Recumbant Bike   Level --    RPM --    Watts --    Minutes --    METs --      NuStep   Level --    SPM --    Minutes --    METs --      Home Exercise Plan   Plans to continue exercise at --    Frequency --    Initial Home Exercises Provided --          Nutrition:  Target Goals: Understanding of nutrition guidelines, daily intake of sodium 1500mg , cholesterol 200mg , calories 30% from fat and 7% or less from saturated fats, daily to have 5 or more servings of fruits and vegetables.  Education: Nutrition 1 -Group instruction provided by verbal, written material, interactive activities, discussions, models, and posters to present general guidelines for heart healthy nutrition including macronutrients, label reading, and promoting whole foods over processed counterparts. Education serves as Pensions consultant of discussion of heart healthy eating for all. Written material provided at class time.    Education: Nutrition 2 -Group instruction provided by verbal, written material, interactive activities, discussions, models, and posters to present general guidelines for heart healthy nutrition including sodium, cholesterol, and saturated fat. Providing guidance of habit forming to improve blood pressure, cholesterol, and  body weight. Written material provided at class time.     Biometrics:  Pre Biometrics - 02/07/24 1025       Pre Biometrics   Height 5' 11 (1.803 m)    Weight 115.6 kg    Waist Circumference 45 inches    Hip Circumference 46 inches    Waist to  Hip Ratio 0.98 %    BMI (Calculated) 35.56    Triceps Skinfold 24 mm    % Body Fat 34.4 %    Grip Strength 36 kg    Flexibility --   Patient unable to reach   Single Leg Stand 16 seconds           Nutrition Therapy Plan and Nutrition Goals:  Nutrition Therapy & Goals - 02/11/24 1026       Nutrition Therapy   Diet Heart Healthy Diet    Drug/Food Interactions Statins/Certain Fruits      Personal Nutrition Goals   Nutrition Goal Patient to identify strategies for reducing cardiovascular risk by attending the [facility] education and nutrition series weekly.    Personal Goal #2 Patient to improve diet quality by using the plate method as a guide for meal planning to include lean protein/plant protein, fruits, vegetables, whole grains, nonfat dairy as part of a well-balanced diet.    Comments Patient has medical history of HTN, CAD, STEMI, hypercholesteremia. He has started making some dietary changes and reduced sodium intake. LDL is not at goal. A1c is in a pre-diabetic range. Patient will benefit from participation in intensive cardiac rehab for nutrition education, exercise, and lifestyle modification.      Intervention Plan   Intervention Prescribe, educate and counsel regarding individualized specific dietary modifications aiming towards targeted core components such as weight, hypertension, lipid management, diabetes, heart failure and other comorbidities.;Nutrition handout(s) given to patient.    Expected Outcomes Short Term Goal: Understand basic principles of dietary content, such as calories, fat, sodium, cholesterol and nutrients.;Long Term Goal: Adherence to prescribed nutrition plan.          Nutrition Assessments:   Nutrition Assessments - 02/25/24 1012       Rate Your Plate Scores   Pre Score 52         MEDIFICTS Score Key: >=70 Need to make dietary changes  40-70 Heart Healthy Diet <= 40 Therapeutic Level Cholesterol Diet  Flowsheet Row INTENSIVE CARDIAC REHAB from 02/25/2024 in [facility] for Heart, Vascular, & Lung Health  Picture Your Plate Total Score on Admission 52   Picture Your Plate Scores: <59 Unhealthy dietary pattern with much room for improvement. 41-50 Dietary pattern unlikely to meet recommendations for good health and room for improvement. 51-60 More healthful dietary pattern, with some room for improvement.  >60 Healthy dietary pattern, although there may be some specific behaviors that could be improved.    Nutrition Goals Re-Evaluation:  Nutrition Goals Re-Evaluation     Row Name 02/11/24 1026             Goals   Current Weight 253 lb 15.5 oz (115.2 kg)       Comment Cr 1.45, GFR 52, LDL 122, HDL 32, A1c 5.8       Expected Outcome Patient has medical history of HTN, CAD, STEMI, hypercholesteremia. He has started making some dietary changes and reduced sodium intake. LDL is not at goal. A1c is in a pre-diabetic range. Patient will benefit from participation in intensive cardiac rehab for nutrition education, exercise, and lifestyle modification.          Nutrition Goals Discharge (Final Nutrition Goals Re-Evaluation):  Nutrition Goals Re-Evaluation - 02/11/24 1026       Goals   Current Weight 253 lb 15.5 oz (115.2 kg)    Comment Cr 1.45, GFR 52, LDL 122, HDL 32, A1c 5.8    Expected Outcome  Patient has medical history of HTN, CAD, STEMI, hypercholesteremia. He has started making some dietary changes and reduced sodium intake. LDL is not at goal. A1c is in a pre-diabetic range. Patient will benefit from participation in intensive cardiac rehab for nutrition education, exercise, and lifestyle modification.           Psychosocial: Target Goals: Acknowledge presence or absence of significant depression and/or stress, maximize coping skills, provide positive support system. Participant is able to verbalize types and ability to use techniques and skills needed for reducing stress and depression.   Education: Stress, Anxiety, and Depression - Group verbal and visual presentation to define topics covered.  Reviews how body is impacted by stress, anxiety, and depression.  Also discusses healthy ways to reduce stress and to treat/manage anxiety and depression. Written material provided at class time.   Education: Sleep Hygiene -Provides group verbal and written instruction about how sleep can affect your health.  Define sleep hygiene, discuss sleep cycles and impact of sleep habits. Review good sleep hygiene tips.   Initial Review & Psychosocial Screening:  Initial Psych Review & Screening - 03/14/24 1301       Initial Review   Source of Stress Concerns Financial;Occupation    Comments Patient is eager to get back to work and has been cleared by Dr Cherrie to RTW      Family Dynamics   Good Support System? Yes   Patient has a good support system with his daughter and sons. No addititonal needs at this time     Barriers   Psychosocial barriers to participate in program The patient should benefit from training in stress management and relaxation.      Screening Interventions   Interventions Encouraged to exercise;To provide support and resources with identified psychosocial needs;Provide feedback about the scores to participant    Expected Outcomes Long Term Goal: Stressors or current issues are controlled or eliminated.;Short Term goal: Identification and review with participant of any Quality of Life or Depression concerns found by scoring the questionnaire.;Long Term goal: The participant improves quality of Life and PHQ9 Scores as seen by post scores and/or verbalization of changes           Quality of Life Scores:   Quality of Life - 02/07/24 1016       Quality of Life   Select Quality of Life      Quality of Life Scores   Health/Function Pre 25.5 %    Socioeconomic Pre 18.81 %    Psych/Spiritual Pre 24.86 %    Family Pre 27.5 %    GLOBAL Pre 24.13 %         Scores of 19 and below usually indicate a poorer quality of life in these areas.  A difference of  2-3 points is a clinically meaningful difference.  A difference of 2-3 points in the total score of the Quality of Life Index has been associated with significant improvement in overall quality of life, self-image, physical symptoms, and general health in studies assessing change in quality of life.  PHQ-9: Review Flowsheet       02/07/2024  Depression screen PHQ 2/9  Decreased Interest 1  Down, Depressed, Hopeless 0  PHQ - 2 Score 1  Altered sleeping 1  Tired, decreased energy 1  Change in appetite 2  Feeling bad or failure about yourself  1  Trouble concentrating 1  Moving slowly or fidgety/restless 0  Suicidal thoughts 0  PHQ-9 Score 7  Difficult doing  work/chores Not difficult at all   Interpretation of Total Score  Total Score Depression Severity:  1-4 = Minimal depression, 5-9 = Mild depression, 10-14 = Moderate depression, 15-19 = Moderately severe depression, 20-27 = Severe depression   Psychosocial Evaluation and Intervention:   Psychosocial Re-Evaluation:  Psychosocial Re-Evaluation     Row Name 02/15/24 1013             Psychosocial Re-Evaluation   Current issues with Current Stress Concerns       Comments Rithwik has not voiced any increased concerns or stressors during exercise at cardiac rehab.       Expected Outcomes Jag will have controled or decreased stressors upon completion of cardiac rehab       Interventions Encouraged to attend Pulmonary Rehabilitation for the exercise;Encouraged to attend Cardiac Rehabilitation for the exercise;Stress management education          Initial Review   Source of Stress Concerns Financial;Occupation       Comments Patient is eager to get back to work          Psychosocial Discharge (Final Psychosocial Re-Evaluation):  Psychosocial Re-Evaluation - 02/15/24 1013       Psychosocial Re-Evaluation   Current issues with Current Stress Concerns    Comments Donatello has not voiced any increased concerns or stressors during exercise at cardiac rehab.    Expected Outcomes Escher will have controled or decreased stressors upon completion of cardiac rehab    Interventions Encouraged to attend Pulmonary Rehabilitation for the exercise;Encouraged to attend Cardiac Rehabilitation for the exercise;Stress management education      Initial Review   Source of Stress Concerns Financial;Occupation    Comments Patient is eager to get back to work          Vocational Rehabilitation: Provide vocational rehab assistance to qualifying candidates.   Vocational Rehab Evaluation & Intervention:  Vocational Rehab - 02/07/24 1027       Initial Vocational Rehab Evaluation & Intervention   Assessment shows need for Vocational Rehabilitation No   He has a job, just waiting on cardiology clearance for return date         Education: Education Goals: Education classes will be provided on a variety of topics geared toward better understanding of heart health and risk factor modification. Participant will state understanding/return demonstration of topics presented as noted by education test scores.  Learning Barriers/Preferences:  Learning Barriers/Preferences - 02/07/24 0902       Learning Barriers/Preferences   Learning Barriers None    Learning Preferences Skilled Demonstration;Individual Instruction;Group Instruction          General Cardiac Education Topics:  AED/CPR: - Group verbal and written instruction with the use of models to demonstrate the basic use of the AED with the basic ABC's of resuscitation.   Test and  Procedures: - Group verbal and visual presentation and models provide information about basic cardiac anatomy and function. Reviews the testing methods done to diagnose heart disease and the outcomes of the test results. Describes the treatment choices: Medical Management, Angioplasty, or Coronary Bypass Surgery for treating various heart conditions including Myocardial Infarction, Angina, Valve Disease, and Cardiac Arrhythmias. Written material provided at class time.   Medication Safety: - Group verbal and visual instruction to review commonly prescribed medications for heart and lung disease. Reviews the medication, class of the drug, and side effects. Includes the steps to properly store meds and maintain the prescription regimen. Written material provided at class time.   Intimacy: -  Group verbal instruction through game format to discuss how heart and lung disease can affect sexual intimacy. Written material provided at class time.   Know Your Numbers and Heart Failure: - Group verbal and visual instruction to discuss disease risk factors for cardiac and pulmonary disease and treatment options.  Reviews associated critical values for Overweight/Obesity, Hypertension, Cholesterol, and Diabetes.  Discusses basics of heart failure: signs/symptoms and treatments.  Introduces Heart Failure Zone chart for action plan for heart failure. Written material provided at class time.   Infection Prevention: - Provides verbal and written material to individual with discussion of infection control including proper hand washing and proper equipment cleaning during exercise session.   Falls Prevention: - Provides verbal and written material to individual with discussion of falls prevention and safety.   Other: -Provides group and verbal instruction on various topics (see comments)   Knowledge Questionnaire Score:  Knowledge Questionnaire Score - 02/07/24 1630       Knowledge Questionnaire Score    Pre Score 21/24          Core Components/Risk Factors/Patient Goals at Admission:  Personal Goals and Risk Factors at Admission - 02/07/24 1028       Core Components/Risk Factors/Patient Goals on Admission    Weight Management Yes;Obesity;Weight Loss    Intervention Weight Management: Develop a combined nutrition and exercise program designed to reach desired caloric intake, while maintaining appropriate intake of nutrient and fiber, sodium and fats, and appropriate energy expenditure required for the weight goal.;Weight Management: Provide education and appropriate resources to help participant work on and attain dietary goals.;Weight Management/Obesity: Establish reasonable short term and long term weight goals.;Obesity: Provide education and appropriate resources to help participant work on and attain dietary goals.    Admit Weight 254 lb 13.6 oz (115.6 kg)    Expected Outcomes Short Term: Continue to assess and modify interventions until short term weight is achieved;Long Term: Adherence to nutrition and physical activity/exercise program aimed toward attainment of established weight goal;Weight Loss: Understanding of general recommendations for a balanced deficit meal plan, which promotes 1-2 lb weight loss per week and includes a negative energy balance of [phone] kcal/d;Understanding recommendations for meals to include 15-35% energy as protein, 25-35% energy from fat, 35-60% energy from carbohydrates, less than 200mg  of dietary cholesterol, 20-35 gm of total fiber daily;Understanding of distribution of calorie intake throughout the day with the consumption of 4-5 meals/snacks    Heart Failure Yes    Intervention Provide a combined exercise and nutrition program that is supplemented with education, support and counseling about heart failure. Directed toward relieving symptoms such as shortness of breath, decreased exercise tolerance, and extremity edema.    Expected Outcomes Improve  functional capacity of life;Short term: Attendance in program 2-3 days a week with increased exercise capacity. Reported lower sodium intake. Reported increased fruit and vegetable intake. Reports medication compliance.;Short term: Daily weights obtained and reported for increase. Utilizing diuretic protocols set by physician.;Long term: Adoption of self-care skills and reduction of barriers for early signs and symptoms recognition and intervention leading to self-care maintenance.    Hypertension Yes    Intervention Provide education on lifestyle modifcations including regular physical activity/exercise, weight management, moderate sodium restriction and increased consumption of fresh fruit, vegetables, and low fat dairy, alcohol moderation, and smoking cessation.;Monitor prescription use compliance.    Expected Outcomes Short Term: Continued assessment and intervention until BP is < 140/70mm HG in hypertensive participants. < 130/56mm HG in hypertensive participants with diabetes, heart failure  or chronic kidney disease.;Long Term: Maintenance of blood pressure at goal levels.    Lipids Yes    Intervention Provide education and support for participant on nutrition & aerobic/resistive exercise along with prescribed medications to achieve LDL 70mg , HDL >40mg .    Expected Outcomes Short Term: Participant states understanding of desired cholesterol values and is compliant with medications prescribed. Participant is following exercise prescription and nutrition guidelines.;Long Term: Cholesterol controlled with medications as prescribed, with individualized exercise RX and with personalized nutrition plan. Value goals: LDL < 70mg , HDL > 40 mg.    Stress Yes    Intervention Offer individual and/or small group education and counseling on adjustment to heart disease, stress management and health-related lifestyle change. Teach and support self-help strategies.;Refer participants experiencing significant  psychosocial distress to appropriate mental health specialists for further evaluation and treatment. When possible, include family members and significant others in education/counseling sessions.    Expected Outcomes Short Term: Participant demonstrates changes in health-related behavior, relaxation and other stress management skills, ability to obtain effective social support, and compliance with psychotropic medications if prescribed.;Long Term: Emotional wellbeing is indicated by absence of clinically significant psychosocial distress or social isolation.    Personal Goal Other Yes    Personal Goal Plan for healthy snacking, Back to work, Independance, endurance    Intervention Will continue to monitor pt and progress workloads as tolerated without sign or symptom    Expected Outcomes Pt will achieve his goals and increase strength          Education:Diabetes - Individual verbal and written instruction to review signs/symptoms of diabetes, desired ranges of glucose level fasting, after meals and with exercise. Acknowledge that pre and post exercise glucose checks will be done for 3 sessions at entry of program.   Core Components/Risk Factors/Patient Goals Review:   Goals and Risk Factor Review     Row Name 02/15/24 1015 03/14/24 1304           Core Components/Risk Factors/Patient Goals Review   Personal Goals Review Weight Management/Obesity;Heart Failure;Hypertension;Lipids;Stress Weight Management/Obesity;Heart Failure;Hypertension;Lipids;Stress      Review Pt is doing well with exercise at cardiac rehab. Vital signs have been stable. Delron has begun to increase his met levels (2 sessions thus far) Pt is doing well with exercise at cardiac rehab. Vital signs have been stable. Rajinder has increased his met levels within the last 30 days      Expected Outcomes Pt will continue to particpate in cardiac rehab for exercise, nutrition and lifestyle modifications Pt will continue to  particpate in cardiac rehab for exercise, nutrition and lifestyle modifications         Core Components/Risk Factors/Patient Goals at Discharge (Final Review):   Goals and Risk Factor Review - 03/14/24 1304       Core Components/Risk Factors/Patient Goals Review   Personal Goals Review Weight Management/Obesity;Heart Failure;Hypertension;Lipids;Stress    Review Pt is doing well with exercise at cardiac rehab. Vital signs have been stable. Jacobus has increased his met levels within the last 30 days    Expected Outcomes Pt will continue to particpate in cardiac rehab for exercise, nutrition and lifestyle modifications          ITP Comments:  ITP Comments     Row Name 02/07/24 0806 02/15/24 1011 03/14/24 1259       ITP Comments Wilbert Bihari, MD: Medical Director.  Introduction to the [facility] / Intensive [facility]iac [facility].  Initial orientation packet reviewed with the patient. 30 Day  ITP Review. Sanjuan has good participation with exercise at cardiac rehab when in attendance 30 Day ITP Review. Daisuke has good participation with exercise at cardiac rehab when in attendance        Comments: see ITP comments

## 2024-03-18 ENCOUNTER — Other Ambulatory Visit: Payer: Self-pay

## 2024-03-19 ENCOUNTER — Encounter (HOSPITAL_COMMUNITY)
Admission: RE | Admit: 2024-03-19 | Discharge: 2024-03-19 | Disposition: A | Discharge status: Home / Self Care | Source: Ambulatory Visit | Attending: Cardiovascular Disease | Admitting: Cardiovascular Disease

## 2024-03-19 DIAGNOSIS — Z48812 Encounter for surgical aftercare following surgery on the circulatory system: Secondary | ICD-10-CM | POA: Diagnosis not present

## 2024-03-19 DIAGNOSIS — Z955 Presence of coronary angioplasty implant and graft: Secondary | ICD-10-CM

## 2024-03-19 DIAGNOSIS — I2111 ST elevation (STEMI) myocardial infarction involving right coronary artery: Secondary | ICD-10-CM

## 2024-03-21 ENCOUNTER — Encounter (HOSPITAL_COMMUNITY): Admission: RE | Admit: 2024-03-21 | Source: Ambulatory Visit

## 2024-03-24 ENCOUNTER — Encounter (HOSPITAL_COMMUNITY)
Admission: RE | Admit: 2024-03-24 | Discharge: 2024-03-24 | Disposition: A | Discharge status: Home / Self Care | Source: Ambulatory Visit | Attending: Cardiovascular Disease | Admitting: Cardiovascular Disease

## 2024-03-24 DIAGNOSIS — I2111 ST elevation (STEMI) myocardial infarction involving right coronary artery: Secondary | ICD-10-CM

## 2024-03-24 DIAGNOSIS — Z48812 Encounter for surgical aftercare following surgery on the circulatory system: Secondary | ICD-10-CM | POA: Diagnosis not present

## 2024-03-24 DIAGNOSIS — Z955 Presence of coronary angioplasty implant and graft: Secondary | ICD-10-CM

## 2024-03-26 ENCOUNTER — Encounter (HOSPITAL_COMMUNITY): Admission: RE | Admit: 2024-03-26 | Source: Ambulatory Visit

## 2024-03-28 ENCOUNTER — Encounter (HOSPITAL_COMMUNITY): Admission: RE | Admit: 2024-03-28 | Source: Ambulatory Visit

## 2024-03-31 ENCOUNTER — Encounter (HOSPITAL_COMMUNITY)
Admission: RE | Admit: 2024-03-31 | Discharge: 2024-03-31 | Disposition: A | Discharge status: Home / Self Care | Source: Ambulatory Visit | Attending: Cardiovascular Disease | Admitting: Cardiovascular Disease

## 2024-03-31 ENCOUNTER — Other Ambulatory Visit: Payer: Self-pay

## 2024-03-31 ENCOUNTER — Other Ambulatory Visit (HOSPITAL_COMMUNITY): Payer: Self-pay | Admitting: Physician Assistant

## 2024-03-31 DIAGNOSIS — Z48812 Encounter for surgical aftercare following surgery on the circulatory system: Secondary | ICD-10-CM | POA: Diagnosis not present

## 2024-03-31 DIAGNOSIS — I2111 ST elevation (STEMI) myocardial infarction involving right coronary artery: Secondary | ICD-10-CM

## 2024-03-31 DIAGNOSIS — Z955 Presence of coronary angioplasty implant and graft: Secondary | ICD-10-CM

## 2024-04-02 ENCOUNTER — Encounter (HOSPITAL_COMMUNITY): Admission: RE | Admit: 2024-04-02 | Source: Ambulatory Visit

## 2024-04-02 ENCOUNTER — Other Ambulatory Visit: Payer: Self-pay

## 2024-04-02 MED ORDER — TORSEMIDE 20 MG PO TABS
20.0000 mg | ORAL_TABLET | Freq: Every day | ORAL | 3 refills | Status: AC | PRN
  Filled 2024-04-02: qty 30, 30d supply, fill #0

## 2024-04-02 MED ORDER — PRASUGREL HCL 10 MG PO TABS
10.0000 mg | ORAL_TABLET | Freq: Every day | ORAL | 3 refills | Status: DC
  Filled 2024-04-02: qty 30, 30d supply, fill #0
  Filled 2024-04-29: qty 30, 30d supply, fill #1
  Filled 2024-05-29: qty 30, 30d supply, fill #2
  Filled 2024-06-30: qty 30, 30d supply, fill #3

## 2024-04-02 MED ORDER — ROSUVASTATIN CALCIUM 40 MG PO TABS
40.0000 mg | ORAL_TABLET | Freq: Every day | ORAL | 3 refills | Status: DC
  Filled 2024-04-02: qty 30, 30d supply, fill #0
  Filled 2024-04-29: qty 30, 30d supply, fill #1
  Filled 2024-05-29: qty 30, 30d supply, fill #2
  Filled 2024-06-30: qty 30, 30d supply, fill #3

## 2024-04-04 ENCOUNTER — Encounter (HOSPITAL_COMMUNITY)
Admission: RE | Admit: 2024-04-04 | Discharge: 2024-04-04 | Disposition: A | Discharge status: Home / Self Care | Source: Ambulatory Visit | Attending: Cardiovascular Disease | Admitting: Cardiovascular Disease

## 2024-04-04 ENCOUNTER — Other Ambulatory Visit: Payer: Self-pay

## 2024-04-04 DIAGNOSIS — Z955 Presence of coronary angioplasty implant and graft: Secondary | ICD-10-CM

## 2024-04-04 DIAGNOSIS — I2111 ST elevation (STEMI) myocardial infarction involving right coronary artery: Secondary | ICD-10-CM

## 2024-04-04 DIAGNOSIS — Z48812 Encounter for surgical aftercare following surgery on the circulatory system: Secondary | ICD-10-CM | POA: Diagnosis not present

## 2024-04-07 ENCOUNTER — Encounter (HOSPITAL_COMMUNITY)
Admission: RE | Admit: 2024-04-07 | Discharge: 2024-04-07 | Disposition: A | Discharge status: Home / Self Care | Source: Ambulatory Visit | Attending: Cardiovascular Disease | Admitting: Cardiovascular Disease

## 2024-04-07 DIAGNOSIS — Z955 Presence of coronary angioplasty implant and graft: Secondary | ICD-10-CM

## 2024-04-07 DIAGNOSIS — Z48812 Encounter for surgical aftercare following surgery on the circulatory system: Secondary | ICD-10-CM | POA: Diagnosis not present

## 2024-04-07 DIAGNOSIS — I2111 ST elevation (STEMI) myocardial infarction involving right coronary artery: Secondary | ICD-10-CM

## 2024-04-08 ENCOUNTER — Other Ambulatory Visit: Payer: Self-pay

## 2024-04-09 ENCOUNTER — Encounter (HOSPITAL_COMMUNITY)
Admission: RE | Admit: 2024-04-09 | Discharge: 2024-04-09 | Disposition: A | Discharge status: Home / Self Care | Source: Ambulatory Visit | Attending: Cardiovascular Disease | Admitting: Cardiovascular Disease

## 2024-04-09 DIAGNOSIS — I2111 ST elevation (STEMI) myocardial infarction involving right coronary artery: Secondary | ICD-10-CM | POA: Insufficient documentation

## 2024-04-09 DIAGNOSIS — Z955 Presence of coronary angioplasty implant and graft: Secondary | ICD-10-CM | POA: Insufficient documentation

## 2024-04-11 ENCOUNTER — Encounter (HOSPITAL_COMMUNITY)
Admission: RE | Admit: 2024-04-11 | Discharge: 2024-04-11 | Disposition: A | Discharge status: Home / Self Care | Source: Ambulatory Visit | Attending: Cardiovascular Disease | Admitting: Cardiovascular Disease

## 2024-04-11 DIAGNOSIS — Z955 Presence of coronary angioplasty implant and graft: Secondary | ICD-10-CM

## 2024-04-11 DIAGNOSIS — I2111 ST elevation (STEMI) myocardial infarction involving right coronary artery: Secondary | ICD-10-CM | POA: Diagnosis not present

## 2024-04-14 ENCOUNTER — Encounter (HOSPITAL_COMMUNITY)
Admission: RE | Admit: 2024-04-14 | Discharge: 2024-04-14 | Disposition: A | Discharge status: Home / Self Care | Source: Ambulatory Visit | Attending: Cardiovascular Disease | Admitting: Cardiovascular Disease

## 2024-04-14 DIAGNOSIS — I2111 ST elevation (STEMI) myocardial infarction involving right coronary artery: Secondary | ICD-10-CM | POA: Diagnosis not present

## 2024-04-14 DIAGNOSIS — Z955 Presence of coronary angioplasty implant and graft: Secondary | ICD-10-CM

## 2024-04-15 NOTE — Progress Notes (Signed)
 Cardiac Individual Treatment Plan  Patient Details  Name: Ricky Bailey MRN: [id number] Date of Birth: 10-24-1955 Referring Provider:   Flowsheet Row INTENSIVE CARDIAC REHAB ORIENT from 02/07/2024 in [facility]  Referring Provider Dr. Lonni Cash MD    Initial Encounter Date:  Flowsheet Row INTENSIVE CARDIAC REHAB ORIENT from 02/07/2024 in [facility] for Heart, Vascular, & Lung [facility]  Date 02/07/24    Visit Diagnosis: 11/20/23 ST elevation myocardial infarction involving right coronary artery (HCC)  11/20/23 Status post coronary artery stent placement RCA  Patient's Home Medications on Admission:  Current Outpatient Medications:    allopurinol  (ZYLOPRIM ) 300 MG tablet, Take 0.5 tablets (150 mg total) by mouth daily., Disp: 30 tablet, Rfl: 0   aspirin  EC 81 MG tablet, Take 1 tablet (81 mg total) by mouth daily. Swallow whole., Disp: 30 tablet, Rfl: 12   dapagliflozin  propanediol (FARXIGA ) 10 MG TABS tablet, Take 1 tablet (10 mg total) by mouth daily., Disp: 30 tablet, Rfl: 11   finasteride  (PROSCAR ) 5 MG tablet, Take 1 tablet (5 mg total) by mouth daily., Disp: 30 tablet, Rfl: 0   potassium chloride  SA (KLOR-CON  M) 20 MEQ tablet, Take 1 tablet as needed when taking Torsemide , Disp: 30 tablet, Rfl: 3   prasugrel  (EFFIENT ) 10 MG TABS tablet, Take 1 tablet (10 mg total) by mouth daily., Disp: 30 tablet, Rfl: 3   rosuvastatin  (CRESTOR ) 40 MG tablet, Take 1 tablet (40 mg total) by mouth daily., Disp: 30 tablet, Rfl: 3   sacubitril -valsartan  (ENTRESTO ) 49-51 MG, Take 1 tablet by mouth 2 (two) times daily., Disp: 180 tablet, Rfl: 3   spironolactone  (ALDACTONE ) 25 MG tablet, Take 1 tablet (25 mg total) by mouth daily., Disp: 30 tablet, Rfl: 3   torsemide  (DEMADEX ) 20 MG tablet, Take 1 tablet (20 mg total) by mouth daily as needed for leg edema, weight gain, shortness of breath, Disp: 30 tablet, Rfl:  3  Past Medical History: Past Medical History:  Diagnosis Date   Back pain    BPH (benign prostatic hyperplasia)    Gout    Hyperlipidemia    Hypertension    Obesity    Sleep apnea    Vertigo 03/28/2018   with vommiting    Tobacco Use: Social History   Tobacco Use  Smoking Status Former   Current packs/day: 0.00   Types: Cigarettes   Quit date: 07/21/1977   Years since quitting: 46.7  Smokeless Tobacco Never    Labs: Review Flowsheet  More data exists      Latest Ref Rng & Units 12/01/2023 12/02/2023 12/03/2023 12/04/2023 12/05/2023  Labs for ITP Cardiac and Pulmonary Rehab  O2 Saturation % 87.3  64.2  75.3  45.1  57.6  97.5     Details       Multiple values from one day are sorted in reverse-chronological order          Exercise Target Goals: Exercise Program Goal: Individual exercise prescription set using results from initial 6 min walk test and THRR while considering  patient's activity barriers and safety.   Exercise Prescription Goal: Initial exercise prescription builds to 30-45 minutes a day of aerobic activity, 2-3 days per week.  Home exercise guidelines will be given to patient during program as part of exercise prescription that the participant will acknowledge.   Education: Aerobic Exercise: - Group verbal and visual presentation on the components of exercise prescription. Introduces F.I.T.T principle from ACSM  for exercise prescriptions.  Reviews F.I.T.T. principles of aerobic exercise including progression. Written material provided at class time.   Education: Resistance Exercise: - Group verbal and visual presentation on the components of exercise prescription. Introduces F.I.T.T principle from ACSM for exercise prescriptions  Reviews F.I.T.T. principles of resistance exercise including progression. Written material provided at class time.    Education: Exercise & Equipment Safety: - Individual verbal instruction and demonstration of equipment  use and safety with use of the equipment.   Education: Exercise Physiology & General Exercise Guidelines: - Group verbal and written instruction with models to review the exercise physiology of the cardiovascular system and associated critical values. Provides general exercise guidelines with specific guidelines to those with heart or lung disease. Written material provided at class time.   Education: Flexibility, Balance, Mind/Body Relaxation: - Group verbal and visual presentation with interactive activity on the components of exercise prescription. Introduces F.I.T.T principle from ACSM for exercise prescriptions. Reviews F.I.T.T. principles of flexibility and balance exercise training including progression. Also discusses the mind body connection.  Reviews various relaxation techniques to help reduce and manage stress (i.e. Deep breathing, progressive muscle relaxation, and visualization). Balance handout provided to take home. Written material provided at class time.   Activity Barriers & Risk Stratification:  Activity Barriers & Cardiac Risk Stratification - 02/07/24 1106       Activity Barriers & Cardiac Risk Stratification   Activity Barriers Back Problems;Other (comment)    Comments gout    Cardiac Risk Stratification High          6 Minute Walk:  6 Minute Walk     Row Name 02/07/24 1020         6 Minute Walk   Phase Initial     Distance 1200 feet     Walk Time 6 minutes     # of Rest Breaks 0     MPH 2.27     METS 2.53     RPE 11     Perceived Dyspnea  0     VO2 Peak 8.8     Symptoms Yes (comment)     Comments Multi-focal PVC's, bigeminal runs, patietn remained asymptomatic     Resting HR 71 bpm     Resting BP 112/68     Resting Oxygen Saturation  97 %     Exercise Oxygen Saturation  during 6 min walk 100 %     Max Ex. HR 99 bpm     Max Ex. BP 138/78     2 Minute Post BP 118/78        Oxygen Initial Assessment:   Oxygen Re-Evaluation:   Oxygen  Discharge (Final Oxygen Re-Evaluation):   Initial Exercise Prescription:  Initial Exercise Prescription - 02/07/24 1000       Date of Initial Exercise RX and Referring Provider   Date 02/07/24    Referring Provider Dr. Lonni Cash MD    Expected Discharge Date 04/30/24      Recumbant Bike   Level 1    RPM 50    Watts 15    Minutes 15    METs 1.8      NuStep   Level 1    SPM 75    Minutes 15    METs 1.9      Prescription Details   Frequency (times per week) 3    Duration Progress to 30 minutes of continuous aerobic without signs/symptoms of physical distress      Intensity  THRR 40-80% of Max Heartrate 61-122    Ratings of Perceived Exertion 11-13    Perceived Dyspnea 0-4      Progression   Progression Continue progressive overload as per policy without signs/symptoms or physical distress.      Resistance Training   Training Prescription Yes    Weight 3    Reps 10-15          Perform Capillary Blood Glucose checks as needed.  Exercise Prescription Changes:   Exercise Prescription Changes     Row Name 02/11/24 0828 02/25/24 0840 03/12/24 0830 03/13/24 0806 03/24/24 0839     Response to Exercise   Blood Pressure (Admit) 108/82 120/80 120/86 -- 120/86   Blood Pressure (Exercise) 124/98 118/76 -- -- --   Blood Pressure (Exit) 114/86 104/76 120/72 -- 120/72   Heart Rate (Admit) 92 bpm 76 bpm 78 bpm -- 78 bpm   Heart Rate (Exercise) 116 bpm 97 bpm 112 bpm -- 112 bpm   Heart Rate (Exit) 92 bpm 80 bpm 87 bpm -- 87 bpm   Rating of Perceived Exertion (Exercise) 11 11.5 11 -- 11   Perceived Dyspnea (Exercise) 0 0 0 -- 0   Symptoms None None None -- None   Comments Pt first day in the [employer] program Reviewed MET's, goals and home ExRx REVD MET's -- REVD MET's and goals   Duration Progress to 30 minutes of  aerobic without signs/symptoms of physical distress Progress to 30 minutes of  aerobic without signs/symptoms of physical distress Progress to 30  minutes of  aerobic without signs/symptoms of physical distress -- Progress to 30 minutes of  aerobic without signs/symptoms of physical distress   Intensity THRR unchanged THRR unchanged THRR unchanged -- THRR unchanged     Progression   Progression Continue to progress workloads to maintain intensity without signs/symptoms of physical distress. Continue to progress workloads to maintain intensity without signs/symptoms of physical distress. Continue to progress workloads to maintain intensity without signs/symptoms of physical distress. -- Continue to progress workloads to maintain intensity without signs/symptoms of physical distress.   Average METs 2 2.25 4.45 -- 2.35     Resistance Training   Training Prescription Yes Yes No -- Yes   Weight 3 3 3  -- 3   Reps 10-15 10-15 10-15 -- 10-15   Time 10 Minutes 10 Minutes 10 Minutes -- 10 Minutes     Recumbant Bike   Level 1 3 4  -- 4   RPM 64 56 68 -- 71   Watts 18 15 44 -- 44   Minutes 15 15 15  -- 15   METs 2.2 2 3.8 -- 2.7     NuStep   Level 1 1 2  -- 4   SPM 72 75 101 -- 111   Minutes 15 15 15  -- 15   METs 1.8 2.5 5.1 -- 2     Home Exercise Plan   Plans to continue exercise at -- [employer] (comment) [employer] (comment) -- [employer] (comment)   Frequency -- Add 2 additional days to program exercise sessions. Add 2 additional days to program exercise sessions. -- Add 2 additional days to program exercise sessions.   Initial Home Exercises Provided -- 02/25/24 02/25/24 -- 02/25/24    Row Name 04/07/24 0901             Response to Exercise   Blood Pressure (Admit) 122/84       Blood Pressure (Exit) 96/78  Heart Rate (Admit) 68 bpm       Heart Rate (Exercise) 112 bpm       Heart Rate (Exit) 73 bpm       Rating of Perceived Exertion (Exercise) 11       Perceived Dyspnea (Exercise) 0       Symptoms None       Comments REVD MET's       Duration Progress to 30 minutes of  aerobic without  signs/symptoms of physical distress       Intensity THRR unchanged         Progression   Progression Continue to progress workloads to maintain intensity without signs/symptoms of physical distress.       Average METs 2.8         Resistance Training   Training Prescription Yes       Weight 3       Reps 10-15       Time 10 Minutes         Recumbant Bike   Level 4       RPM 67       Watts 51       Minutes 15       METs 3.2         Recumbant Elliptical   Level 1       RPM 51       Watts 67       Minutes 15       METs 2.4         Home Exercise Plan   Plans to continue exercise at [employer] (comment)       Frequency Add 2 additional days to program exercise sessions.       Initial Home Exercises Provided 02/25/24          Exercise Comments:   Exercise Comments     Row Name 02/11/24 [phone] 02/25/24 0849 03/12/24 0829 03/13/24 0828 03/24/24 0843   Exercise Comments Pt first day in the [facility] program. Pt tolerated exercise well with an average MET level of 2.0. Pt is off to a good start and is learning his THRR, RPE and ExRx Reviewed MET's, goals and home ExRx. Pt tolerated exercise well with an average MET level of 2.25. He is doing well and already feels an increase in stamina. He is already exercising at [facility] and walkign 2 days for 30-45 mins per session. Talked about ways to increase MET's and he tried increasing his depth of step on the Nustep he went from 1.9 to 2.5 MET's. He's doing great Reviewed MET's. Pt tolerated exercise well with an average MET level of 4.45. Pt is doing very well and is increasing WL's and MET's -- Reviewed MET's, and goals. Pt tolerated exercise well with an average MET level of 2.35. He is feeling good with exercise and is making gradual increases. Will try octane on next visit to boost endurance more. He is also making better food choices and feels good about his goals    Row Name 04/07/24 [phone]           Exercise  Comments Reviewed MET's, and goals. Pt tolerated exercise well with an average MET level of 2.8. He recently changed to the Octane which has been a good boost and has helped increase MET's. He is doing well and enjoys his exercise routine.          Exercise Goals and Review:   Exercise Goals  Row Name 02/07/24 0808             Exercise Goals   Increase Physical Activity Yes       Intervention Provide advice, education, support and counseling about physical activity/exercise needs.;Develop an individualized exercise prescription for aerobic and resistive training based on initial evaluation findings, risk stratification, comorbidities and participant's personal goals.       Expected Outcomes Short Term: Attend rehab on a regular basis to increase amount of physical activity.;Long Term: Exercising regularly at least 3-5 days a week.;Long Term: Add in home exercise to make exercise part of routine and to increase amount of physical activity.       Increase Strength and Stamina Yes       Intervention Provide advice, education, support and counseling about physical activity/exercise needs.;Develop an individualized exercise prescription for aerobic and resistive training based on initial evaluation findings, risk stratification, comorbidities and participant's personal goals.       Expected Outcomes Short Term: Increase workloads from initial exercise prescription for resistance, speed, and METs.;Short Term: Perform resistance training exercises routinely during rehab and add in resistance training at home;Long Term: Improve cardiorespiratory fitness, muscular endurance and strength as measured by increased METs and functional capacity ( )       Able to understand and use rate of perceived exertion (RPE) scale Yes       Intervention Provide education and explanation on how to use RPE scale       Expected Outcomes Short Term: Able to use RPE daily in rehab to express subjective intensity  level;Long Term:  Able to use RPE to guide intensity level when exercising independently       Knowledge and understanding of Target Heart Rate Range (THRR) Yes       Intervention Provide education and explanation of THRR including how the numbers were predicted and where they are located for reference       Expected Outcomes Short Term: Able to state/look up THRR;Long Term: Able to use THRR to govern intensity when exercising independently;Short Term: Able to use daily as guideline for intensity in rehab       Understanding of Exercise Prescription Yes       Intervention Provide education, explanation, and written materials on patient's individual exercise prescription       Expected Outcomes Short Term: Able to explain program exercise prescription;Long Term: Able to explain home exercise prescription to exercise independently          Exercise Goals Re-Evaluation :  Exercise Goals Re-Evaluation     Row Name 02/11/24 0830 02/25/24 0842 03/24/24 0841         Exercise Goal Re-Evaluation   Exercise Goals Review Increase Physical Activity;Understanding of Exercise Prescription;Increase Strength and Stamina;Knowledge and understanding of Target Heart Rate Range (THRR);Able to understand and use rate of perceived exertion (RPE) scale Increase Physical Activity;Understanding of Exercise Prescription;Increase Strength and Stamina;Knowledge and understanding of Target Heart Rate Range (THRR);Able to understand and use rate of perceived exertion (RPE) scale Increase Physical Activity;Understanding of Exercise Prescription;Increase Strength and Stamina;Knowledge and understanding of Target Heart Rate Range (THRR);Able to understand and use rate of perceived exertion (RPE) scale     Comments Pt first day in the [facility] program. Pt tolerated exercise well with an average MET level of 2.0. Pt is off to a good start and is learning his THRR, RPE and ExRx Reviewed MET's, goals and home ExRx. Pt tolerated  exercise well with an average MET level of  2.25. He is doing well and already feels an increase in stamina. He is already exercising at [facility] and walkign 2 days for 30-45 mins per session. Talked about ways to increase MET's and he tried increasing his depth of step on the Nustep he went from 1.9 to 2.5 MET's. He's doing great Reviewed MET's, and goals. Pt tolerated exercise well with an average MET level of 2.35. He is feeling good with exercise and is making gradual increases. Will try octane on next visit to boost endurance more. He is also making better food choices and feels good about his goals     Expected Outcomes Will continue to monitor pt and progress workloads as tolerated without sign or symptom Will continue to monitor pt and progress workloads as tolerated without sign or symptom Will continue to monitor pt and progress workloads as tolerated without sign or symptom        Discharge Exercise Prescription (Final Exercise Prescription Changes):  Exercise Prescription Changes - 04/07/24 0901       Response to Exercise   Blood Pressure (Admit) 122/84    Blood Pressure (Exit) 96/78    Heart Rate (Admit) 68 bpm    Heart Rate (Exercise) 112 bpm    Heart Rate (Exit) 73 bpm    Rating of Perceived Exertion (Exercise) 11    Perceived Dyspnea (Exercise) 0    Symptoms None    Comments REVD MET's    Duration Progress to 30 minutes of  aerobic without signs/symptoms of physical distress    Intensity THRR unchanged      Progression   Progression Continue to progress workloads to maintain intensity without signs/symptoms of physical distress.    Average METs 2.8      Resistance Training   Training Prescription Yes    Weight 3    Reps 10-15    Time 10 Minutes      Recumbant Bike   Level 4    RPM 67    Watts 51    Minutes 15    METs 3.2      Recumbant Elliptical   Level 1    RPM 51    Watts 67    Minutes 15    METs 2.4      Home Exercise Plan   Plans to continue  exercise at [employer] (comment)    Frequency Add 2 additional days to program exercise sessions.    Initial Home Exercises Provided 02/25/24          Nutrition:  Target Goals: Understanding of nutrition guidelines, daily intake of sodium 1500mg , cholesterol 200mg , calories 30% from fat and 7% or less from saturated fats, daily to have 5 or more servings of fruits and vegetables.  Education: Nutrition 1 -Group instruction provided by verbal, written material, interactive activities, discussions, models, and posters to present general guidelines for heart healthy nutrition including macronutrients, label reading, and promoting whole foods over processed counterparts. Education serves as Pensions consultant of discussion of heart healthy eating for all. Written material provided at class time.    Education: Nutrition 2 -Group instruction provided by verbal, written material, interactive activities, discussions, models, and posters to present general guidelines for heart healthy nutrition including sodium, cholesterol, and saturated fat. Providing guidance of habit forming to improve blood pressure, cholesterol, and body weight. Written material provided at class time.     Biometrics:  Pre Biometrics - 02/07/24 1025       Pre Biometrics   Height 5'  11 (1.803 m)    Weight 115.6 kg    Waist Circumference 45 inches    Hip Circumference 46 inches    Waist to Hip Ratio 0.98 %    BMI (Calculated) 35.56    Triceps Skinfold 24 mm    % Body Fat 34.4 %    Grip Strength 36 kg    Flexibility --   Patient unable to reach   Single Leg Stand 16 seconds           Nutrition Therapy Plan and Nutrition Goals:  Nutrition Therapy & Goals - 02/11/24 1026       Nutrition Therapy   Diet Heart Healthy Diet    Drug/Food Interactions Statins/Certain Fruits      Personal Nutrition Goals   Nutrition Goal Patient to identify strategies for reducing cardiovascular risk by attending the [facility]  education and nutrition series weekly.    Personal Goal #2 Patient to improve diet quality by using the plate method as a guide for meal planning to include lean protein/plant protein, fruits, vegetables, whole grains, nonfat dairy as part of a well-balanced diet.    Comments Patient has medical history of HTN, CAD, STEMI, hypercholesteremia. He has started making some dietary changes and reduced sodium intake. LDL is not at goal. A1c is in a pre-diabetic range. Patient will benefit from participation in intensive cardiac rehab for nutrition education, exercise, and lifestyle modification.      Intervention Plan   Intervention Prescribe, educate and counsel regarding individualized specific dietary modifications aiming towards targeted core components such as weight, hypertension, lipid management, diabetes, heart failure and other comorbidities.;Nutrition handout(s) given to patient.    Expected Outcomes Short Term Goal: Understand basic principles of dietary content, such as calories, fat, sodium, cholesterol and nutrients.;Long Term Goal: Adherence to prescribed nutrition plan.          Nutrition Assessments:  Nutrition Assessments - 02/25/24 1012       Rate Your Plate Scores   Pre Score 52         MEDIFICTS Score Key: >=70 Need to make dietary changes  40-70 Heart Healthy Diet <= 40 Therapeutic Level Cholesterol Diet  Flowsheet Row INTENSIVE CARDIAC REHAB from 02/25/2024 in [facility] for Heart, Vascular, & Lung Health  Picture Your Plate Total Score on Admission 52   Picture Your Plate Scores: <59 Unhealthy dietary pattern with much room for improvement. 41-50 Dietary pattern unlikely to meet recommendations for good health and room for improvement. 51-60 More healthful dietary pattern, with some room for improvement.  >60 Healthy dietary pattern, although there may be some specific behaviors that could be improved.    Nutrition Goals Re-Evaluation:   Nutrition Goals Re-Evaluation     Row Name 02/11/24 1026             Goals   Current Weight 253 lb 15.5 oz (115.2 kg)       Comment Cr 1.45, GFR 52, LDL 122, HDL 32, A1c 5.8       Expected Outcome Patient has medical history of HTN, CAD, STEMI, hypercholesteremia. He has started making some dietary changes and reduced sodium intake. LDL is not at goal. A1c is in a pre-diabetic range. Patient will benefit from participation in intensive cardiac rehab for nutrition education, exercise, and lifestyle modification.          Nutrition Goals Discharge (Final Nutrition Goals Re-Evaluation):  Nutrition Goals Re-Evaluation - 02/11/24 1026       Goals  Current Weight 253 lb 15.5 oz (115.2 kg)    Comment Cr 1.45, GFR 52, LDL 122, HDL 32, A1c 5.8    Expected Outcome Patient has medical history of HTN, CAD, STEMI, hypercholesteremia. He has started making some dietary changes and reduced sodium intake. LDL is not at goal. A1c is in a pre-diabetic range. Patient will benefit from participation in intensive cardiac rehab for nutrition education, exercise, and lifestyle modification.          Psychosocial: Target Goals: Acknowledge presence or absence of significant depression and/or stress, maximize coping skills, provide positive support system. Participant is able to verbalize types and ability to use techniques and skills needed for reducing stress and depression.   Education: Stress, Anxiety, and Depression - Group verbal and visual presentation to define topics covered.  Reviews how body is impacted by stress, anxiety, and depression.  Also discusses healthy ways to reduce stress and to treat/manage anxiety and depression. Written material provided at class time.   Education: Sleep Hygiene -Provides group verbal and written instruction about how sleep can affect your health.  Define sleep hygiene, discuss sleep cycles and impact of sleep habits. Review good sleep hygiene tips.   Initial  Review & Psychosocial Screening:  Initial Psych Review & Screening - 03/14/24 1301       Initial Review   Source of Stress Concerns Financial;Occupation    Comments Patient is eager to get back to work and has been cleared by Dr Cherrie to RTW      Family Dynamics   Good Support System? Yes   Patient has a good support system with his daughter and sons. No addititonal needs at this time     Barriers   Psychosocial barriers to participate in program The patient should benefit from training in stress management and relaxation.      Screening Interventions   Interventions Encouraged to exercise;To provide support and resources with identified psychosocial needs;Provide feedback about the scores to participant    Expected Outcomes Long Term Goal: Stressors or current issues are controlled or eliminated.;Short Term goal: Identification and review with participant of any Quality of Life or Depression concerns found by scoring the questionnaire.;Long Term goal: The participant improves quality of Life and PHQ9 Scores as seen by post scores and/or verbalization of changes          Quality of Life Scores:   Quality of Life - 02/07/24 1016       Quality of Life   Select Quality of Life      Quality of Life Scores   Health/Function Pre 25.5 %    Socioeconomic Pre 18.81 %    Psych/Spiritual Pre 24.86 %    Family Pre 27.5 %    GLOBAL Pre 24.13 %         Scores of 19 and below usually indicate a poorer quality of life in these areas.  A difference of  2-3 points is a clinically meaningful difference.  A difference of 2-3 points in the total score of the Quality of Life Index has been associated with significant improvement in overall quality of life, self-image, physical symptoms, and general health in studies assessing change in quality of life.  PHQ-9: Review Flowsheet       02/07/2024  Depression screen PHQ 2/9  Decreased Interest 1  Down, Depressed, Hopeless 0  PHQ - 2 Score 1   Altered sleeping 1  Tired, decreased energy 1  Change in appetite 2  Feeling bad  or failure about yourself  1  Trouble concentrating 1  Moving slowly or fidgety/restless 0  Suicidal thoughts 0  PHQ-9 Score 7  Difficult doing work/chores Not difficult at all   Interpretation of Total Score  Total Score Depression Severity:  1-4 = Minimal depression, 5-9 = Mild depression, 10-14 = Moderate depression, 15-19 = Moderately severe depression, 20-27 = Severe depression   Psychosocial Evaluation and Intervention:   Psychosocial Re-Evaluation:  Psychosocial Re-Evaluation     Row Name 02/15/24 1013 04/08/24 1332           Psychosocial Re-Evaluation   Current issues with Current Stress Concerns Current Stress Concerns      Comments Pericles has not voiced any increased concerns or stressors during exercise at cardiac rehab. Aariz has not voiced any increased concerns or stressors during exercise at cardiac rehab.      Expected Outcomes Kennard will have controled or decreased stressors upon completion of cardiac rehab Wolf will have controled or decreased stressors upon completion of cardiac rehab      Interventions Encouraged to attend Pulmonary Rehabilitation for the exercise;Encouraged to attend Cardiac Rehabilitation for the exercise;Stress management education Encouraged to attend Pulmonary Rehabilitation for the exercise;Encouraged to attend Cardiac Rehabilitation for the exercise;Stress management education      Continue Psychosocial Services  -- No Follow up required        Initial Review   Source of Stress Concerns Financial;Occupation --      Comments Patient is eager to get back to work --         Psychosocial Discharge (Final Psychosocial Re-Evaluation):  Psychosocial Re-Evaluation - 04/08/24 1332       Psychosocial Re-Evaluation   Current issues with Current Stress Concerns    Comments Jayton has not voiced any increased concerns or stressors during exercise at cardiac  rehab.    Expected Outcomes Avrom will have controled or decreased stressors upon completion of cardiac rehab    Interventions Encouraged to attend Pulmonary Rehabilitation for the exercise;Encouraged to attend Cardiac Rehabilitation for the exercise;Stress management education    Continue Psychosocial Services  No Follow up required          Vocational Rehabilitation: Provide vocational rehab assistance to qualifying candidates.   Vocational Rehab Evaluation & Intervention:  Vocational Rehab - 02/07/24 1027       Initial Vocational Rehab Evaluation & Intervention   Assessment shows need for Vocational Rehabilitation No   He has a job, just waiting on cardiology clearance for return date         Education: Education Goals: Education classes will be provided on a variety of topics geared toward better understanding of heart health and risk factor modification. Participant will state understanding/return demonstration of topics presented as noted by education test scores.  Learning Barriers/Preferences:  Learning Barriers/Preferences - 02/07/24 0902       Learning Barriers/Preferences   Learning Barriers None    Learning Preferences Skilled Demonstration;Individual Instruction;Group Instruction          General Cardiac Education Topics:  AED/CPR: - Group verbal and written instruction with the use of models to demonstrate the basic use of the AED with the basic ABC's of resuscitation.   Test and Procedures: - Group verbal and visual presentation and models provide information about basic cardiac anatomy and function. Reviews the testing methods done to diagnose heart disease and the outcomes of the test results. Describes the treatment choices: Medical Management, Angioplasty, or Coronary Bypass Surgery for treating various heart  conditions including Myocardial Infarction, Angina, Valve Disease, and Cardiac Arrhythmias. Written material provided at class  time.   Medication Safety: - Group verbal and visual instruction to review commonly prescribed medications for heart and lung disease. Reviews the medication, class of the drug, and side effects. Includes the steps to properly store meds and maintain the prescription regimen. Written material provided at class time.   Intimacy: - Group verbal instruction through game format to discuss how heart and lung disease can affect sexual intimacy. Written material provided at class time.   Know Your Numbers and Heart Failure: - Group verbal and visual instruction to discuss disease risk factors for cardiac and pulmonary disease and treatment options.  Reviews associated critical values for Overweight/Obesity, Hypertension, Cholesterol, and Diabetes.  Discusses basics of heart failure: signs/symptoms and treatments.  Introduces Heart Failure Zone chart for action plan for heart failure. Written material provided at class time.   Infection Prevention: - Provides verbal and written material to individual with discussion of infection control including proper hand washing and proper equipment cleaning during exercise session.   Falls Prevention: - Provides verbal and written material to individual with discussion of falls prevention and safety.   Other: -Provides group and verbal instruction on various topics (see comments)   Knowledge Questionnaire Score:  Knowledge Questionnaire Score - 02/07/24 1630       Knowledge Questionnaire Score   Pre Score 21/24          Core Components/Risk Factors/Patient Goals at Admission:  Personal Goals and Risk Factors at Admission - 02/07/24 1028       Core Components/Risk Factors/Patient Goals on Admission    Weight Management Yes;Obesity;Weight Loss    Intervention Weight Management: Develop a combined nutrition and exercise program designed to reach desired caloric intake, while maintaining appropriate intake of nutrient and fiber, sodium and fats,  and appropriate energy expenditure required for the weight goal.;Weight Management: Provide education and appropriate resources to help participant work on and attain dietary goals.;Weight Management/Obesity: Establish reasonable short term and long term weight goals.;Obesity: Provide education and appropriate resources to help participant work on and attain dietary goals.    Admit Weight 254 lb 13.6 oz (115.6 kg)    Expected Outcomes Short Term: Continue to assess and modify interventions until short term weight is achieved;Long Term: Adherence to nutrition and physical activity/exercise program aimed toward attainment of established weight goal;Weight Loss: Understanding of general recommendations for a balanced deficit meal plan, which promotes 1-2 lb weight loss per week and includes a negative energy balance of [phone] kcal/d;Understanding recommendations for meals to include 15-35% energy as protein, 25-35% energy from fat, 35-60% energy from carbohydrates, less than 200mg  of dietary cholesterol, 20-35 gm of total fiber daily;Understanding of distribution of calorie intake throughout the day with the consumption of 4-5 meals/snacks    Heart Failure Yes    Intervention Provide a combined exercise and nutrition program that is supplemented with education, support and counseling about heart failure. Directed toward relieving symptoms such as shortness of breath, decreased exercise tolerance, and extremity edema.    Expected Outcomes Improve functional capacity of life;Short term: Attendance in program 2-3 days a week with increased exercise capacity. Reported lower sodium intake. Reported increased fruit and vegetable intake. Reports medication compliance.;Short term: Daily weights obtained and reported for increase. Utilizing diuretic protocols set by physician.;Long term: Adoption of self-care skills and reduction of barriers for early signs and symptoms recognition and intervention leading to self-care  maintenance.  Hypertension Yes    Intervention Provide education on lifestyle modifcations including regular physical activity/exercise, weight management, moderate sodium restriction and increased consumption of fresh fruit, vegetables, and low fat dairy, alcohol moderation, and smoking cessation.;Monitor prescription use compliance.    Expected Outcomes Short Term: Continued assessment and intervention until BP is < 140/61mm HG in hypertensive participants. < 130/65mm HG in hypertensive participants with diabetes, heart failure or chronic kidney disease.;Long Term: Maintenance of blood pressure at goal levels.    Lipids Yes    Intervention Provide education and support for participant on nutrition & aerobic/resistive exercise along with prescribed medications to achieve LDL 70mg , HDL >40mg .    Expected Outcomes Short Term: Participant states understanding of desired cholesterol values and is compliant with medications prescribed. Participant is following exercise prescription and nutrition guidelines.;Long Term: Cholesterol controlled with medications as prescribed, with individualized exercise RX and with personalized nutrition plan. Value goals: LDL < 70mg , HDL > 40 mg.    Stress Yes    Intervention Offer individual and/or small group education and counseling on adjustment to heart disease, stress management and health-related lifestyle change. Teach and support self-help strategies.;Refer participants experiencing significant psychosocial distress to appropriate mental health specialists for further evaluation and treatment. When possible, include family members and significant others in education/counseling sessions.    Expected Outcomes Short Term: Participant demonstrates changes in health-related behavior, relaxation and other stress management skills, ability to obtain effective social support, and compliance with psychotropic medications if prescribed.;Long Term: Emotional wellbeing is  indicated by absence of clinically significant psychosocial distress or social isolation.    Personal Goal Other Yes    Personal Goal Plan for healthy snacking, Back to work, Independance, endurance    Intervention Will continue to monitor pt and progress workloads as tolerated without sign or symptom    Expected Outcomes Pt will achieve his goals and increase strength          Education:Diabetes - Individual verbal and written instruction to review signs/symptoms of diabetes, desired ranges of glucose level fasting, after meals and with exercise. Acknowledge that pre and post exercise glucose checks will be done for 3 sessions at entry of program.   Core Components/Risk Factors/Patient Goals Review:   Goals and Risk Factor Review     Row Name 02/15/24 1015 03/14/24 1304 04/08/24 1333         Core Components/Risk Factors/Patient Goals Review   Personal Goals Review Weight Management/Obesity;Heart Failure;Hypertension;Lipids;Stress Weight Management/Obesity;Heart Failure;Hypertension;Lipids;Stress Weight Management/Obesity;Heart Failure;Hypertension;Lipids;Stress     Review Pt is doing well with exercise at cardiac rehab. Vital signs have been stable. Jsiah has begun to increase his met levels (2 sessions thus far) Pt is doing well with exercise at cardiac rehab. Vital signs have been stable. Chosen has increased his met levels within the last 30 days Pt is doing well with exercise at cardiac rehab. Vital signs have been stable. Herchel has maintained his met levels since switching from the exercise machines (03/31/24)     Expected Outcomes Pt will continue to particpate in cardiac rehab for exercise, nutrition and lifestyle modifications Pt will continue to particpate in cardiac rehab for exercise, nutrition and lifestyle modifications Pt will continue to particpate in cardiac rehab for exercise, nutrition and lifestyle modifications        Core Components/Risk Factors/Patient Goals at  Discharge (Final Review):   Goals and Risk Factor Review - 04/08/24 1333       Core Components/Risk Factors/Patient Goals Review   Personal Goals Review Weight  Management/Obesity;Heart Failure;Hypertension;Lipids;Stress    Review Pt is doing well with exercise at cardiac rehab. Vital signs have been stable. Mayford has maintained his met levels since switching from the exercise machines (03/31/24)    Expected Outcomes Pt will continue to particpate in cardiac rehab for exercise, nutrition and lifestyle modifications          ITP Comments:  ITP Comments     Row Name 02/07/24 0806 02/15/24 1011 03/14/24 1259 04/08/24 1331     ITP Comments Wilbert Bihari, MD: Medical Director.  Introduction to the [facility] / Intensive Cardiac Rehab.  Initial orientation packet reviewed with the patient. 30 Day ITP Review. Haralambos has good participation with exercise at cardiac rehab when in attendance 30 Day ITP Review. Chaun has good participation with exercise at cardiac rehab when in attendance 30 Day ITP Review. Chang continues to have good participation with exercise at cardiac rehab when in attendance       Comments: see ITP comments

## 2024-04-16 ENCOUNTER — Encounter (HOSPITAL_COMMUNITY)
Admission: RE | Admit: 2024-04-16 | Discharge: 2024-04-16 | Disposition: A | Source: Ambulatory Visit | Attending: Cardiovascular Disease

## 2024-04-16 DIAGNOSIS — Z955 Presence of coronary angioplasty implant and graft: Secondary | ICD-10-CM

## 2024-04-16 DIAGNOSIS — I2111 ST elevation (STEMI) myocardial infarction involving right coronary artery: Secondary | ICD-10-CM

## 2024-04-18 ENCOUNTER — Encounter (HOSPITAL_COMMUNITY)
Admission: RE | Admit: 2024-04-18 | Discharge: 2024-04-18 | Disposition: A | Discharge status: Home / Self Care | Source: Ambulatory Visit | Attending: Cardiovascular Disease | Admitting: Cardiovascular Disease

## 2024-04-18 DIAGNOSIS — I2111 ST elevation (STEMI) myocardial infarction involving right coronary artery: Secondary | ICD-10-CM | POA: Diagnosis not present

## 2024-04-18 DIAGNOSIS — Z955 Presence of coronary angioplasty implant and graft: Secondary | ICD-10-CM

## 2024-04-21 ENCOUNTER — Encounter (HOSPITAL_COMMUNITY): Admission: RE | Admit: 2024-04-21 | Source: Ambulatory Visit

## 2024-04-23 ENCOUNTER — Encounter (HOSPITAL_COMMUNITY)
Admission: RE | Admit: 2024-04-23 | Discharge: 2024-04-23 | Disposition: A | Source: Ambulatory Visit | Attending: Cardiovascular Disease

## 2024-04-23 DIAGNOSIS — I2111 ST elevation (STEMI) myocardial infarction involving right coronary artery: Secondary | ICD-10-CM | POA: Diagnosis not present

## 2024-04-23 DIAGNOSIS — Z955 Presence of coronary angioplasty implant and graft: Secondary | ICD-10-CM

## 2024-04-25 ENCOUNTER — Encounter (HOSPITAL_COMMUNITY)
Admission: RE | Admit: 2024-04-25 | Discharge: 2024-04-25 | Disposition: A | Source: Ambulatory Visit | Attending: Cardiovascular Disease

## 2024-04-25 DIAGNOSIS — I2111 ST elevation (STEMI) myocardial infarction involving right coronary artery: Secondary | ICD-10-CM

## 2024-04-25 DIAGNOSIS — Z955 Presence of coronary angioplasty implant and graft: Secondary | ICD-10-CM

## 2024-04-28 ENCOUNTER — Encounter (HOSPITAL_COMMUNITY)
Admission: RE | Admit: 2024-04-28 | Discharge: 2024-04-28 | Disposition: A | Discharge status: Home / Self Care | Source: Ambulatory Visit | Attending: Cardiovascular Disease | Admitting: Cardiovascular Disease

## 2024-04-28 DIAGNOSIS — I2111 ST elevation (STEMI) myocardial infarction involving right coronary artery: Secondary | ICD-10-CM | POA: Diagnosis not present

## 2024-04-28 DIAGNOSIS — Z955 Presence of coronary angioplasty implant and graft: Secondary | ICD-10-CM

## 2024-04-28 NOTE — Progress Notes (Signed)
 Discharge Progress Report  Patient Details  Name: Ricky Bailey MRN: [id number] Date of Birth: [birth year] Referring Provider:   Flowsheet Row INTENSIVE CARDIAC REHAB ORIENT from 02/07/2024 in [facility]  Referring Provider Dr. Lonni Cash MD     Number of Visits: 32  Reason for Discharge:  Patient reached a stable level of exercise. Patient independent in their exercise. Patient has met program and personal goals.  Smoking History:  Social History   Tobacco Use  Smoking Status Former   Current packs/day: 0.00   Types: Cigarettes   Quit date: 07/21/1977   Years since quitting: 46.8  Smokeless Tobacco Never    Diagnosis:  11/20/23 ST elevation myocardial infarction involving right coronary artery (HCC)  11/20/23 Status post coronary artery stent placement RCA  ADL [facility]:   Initial Exercise Prescription:  Initial Exercise Prescription - 02/07/24 1000       Date of Initial Exercise RX and Referring Provider   Date 02/07/24    Referring Provider Dr. Lonni Cash MD    Expected Discharge Date 04/30/24      Recumbant Bike   Level 1    RPM 50    Watts 15    Minutes 15    METs 1.8      NuStep   Level 1    SPM 75    Minutes 15    METs 1.9      Prescription Details   Frequency (times per week) 3    Duration Progress to 30 minutes of continuous aerobic without signs/symptoms of physical distress      Intensity   THRR 40-80% of Max Heartrate 61-122    Ratings of Perceived Exertion 11-13    Perceived Dyspnea 0-4      Progression   Progression Continue progressive overload as per policy without signs/symptoms or physical distress.      Resistance Training   Training Prescription Yes    Weight 3    Reps 10-15          Discharge Exercise Prescription (Final Exercise Prescription Changes):  Exercise Prescription Changes - 04/07/24 0901       Response to Exercise   Blood Pressure  (Admit) 122/84    Blood Pressure (Exit) 96/78    Heart Rate (Admit) 68 bpm    Heart Rate (Exercise) 112 bpm    Heart Rate (Exit) 73 bpm    Rating of Perceived Exertion (Exercise) 11    Perceived Dyspnea (Exercise) 0    Symptoms None    Comments REVD MET's    Duration Progress to 30 minutes of  aerobic without signs/symptoms of physical distress    Intensity THRR unchanged      Progression   Progression Continue to progress workloads to maintain intensity without signs/symptoms of physical distress.    Average METs 2.8      Resistance Training   Training Prescription Yes    Weight 3    Reps 10-15    Time 10 Minutes      Recumbant Bike   Level 4    RPM 67    Watts 51    Minutes 15    METs 3.2      Recumbant Elliptical   Level 1    RPM 51    Watts 67    Minutes 15    METs 2.4      Home Exercise Plan   Plans to continue exercise at [employer] (comment)  Frequency Add 2 additional days to program exercise sessions.    Initial Home Exercises Provided 02/25/24          Functional Capacity:  6 Minute Walk     Row Name 02/07/24 1020         6 Minute Walk   Phase Initial     Distance 1200 feet     Walk Time 6 minutes     # of Rest Breaks 0     MPH 2.27     METS 2.53     RPE 11     Perceived Dyspnea  0     VO2 Peak 8.8     Symptoms Yes (comment)     Comments Multi-focal PVC's, bigeminal runs, patietn remained asymptomatic     Resting HR 71 bpm     Resting BP 112/68     Resting Oxygen Saturation  97 %     Exercise Oxygen Saturation  during 6 min walk 100 %     Max Ex. HR 99 bpm     Max Ex. BP 138/78     2 Minute Post BP 118/78        Psychological, QOL, Others - Outcomes: PHQ 2/9:    02/07/2024    9:47 AM  Depression screen PHQ 2/9  Decreased Interest 1  Down, Depressed, Hopeless 0  PHQ - 2 Score 1  Altered sleeping 1  Tired, decreased energy 1  Change in appetite 2  Feeling bad or failure about yourself  1  Trouble concentrating 1   Moving slowly or fidgety/restless 0  Suicidal thoughts 0  PHQ-9 Score 7  Difficult doing work/chores Not difficult at all    Quality of Life:  Quality of Life - 02/07/24 1016       Quality of Life   Select Quality of Life      Quality of Life Scores   Health/Function Pre 25.5 %    Socioeconomic Pre 18.81 %    Psych/Spiritual Pre 24.86 %    Family Pre 27.5 %    GLOBAL Pre 24.13 %          Personal Goals: Goals established at orientation with interventions provided to work toward goal.  Personal Goals and Risk Factors at Admission - 02/07/24 1028       Core Components/Risk Factors/Patient Goals on Admission    Weight Management Yes;Obesity;Weight Loss    Intervention Weight Management: Develop a combined nutrition and exercise program designed to reach desired caloric intake, while maintaining appropriate intake of nutrient and fiber, sodium and fats, and appropriate energy expenditure required for the weight goal.;Weight Management: Provide education and appropriate resources to help participant work on and attain dietary goals.;Weight Management/Obesity: Establish reasonable short term and long term weight goals.;Obesity: Provide education and appropriate resources to help participant work on and attain dietary goals.    Admit Weight 254 lb 13.6 oz (115.6 kg)    Expected Outcomes Short Term: Continue to assess and modify interventions until short term weight is achieved;Long Term: Adherence to nutrition and physical activity/exercise program aimed toward attainment of established weight goal;Weight Loss: Understanding of general recommendations for a balanced deficit meal plan, which promotes 1-2 lb weight loss per week and includes a negative energy balance of [phone] kcal/d;Understanding recommendations for meals to include 15-35% energy as protein, 25-35% energy from fat, 35-60% energy from carbohydrates, less than 200mg  of dietary cholesterol, 20-35 gm of total fiber  daily;Understanding of distribution of calorie intake throughout the  day with the consumption of 4-5 meals/snacks    Heart Failure Yes    Intervention Provide a combined exercise and nutrition program that is supplemented with education, support and counseling about heart failure. Directed toward relieving symptoms such as shortness of breath, decreased exercise tolerance, and extremity edema.    Expected Outcomes Improve functional capacity of life;Short term: Attendance in program 2-3 days a week with increased exercise capacity. Reported lower sodium intake. Reported increased fruit and vegetable intake. Reports medication compliance.;Short term: Daily weights obtained and reported for increase. Utilizing diuretic protocols set by physician.;Long term: Adoption of self-care skills and reduction of barriers for early signs and symptoms recognition and intervention leading to self-care maintenance.    Hypertension Yes    Intervention Provide education on lifestyle modifcations including regular physical activity/exercise, weight management, moderate sodium restriction and increased consumption of fresh fruit, vegetables, and low fat dairy, alcohol moderation, and smoking cessation.;Monitor prescription use compliance.    Expected Outcomes Short Term: Continued assessment and intervention until BP is < 140/71mm HG in hypertensive participants. < 130/8mm HG in hypertensive participants with diabetes, heart failure or chronic kidney disease.;Long Term: Maintenance of blood pressure at goal levels.    Lipids Yes    Intervention Provide education and support for participant on nutrition & aerobic/resistive exercise along with prescribed medications to achieve LDL 70mg , HDL >40mg .    Expected Outcomes Short Term: Participant states understanding of desired cholesterol values and is compliant with medications prescribed. Participant is following exercise prescription and nutrition guidelines.;Long Term:  Cholesterol controlled with medications as prescribed, with individualized exercise RX and with personalized nutrition plan. Value goals: LDL < 70mg , HDL > 40 mg.    Stress Yes    Intervention Offer individual and/or small group education and counseling on adjustment to heart disease, stress management and health-related lifestyle change. Teach and support self-help strategies.;Refer participants experiencing significant psychosocial distress to appropriate mental health specialists for further evaluation and treatment. When possible, include family members and significant others in education/counseling sessions.    Expected Outcomes Short Term: Participant demonstrates changes in health-related behavior, relaxation and other stress management skills, ability to obtain effective social support, and compliance with psychotropic medications if prescribed.;Long Term: Emotional wellbeing is indicated by absence of clinically significant psychosocial distress or social isolation.    Personal Goal Other Yes    Personal Goal Plan for healthy snacking, Back to work, Independance, endurance    Intervention Will continue to monitor pt and progress workloads as tolerated without sign or symptom    Expected Outcomes Pt will achieve his goals and increase strength           Personal Goals Discharge:  Goals and Risk Factor Review     Row Name 02/15/24 1015 03/14/24 1304 04/08/24 1333         Core Components/Risk Factors/Patient Goals Review   Personal Goals Review Weight Management/Obesity;Heart Failure;Hypertension;Lipids;Stress Weight Management/Obesity;Heart Failure;Hypertension;Lipids;Stress Weight Management/Obesity;Heart Failure;Hypertension;Lipids;Stress     Review Pt is doing well with exercise at cardiac rehab. Vital signs have been stable. Lytle has begun to increase his met levels (2 sessions thus far) Pt is doing well with exercise at cardiac rehab. Vital signs have been stable. Dushaun has  increased his met levels within the last 30 days Pt is doing well with exercise at cardiac rehab. Vital signs have been stable. Yoandri has maintained his met levels since switching from the exercise machines (03/31/24)     Expected Outcomes Pt will continue to particpate in  cardiac rehab for exercise, nutrition and lifestyle modifications Pt will continue to particpate in cardiac rehab for exercise, nutrition and lifestyle modifications Pt will continue to particpate in cardiac rehab for exercise, nutrition and lifestyle modifications        Exercise Goals and Review:  Exercise Goals     Row Name 02/07/24 0808             Exercise Goals   Increase Physical Activity Yes       Intervention Provide advice, education, support and counseling about physical activity/exercise needs.;Develop an individualized exercise prescription for aerobic and resistive training based on initial evaluation findings, risk stratification, comorbidities and participant's personal goals.       Expected Outcomes Short Term: Attend rehab on a regular basis to increase amount of physical activity.;Long Term: Exercising regularly at least 3-5 days a week.;Long Term: Add in home exercise to make exercise part of routine and to increase amount of physical activity.       Increase Strength and Stamina Yes       Intervention Provide advice, education, support and counseling about physical activity/exercise needs.;Develop an individualized exercise prescription for aerobic and resistive training based on initial evaluation findings, risk stratification, comorbidities and participant's personal goals.       Expected Outcomes Short Term: Increase workloads from initial exercise prescription for resistance, speed, and METs.;Short Term: Perform resistance training exercises routinely during rehab and add in resistance training at home;Long Term: Improve cardiorespiratory fitness, muscular endurance and strength as measured by increased  METs and functional capacity ( )       Able to understand and use rate of perceived exertion (RPE) scale Yes       Intervention Provide education and explanation on how to use RPE scale       Expected Outcomes Short Term: Able to use RPE daily in rehab to express subjective intensity level;Long Term:  Able to use RPE to guide intensity level when exercising independently       Knowledge and understanding of Target Heart Rate Range (THRR) Yes       Intervention Provide education and explanation of THRR including how the numbers were predicted and where they are located for reference       Expected Outcomes Short Term: Able to state/look up THRR;Long Term: Able to use THRR to govern intensity when exercising independently;Short Term: Able to use daily as guideline for intensity in rehab       Understanding of Exercise Prescription Yes       Intervention Provide education, explanation, and written materials on patient's individual exercise prescription       Expected Outcomes Short Term: Able to explain program exercise prescription;Long Term: Able to explain home exercise prescription to exercise independently          Exercise Goals Re-Evaluation:  Exercise Goals Re-Evaluation     Row Name 02/11/24 0830 02/25/24 0842 03/24/24 0841         Exercise Goal Re-Evaluation   Exercise Goals Review Increase Physical Activity;Understanding of Exercise Prescription;Increase Strength and Stamina;Knowledge and understanding of Target Heart Rate Range (THRR);Able to understand and use rate of perceived exertion (RPE) scale Increase Physical Activity;Understanding of Exercise Prescription;Increase Strength and Stamina;Knowledge and understanding of Target Heart Rate Range (THRR);Able to understand and use rate of perceived exertion (RPE) scale Increase Physical Activity;Understanding of Exercise Prescription;Increase Strength and Stamina;Knowledge and understanding of Target Heart Rate Range (THRR);Able to  understand and use rate of perceived exertion (RPE) scale  Comments Pt first day in the [facility] program. Pt tolerated exercise well with an average MET level of 2.0. Pt is off to a good start and is learning his THRR, RPE and ExRx Reviewed MET's, goals and home ExRx. Pt tolerated exercise well with an average MET level of 2.25. He is doing well and already feels an increase in stamina. He is already exercising at [facility] and walkign 2 days for 30-45 mins per session. Talked about ways to increase MET's and he tried increasing his depth of step on the Nustep he went from 1.9 to 2.5 MET's. He's doing great Reviewed MET's, and goals. Pt tolerated exercise well with an average MET level of 2.35. He is feeling good with exercise and is making gradual increases. Will try octane on next visit to boost endurance more. He is also making better food choices and feels good about his goals     Expected Outcomes Will continue to monitor pt and progress workloads as tolerated without sign or symptom Will continue to monitor pt and progress workloads as tolerated without sign or symptom Will continue to monitor pt and progress workloads as tolerated without sign or symptom        Nutrition & Weight - Outcomes:  Pre Biometrics - 02/07/24 1025       Pre Biometrics   Height 5' 11 (1.803 m)    Weight 115.6 kg    Waist Circumference 45 inches    Hip Circumference 46 inches    Waist to Hip Ratio 0.98 %    BMI (Calculated) 35.56    Triceps Skinfold 24 mm    % Body Fat 34.4 %    Grip Strength 36 kg    Flexibility --   Patient unable to reach   Single Leg Stand 16 seconds           Nutrition:  Nutrition Therapy & Goals - 02/11/24 1026       Nutrition Therapy   Diet Heart Healthy Diet    Drug/Food Interactions Statins/Certain Fruits      Personal Nutrition Goals   Nutrition Goal Patient to identify strategies for reducing cardiovascular risk by attending the [facility] education and  nutrition series weekly.    Personal Goal #2 Patient to improve diet quality by using the plate method as a guide for meal planning to include lean protein/plant protein, fruits, vegetables, whole grains, nonfat dairy as part of a well-balanced diet.    Comments Patient has medical history of HTN, CAD, STEMI, hypercholesteremia. He has started making some dietary changes and reduced sodium intake. LDL is not at goal. A1c is in a pre-diabetic range. Patient will benefit from participation in intensive cardiac rehab for nutrition education, exercise, and lifestyle modification.      Intervention Plan   Intervention Prescribe, educate and counsel regarding individualized specific dietary modifications aiming towards targeted core components such as weight, hypertension, lipid management, diabetes, heart failure and other comorbidities.;Nutrition handout(s) given to patient.    Expected Outcomes Short Term Goal: Understand basic principles of dietary content, such as calories, fat, sodium, cholesterol and nutrients.;Long Term Goal: Adherence to prescribed nutrition plan.          Nutrition Discharge:  Nutrition Assessments - 02/25/24 1012       Rate Your Plate Scores   Pre Score 52          Education Questionnaire Score:  Knowledge Questionnaire Score - 02/07/24 1630       Knowledge Questionnaire  Score   Pre Score 21/24         Pt graduates from  Intensive/Traditional cardiac rehab program today with completion of 32 exercise and education sessions. Pt maintained good attendance and progressed nicely during their participation in rehab as evidenced by increased MET level.   Medication list reconciled. Repeat  PHQ score-3.  Pt has made significant lifestyle changes and should be commended for his success.  Deeric achieved his goals during cardiac rehab.   Pt plans to continue exercise at the [facility].  Goals reviewed with patient; copy given to patient.

## 2024-04-29 ENCOUNTER — Other Ambulatory Visit: Payer: Self-pay

## 2024-04-30 ENCOUNTER — Encounter (HOSPITAL_COMMUNITY)
Admission: RE | Admit: 2024-04-30 | Discharge: 2024-04-30 | Disposition: A | Discharge status: Home / Self Care | Source: Ambulatory Visit | Attending: Cardiovascular Disease | Admitting: Cardiovascular Disease

## 2024-04-30 VITALS — Ht 71.0 in | Wt 251.3 lb

## 2024-04-30 DIAGNOSIS — Z955 Presence of coronary angioplasty implant and graft: Secondary | ICD-10-CM

## 2024-04-30 DIAGNOSIS — I2111 ST elevation (STEMI) myocardial infarction involving right coronary artery: Secondary | ICD-10-CM | POA: Diagnosis not present

## 2024-05-05 ENCOUNTER — Other Ambulatory Visit: Payer: Self-pay

## 2024-05-06 ENCOUNTER — Other Ambulatory Visit: Payer: Self-pay

## 2024-05-19 ENCOUNTER — Encounter (HOSPITAL_COMMUNITY): Admitting: Internal Medicine

## 2024-05-21 ENCOUNTER — Encounter (HOSPITAL_COMMUNITY): Payer: Self-pay | Admitting: Internal Medicine

## 2024-05-21 ENCOUNTER — Ambulatory Visit (HOSPITAL_COMMUNITY)
Admission: RE | Admit: 2024-05-21 | Discharge: 2024-05-21 | Disposition: A | Discharge status: Home / Self Care | Source: Ambulatory Visit | Attending: Internal Medicine | Admitting: Internal Medicine

## 2024-05-21 ENCOUNTER — Other Ambulatory Visit: Payer: Self-pay

## 2024-05-21 VITALS — BP 120/88 | HR 81 | Wt 255.8 lb

## 2024-05-21 DIAGNOSIS — I714 Abdominal aortic aneurysm, without rupture, unspecified: Secondary | ICD-10-CM | POA: Diagnosis not present

## 2024-05-21 DIAGNOSIS — I5022 Chronic systolic (congestive) heart failure: Secondary | ICD-10-CM | POA: Insufficient documentation

## 2024-05-21 DIAGNOSIS — I2510 Atherosclerotic heart disease of native coronary artery without angina pectoris: Secondary | ICD-10-CM | POA: Insufficient documentation

## 2024-05-21 DIAGNOSIS — I7121 Aneurysm of the ascending aorta, without rupture: Secondary | ICD-10-CM | POA: Diagnosis not present

## 2024-05-21 DIAGNOSIS — I472 Ventricular tachycardia, unspecified: Secondary | ICD-10-CM | POA: Insufficient documentation

## 2024-05-21 DIAGNOSIS — I252 Old myocardial infarction: Secondary | ICD-10-CM | POA: Diagnosis not present

## 2024-05-21 DIAGNOSIS — Z79899 Other long term (current) drug therapy: Secondary | ICD-10-CM | POA: Insufficient documentation

## 2024-05-21 DIAGNOSIS — Z7982 Long term (current) use of aspirin: Secondary | ICD-10-CM | POA: Insufficient documentation

## 2024-05-21 DIAGNOSIS — I469 Cardiac arrest, cause unspecified: Secondary | ICD-10-CM

## 2024-05-21 DIAGNOSIS — Z8674 Personal history of sudden cardiac arrest: Secondary | ICD-10-CM | POA: Insufficient documentation

## 2024-05-21 DIAGNOSIS — Z7902 Long term (current) use of antithrombotics/antiplatelets: Secondary | ICD-10-CM | POA: Insufficient documentation

## 2024-05-21 DIAGNOSIS — I4901 Ventricular fibrillation: Secondary | ICD-10-CM

## 2024-05-21 MED ORDER — METOPROLOL SUCCINATE ER 25 MG PO TB24
25.0000 mg | ORAL_TABLET | Freq: Every day | ORAL | 2 refills | Status: AC
  Filled 2024-05-21 – 2024-06-02 (×3): qty 90, 90d supply, fill #0

## 2024-05-21 NOTE — Patient Instructions (Signed)
 Good to see you today!   START Toprol  25 mg  (1 tablet) daily  Your physician recommends that you schedule a follow-up appointment as scheduled  If you have any questions or concerns before your next appointment please send us  a message through [place] or call our office at [phone].    TO LEAVE A MESSAGE FOR THE NURSE SELECT OPTION 2, PLEASE LEAVE A MESSAGE INCLUDING: YOUR NAME DATE OF BIRTH CALL BACK NUMBER REASON FOR CALL**this is important as we prioritize the call backs  YOU WILL RECEIVE A CALL BACK THE SAME DAY AS LONG AS YOU CALL BEFORE 4:00 PM At the [facility], you and your health needs are our priority. As part of our continuing mission to provide you with exceptional heart care, we have created designated Provider Care Teams. These Care Teams include your primary Cardiologist (physician) and Advanced Practice Providers (APPs- Physician Assistants and Nurse Practitioners) who all work together to provide you with the care you need, when you need it.   You may see any of the following providers on your designated Care Team at your next follow up: Dr Toribio Fuel Dr Ezra Shuck Dr. Morene Brownie Greig Mosses, NP Caffie Shed, [place] [facility] [place], [place] Beckey Coe, NP Jordan Lee, NP Ellouise Class, NP Tinnie Redman, PharmD Jaun Bash, PharmD   Please be sure to bring in all your medications bottles to every appointment.    Thank you for choosing [facility]

## 2024-05-21 NOTE — Progress Notes (Addendum)
 ADVANCED HF CLINIC NOTE   Primary Care: Tarry Donne Aurora, MD HF Cardiologist: Dr. Cherrie  HPI: Ricky Bailey is a 68 y.o. male with CAD, hx VF arrest, AAA, depression, HTN, HLD, obesity, BPH and OSA.   Admitted 11/20/23 w/ acute inferior STEMI. He had VF arrest in the cath lab treated w/ Defib x 1, started on amio gtt. Emergent cath showed thrombotic occlusion of a very large d RCA, treated w/ balloon angioplasty and aspiration thrombectomy. Due to large vessel size and thrombus burden, no stent was placed. Later developed CP and ST elevations. Urgent re-look angiography 05/14 showed prox occlusion of the RCA. Revascularization attempted but extremely challenging. Despite attempts, had 99% residual stenosis throughout the vessel w/ no flow in the distal bed.  Procedure aborted given completed infarct. Initial echo 11/22/23 showed EF 60-65%, RV mildly reduced. On 5/16, pt developed hypotension and CP w/ new anteroseptal Q waves. Repeat ltd echo showed drop in EF down to 35-40%, RV moderately reduced and HK. Went for coronary angiography which showed persistent total occlusion of the prox RCA and ectatic LAD w/ evidence of new plaque rupture with 99% mid vessel stenosis and 60% segmental stenosis just proximal to the culprit. Both lesions were treated successfully w/ DES. RHC c/w RV dominant shock (RA 14, PA 28/6, mPCW 11, CO 4 L/min, CI 1.68 L/min/m, PAPi 1.5). Started on DBA gtts. Repeat echo showed EF 30-35%, RV moderately reduced. DBA gtt eventually weaned and tolerated GDMT. Lifevest ordered at d/c.   Echo 02/11/24 showed EF 40-45%, G1DD, RV moderately reduced.  Here for f/u. Has completed CR. Working [employer]. Denies CP or SOB. Compliant with meds. No edema. No low BPs.   Cardiac Studies - Echo 02/11/24: EF 40-45%, RV moderately reduced   - Ltd echo 11/27/23: EF 30-35%, interventricular septum flattened in systole, RV moderately reduced  - R/LHC 11/23/23: RA 14, PA 28/6 (21), PCWP 11, CO/Ci  (Fick) 4/1.68; Ectatic LAD with evidence of new plaque rupture, 99% mid vessel stenosis and 60% segmental stenosis just proximal to the culprit lesion, both lesions treated successfully with drug-eluting stent   - Ltd echo 11/23/23: EF down to 35-40%, RV moderately reduced and HK.   - Echo 11/22/23: EF 60-65%, RV mildly reduced.  - LHC 11/21/23: prox occlusion of the RCA. Revascularization attempted but extremely challenging. Despite attempts, had 99% residual stenosis throughout the vessel w/ no flow in the distal bed.  Procedure aborted given completed infarct.  - LHC 11/20/23: balloon angioplasty and aspiration thrombectomy of dRCA, no stent  Past Medical History:  Diagnosis Date   Back pain    BPH (benign prostatic hyperplasia)    Gout    Hyperlipidemia    Hypertension    Obesity    Sleep apnea    Vertigo 03/28/2018   with vommiting   Current Outpatient Medications  Medication Sig Dispense Refill   allopurinol  (ZYLOPRIM ) 300 MG tablet Take 0.5 tablets (150 mg total) by mouth daily. 30 tablet 0   aspirin  EC 81 MG tablet Take 1 tablet (81 mg total) by mouth daily. Swallow whole. 30 tablet 12   dapagliflozin  propanediol (FARXIGA ) 10 MG TABS tablet Take 1 tablet (10 mg total) by mouth daily. 30 tablet 11   finasteride  (PROSCAR ) 5 MG tablet Take 1 tablet (5 mg total) by mouth daily. 30 tablet 0   potassium chloride  SA (KLOR-CON  M) 20 MEQ tablet Take 1 tablet as needed when taking Torsemide  30 tablet 3   prasugrel  (EFFIENT ) 10  MG TABS tablet Take 1 tablet (10 mg total) by mouth daily. 30 tablet 3   rosuvastatin  (CRESTOR ) 40 MG tablet Take 1 tablet (40 mg total) by mouth daily. 30 tablet 3   sacubitril -valsartan  (ENTRESTO ) 49-51 MG Take 1 tablet by mouth 2 (two) times daily. 180 tablet 3   spironolactone  (ALDACTONE ) 25 MG tablet Take 1 tablet (25 mg total) by mouth daily. 30 tablet 3   torsemide  (DEMADEX ) 20 MG tablet Take 1 tablet (20 mg total) by mouth daily as needed for leg edema,  weight gain, shortness of breath 30 tablet 3   No current facility-administered medications for this encounter.   No Known Allergies  Social History   Socioeconomic History   Marital status: Divorced    Spouse name: Not on file   Number of children: Not on file   Years of education: Not on file   Highest education level: Not on file  Occupational History   Not on file  Tobacco Use   Smoking status: Former    Current packs/day: 0.00    Types: Cigarettes    Quit date: 07/21/1977    Years since quitting: 46.8   Smokeless tobacco: Never  Vaping Use   Vaping status: Never Used  Substance and Sexual Activity   Alcohol use: Yes    Alcohol/week: 6.0 standard drinks of alcohol    Types: 6 Cans of beer per week   Drug use: No   Sexual activity: Not on file  Other Topics Concern   Not on file  Social History Narrative   Not on file   Social Drivers of Health   Financial Resource Strain: Medium Risk (02/13/2024)   Overall Financial Resource Strain (CARDIA)    Difficulty of Paying Living Expenses: Somewhat hard  Food Insecurity: No Food Insecurity (02/13/2024)   Hunger Vital Sign    Worried About Running Out of Food in the Last Year: Never true    Ran Out of Food in the Last Year: Never true  Transportation Needs: No Transportation Needs (11/20/2023)   PRAPARE - Administrator, Civil Service (Medical): No    Lack of Transportation (Non-Medical): No  Physical Activity: Not on file  Stress: Not on file  Social Connections: Moderately Isolated (11/20/2023)   Social Connection and Isolation Panel    Frequency of Communication with Friends and Family: More than three times a week    Frequency of Social Gatherings with Friends and Family: Once a week    Attends Religious Services: 1 to 4 times per year    Active Member of [employer] or Organizations: No    Attends Banker Meetings: Never    Marital Status: Divorced  Catering Manager Violence: Not At Risk  (11/20/2023)   Humiliation, Afraid, Rape, and Kick questionnaire    Fear of Current or Ex-Partner: No    Emotionally Abused: No    Physically Abused: No    Sexually Abused: No   Family History  Problem Relation Age of Onset   Heart disease Mother    Lung cancer Father    Colon polyps Neg Hx    Colon cancer Neg Hx    Esophageal cancer Neg Hx    Rectal cancer Neg Hx    Stomach cancer Neg Hx    Wt Readings from Last 3 Encounters:  05/21/24 116 kg (255 lb 12.8 oz)  04/25/24 114 kg (251 lb 5.2 oz)  02/13/24 114 kg (251 lb 6.4 oz)   BP 120/88  Pulse 81   Wt 116 kg (255 lb 12.8 oz)   SpO2 98%   BMI 35.68 kg/m   PHYSICAL EXAM: General:  Sitting up. No resp difficulty HEENT: normal Neck: supple. no JVD.  Cor: Regular rate & rhythm. No rubs, gallops or murmurs. Lungs: clear Abdomen: soft, nontender, nondistended.Good bowel sounds. Extremities: no cyanosis, clubbing, rash, edema Neuro: alert & orientedx3, cranial nerves grossly intact. moves all 4 extremities w/o difficulty. Affect pleasant  ASSESSMENT & PLAN: CAD  - with acute inferior STEMI and RV infarct 5/25 - LHC 11/20/23: thrombotic occlusion of large caliber RCA s/p balloon angio and aspiration thrombectomy. Minimal disease in LAD and LCX. C/B VF arrest in cath lab. - Repeat LHC 11/21/23: unsuccessful PCI. 99% RCA stenosis  - Recurrent CP and ECG changes on 11/23/23 -> cath with 99% LAD stenosis due to new ruptured plaque and occluded RCA -> DES x 2 to proximal LAD, no recurrent attempt on RCA.  RHC with severe RV failure shock. - No s/s angina - Continue ASA 81 mg  + Effient  10 mg + Crestor  40 mg daily. Dapt x 1 year - Completed CR   2.  Chronic Systolic Heart Failure - iCM, 2/2 STEMI w/ RV infarct - Echo 11/22/23: EF 60-65% - Echo 11/23/23: EF 35-40%, RV moderately down -  Ltd Echo 11/27/23: EF 30-35%, RV mod reduced systolic function.   - Echo 8/25: EF 40-45%, G1DD, RV moderately reduced - Doing well. NYHA I. Volume  ok - Continue torsemide  20 mg/20 KCL PRN - Continue Farxiga  10 mg daily.  - Continue Entresto  49/51 mg bid. - Continue spironolactone   25 mg daily.  - Start Toprol  25 mg daily - EF > 35%. No ICD at this time    3. 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 Although he had VF arrest in cath lab, cause was identified and reversible, and EF improved. Discussed with EP. ICD not indicated   4. Ascending Aortic Aneurysm - Measuring 4.2 mm - Stable - Will follow with echo   Toribio Fuel MD 05/21/24  [facility] [address] [facility] [place] [address] [phone] (office) [phone] (fax)

## 2024-05-30 ENCOUNTER — Other Ambulatory Visit: Payer: Self-pay

## 2024-06-02 ENCOUNTER — Other Ambulatory Visit: Payer: Self-pay

## 2024-06-03 ENCOUNTER — Other Ambulatory Visit: Payer: Self-pay

## 2024-07-03 ENCOUNTER — Other Ambulatory Visit (HOSPITAL_COMMUNITY): Payer: Self-pay | Admitting: Physician Assistant

## 2024-07-04 ENCOUNTER — Other Ambulatory Visit: Payer: Self-pay

## 2024-07-04 MED ORDER — SPIRONOLACTONE 25 MG PO TABS
25.0000 mg | ORAL_TABLET | Freq: Every day | ORAL | 3 refills | Status: AC
  Filled 2024-07-04: qty 30, 30d supply, fill #0
  Filled 2024-08-04: qty 30, 30d supply, fill #1

## 2024-07-04 MED ORDER — POTASSIUM CHLORIDE CRYS ER 20 MEQ PO TBCR
EXTENDED_RELEASE_TABLET | ORAL | 3 refills | Status: AC
  Filled 2024-07-04: qty 30, 30d supply, fill #0
  Filled 2024-08-04: qty 30, 30d supply, fill #1

## 2024-07-07 ENCOUNTER — Other Ambulatory Visit: Payer: Self-pay

## 2024-07-28 ENCOUNTER — Other Ambulatory Visit: Payer: Self-pay

## 2024-07-28 ENCOUNTER — Other Ambulatory Visit (HOSPITAL_COMMUNITY): Payer: Self-pay | Admitting: Internal Medicine

## 2024-07-28 MED ORDER — PRASUGREL HCL 10 MG PO TABS
10.0000 mg | ORAL_TABLET | Freq: Every day | ORAL | 3 refills | Status: AC
  Filled 2024-07-28: qty 30, 30d supply, fill #0

## 2024-07-28 MED ORDER — ROSUVASTATIN CALCIUM 40 MG PO TABS
40.0000 mg | ORAL_TABLET | Freq: Every day | ORAL | 3 refills | Status: AC
  Filled 2024-07-28: qty 30, 30d supply, fill #0

## 2024-08-04 ENCOUNTER — Other Ambulatory Visit: Payer: Self-pay

## 2024-08-06 ENCOUNTER — Other Ambulatory Visit: Payer: Self-pay

## 2024-08-07 ENCOUNTER — Other Ambulatory Visit: Payer: Self-pay

## 2024-08-11 ENCOUNTER — Encounter (HOSPITAL_COMMUNITY): Payer: Self-pay | Admitting: *Deleted

## 2024-09-17 ENCOUNTER — Ambulatory Visit (HOSPITAL_COMMUNITY)
